# Patient Record
Sex: Female | Born: 1965 | Race: Black or African American | Hispanic: No | Smoking: Never smoker
Health system: Southern US, Community
[De-identification: ages and names within clinical notes are randomized; demographics above are authoritative.]

## PROBLEM LIST (undated history)

## (undated) DIAGNOSIS — D649 Anemia, unspecified: Secondary | ICD-10-CM

## (undated) DIAGNOSIS — M419 Scoliosis, unspecified: Secondary | ICD-10-CM

## (undated) DIAGNOSIS — O99119 Other diseases of the blood and blood-forming organs and certain disorders involving the immune mechanism complicating pregnancy, unspecified trimester: Secondary | ICD-10-CM

## (undated) DIAGNOSIS — O1490 Unspecified pre-eclampsia, unspecified trimester: Secondary | ICD-10-CM

## (undated) DIAGNOSIS — G510 Bell's palsy: Secondary | ICD-10-CM

## (undated) DIAGNOSIS — D6851 Activated protein C resistance: Secondary | ICD-10-CM

## (undated) DIAGNOSIS — O039 Complete or unspecified spontaneous abortion without complication: Secondary | ICD-10-CM

## (undated) HISTORY — PX: BACK SURGERY: SHX140

---
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
## 2010-08-18 ENCOUNTER — Other Ambulatory Visit: Payer: Self-pay | Admitting: Family Medicine

## 2010-08-18 DIAGNOSIS — Z1231 Encounter for screening mammogram for malignant neoplasm of breast: Secondary | ICD-10-CM

## 2010-08-31 ENCOUNTER — Ambulatory Visit (HOSPITAL_COMMUNITY)
Admission: RE | Admit: 2010-08-31 | Discharge: 2010-08-31 | Disposition: A | Discharge status: Home / Self Care | Payer: Self-pay | Source: Ambulatory Visit | Attending: Family Medicine | Admitting: Family Medicine

## 2010-08-31 DIAGNOSIS — Z1231 Encounter for screening mammogram for malignant neoplasm of breast: Secondary | ICD-10-CM

## 2010-09-09 ENCOUNTER — Other Ambulatory Visit: Payer: Self-pay | Admitting: Family Medicine

## 2010-09-09 DIAGNOSIS — R928 Other abnormal and inconclusive findings on diagnostic imaging of breast: Secondary | ICD-10-CM

## 2010-09-10 ENCOUNTER — Ambulatory Visit (HOSPITAL_COMMUNITY): Payer: Self-pay

## 2010-09-14 ENCOUNTER — Ambulatory Visit
Admission: RE | Admit: 2010-09-14 | Discharge: 2010-09-14 | Disposition: A | Discharge status: Home / Self Care | Payer: Self-pay | Source: Ambulatory Visit | Attending: Family Medicine | Admitting: Family Medicine

## 2010-09-14 DIAGNOSIS — R928 Other abnormal and inconclusive findings on diagnostic imaging of breast: Secondary | ICD-10-CM

## 2010-11-01 ENCOUNTER — Encounter: Payer: Self-pay | Admitting: Podiatry

## 2012-02-09 ENCOUNTER — Encounter (HOSPITAL_BASED_OUTPATIENT_CLINIC_OR_DEPARTMENT_OTHER): Payer: Self-pay | Admitting: Student

## 2012-02-09 ENCOUNTER — Emergency Department (HOSPITAL_BASED_OUTPATIENT_CLINIC_OR_DEPARTMENT_OTHER): Payer: Self-pay

## 2012-02-09 ENCOUNTER — Emergency Department (HOSPITAL_BASED_OUTPATIENT_CLINIC_OR_DEPARTMENT_OTHER)
Admission: EM | Admit: 2012-02-09 | Discharge: 2012-02-09 | Disposition: A | Discharge status: Home / Self Care | Payer: Self-pay | Attending: Emergency Medicine | Admitting: Emergency Medicine

## 2012-02-09 DIAGNOSIS — Z8739 Personal history of other diseases of the musculoskeletal system and connective tissue: Secondary | ICD-10-CM | POA: Insufficient documentation

## 2012-02-09 DIAGNOSIS — Z862 Personal history of diseases of the blood and blood-forming organs and certain disorders involving the immune mechanism: Secondary | ICD-10-CM | POA: Insufficient documentation

## 2012-02-09 DIAGNOSIS — H5789 Other specified disorders of eye and adnexa: Secondary | ICD-10-CM | POA: Insufficient documentation

## 2012-02-09 DIAGNOSIS — Q674 Other congenital deformities of skull, face and jaw: Secondary | ICD-10-CM | POA: Insufficient documentation

## 2012-02-09 DIAGNOSIS — G510 Bell's palsy: Secondary | ICD-10-CM

## 2012-02-09 DIAGNOSIS — G839 Paralytic syndrome, unspecified: Secondary | ICD-10-CM | POA: Insufficient documentation

## 2012-02-09 HISTORY — DX: Anemia, unspecified: D64.9

## 2012-02-09 HISTORY — DX: Activated protein C resistance: O99.119

## 2012-02-09 HISTORY — DX: Complete or unspecified spontaneous abortion without complication: O03.9

## 2012-02-09 HISTORY — DX: Activated protein C resistance: D68.51

## 2012-02-09 HISTORY — DX: Scoliosis, unspecified: M41.9

## 2012-02-09 LAB — CBC WITH DIFFERENTIAL/PLATELET
Basophils Relative: 0 % (ref 0–1)
HCT: 31.8 % — ABNORMAL LOW (ref 36.0–46.0)
Hemoglobin: 10.3 g/dL — ABNORMAL LOW (ref 12.0–15.0)
Lymphocytes Relative: 26 % (ref 12–46)
Lymphs Abs: 1.3 10*3/uL (ref 0.7–4.0)
MCHC: 32.4 g/dL (ref 30.0–36.0)
Monocytes Absolute: 0.5 10*3/uL (ref 0.1–1.0)
Monocytes Relative: 11 % (ref 3–12)
Neutro Abs: 2.9 10*3/uL (ref 1.7–7.7)
Neutrophils Relative %: 61 % (ref 43–77)
RBC: 3.85 MIL/uL — ABNORMAL LOW (ref 3.87–5.11)

## 2012-02-09 LAB — COMPREHENSIVE METABOLIC PANEL
Alkaline Phosphatase: 69 U/L (ref 39–117)
BUN: 13 mg/dL (ref 6–23)
CO2: 26 mEq/L (ref 19–32)
Chloride: 103 mEq/L (ref 96–112)
Creatinine, Ser: 0.6 mg/dL (ref 0.50–1.10)
GFR calc Af Amer: >90 mL/min (ref >90)
GFR calc non Af Amer: >90 mL/min (ref >90)
Glucose, Bld: 108 mg/dL — ABNORMAL HIGH (ref 70–99)
Potassium: 3.9 mEq/L (ref 3.5–5.1)
Total Bilirubin: 0.3 mg/dL (ref 0.3–1.2)

## 2012-02-09 LAB — URINALYSIS, ROUTINE W REFLEX MICROSCOPIC
Bilirubin Urine: NEGATIVE
Ketones, ur: NEGATIVE mg/dL
Leukocytes, UA: NEGATIVE
Nitrite: NEGATIVE
Protein, ur: NEGATIVE mg/dL
pH: 7.5 (ref 5.0–8.0)

## 2012-02-09 LAB — TROPONIN I: Troponin I: <0.3 ng/mL (ref <0.30)

## 2012-02-09 MED ORDER — VALACYCLOVIR HCL 500 MG PO TABS
1000.0000 mg | ORAL_TABLET | Freq: Once | ORAL | Status: DC
  Filled 2012-02-09: qty 2

## 2012-02-09 MED ORDER — PREDNISONE 50 MG PO TABS: ORAL_TABLET | ORAL | Status: DC

## 2012-02-09 MED ORDER — ARTIFICIAL TEARS OP OINT: TOPICAL_OINTMENT | OPHTHALMIC | Status: DC | PRN

## 2012-02-09 MED ORDER — VALACYCLOVIR HCL 1 G PO TABS: 1000.0000 mg | ORAL_TABLET | Freq: Three times a day (TID) | ORAL | Status: AC

## 2012-02-09 MED ORDER — ACYCLOVIR 200 MG PO CAPS
800.0000 mg | ORAL_CAPSULE | Freq: Once | ORAL | Status: AC
  Administered 2012-02-09: 800 mg via ORAL

## 2012-02-09 MED ORDER — ACYCLOVIR 200 MG PO CAPS
ORAL_CAPSULE | ORAL | Status: AC
  Administered 2012-02-09: 800 mg via ORAL
  Filled 2012-02-09: qty 4

## 2012-02-09 MED ORDER — PREDNISONE 10 MG PO TABS
60.0000 mg | ORAL_TABLET | Freq: Once | ORAL | Status: AC
  Administered 2012-02-09: 60 mg via ORAL
  Filled 2012-02-09: qty 1

## 2012-02-09 NOTE — ED Notes (Signed)
MD at bedside. 

## 2012-02-09 NOTE — ED Provider Notes (Signed)
History     CSN: [id number]  Arrival date & time 02/09/12  1018   First MD Initiated Contact with Patient 02/09/12 1035      Chief Complaint  Patient presents with  . Numbness    (Consider location/radiation/quality/duration/timing/severity/associated sxs/prior treatment) HPI Comments: Patient complains of excessive tearing from her left eye that started around 5 PM last night along with tingling of her upper lip on the left side and difficulty saying certain words. The last time she was normal was 5 PM last night. 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  Sheshe used heparin in the past with her pregnancies to prevent clotting. denies any chest pain, shortness of breath, nausea or vomiting. she denies any weakness, numbness or tingling in her arms or legs. No difficulty walking, no headache, no dizziness. No difficulty swallowing. She's has no troubles thinking about what she wants to say.   The history is provided by the patient.    Past Medical History  Diagnosis Date  . Scoliosis   . Miscarriage   . Factor V Leiden mutation complicating pregnancy   . Anemia     Past Surgical History  Procedure Date  . Harrington rod     History reviewed. No pertinent family history.  History  Substance Use Topics  . Smoking status: Never Smoker   . Smokeless tobacco: Not on file  . Alcohol Use: Yes    OB History    Grav Para Term Preterm Abortions TAB SAB Ect Mult Living                  Review of Systems  Constitutional: Negative for fever, activity change and appetite change.  HENT: Negative for trouble swallowing.   Eyes: Positive for discharge. Negative for visual disturbance.  Respiratory: Negative for apnea, cough, chest tightness and shortness of breath.   Cardiovascular: Negative for chest pain.  Gastrointestinal: Negative for nausea and vomiting.  Musculoskeletal: Negative for back pain.  Skin: Negative for rash.  Neurological:  Positive for facial asymmetry and numbness. Negative for light-headedness.    Allergies  Penicillins  Home Medications  No current outpatient prescriptions on file.  BP 118/72  Pulse 68  Temp 98.5 F (36.9 C) (Oral)  SpO2 100%  LMP 01/20/2012  Physical Exam  Constitutional: She is oriented to person, place, and time. She appears well-developed and well-nourished. No distress.  HENT:  Head: Normocephalic and atraumatic.  Mouth/Throat: Oropharynx is clear and moist. No oropharyngeal exudate.       Left-sided facial droop, asymmetrical smile, unable to close left eye, tongue mid unable to elevate forehead on L.   Eyes: Conjunctivae normal and EOM are normal. Pupils are equal, round, and reactive to light.  Neck: Normal range of motion. Neck supple.  Cardiovascular: Normal rate, regular rhythm and normal heart sounds.   No murmur heard. Pulmonary/Chest: Effort normal and breath sounds normal. No respiratory distress.  Abdominal: Soft. There is no tenderness. There is no rebound and no guarding.  Neurological: She is alert and oriented to person, place, and time. She exhibits normal muscle tone. Coordination normal.       Left-sided facial droop with nasolabial fold flattening, asymmetrical smile, tongue midline, unable to close left eye on left, unable to elevate eyebrow and left. Equal grip she is bilaterally, 5 out of 5 strength in the upper and lower charities. No ataxia finger to nose, no pronator drift, negative Romberg, visual fields full to confrontation bilaterally  Skin: Skin is warm.    ED Course  Procedures (including critical care time)  Labs Reviewed  CBC WITH DIFFERENTIAL - Abnormal; Notable for the following:    RBC 3.85 (*)     Hemoglobin 10.3 (*)     HCT 31.8 (*)     All other components within normal limits  COMPREHENSIVE METABOLIC PANEL - Abnormal; Notable for the following:    Glucose, Bld 108 (*)     All other components within normal limits  TROPONIN I    URINALYSIS, ROUTINE W REFLEX MICROSCOPIC   Ct Head Wo Contrast  02/09/2012  *RADIOLOGY REPORT*  Clinical Data: 46 year old female with numbness and changes and speech.  Generalized weakness.  Left eye blurred vision.  CT HEAD WITHOUT CONTRAST  Technique:  Contiguous axial images were obtained from the base of the skull through the vertex without contrast.  Comparison: None.  Findings: Visualized paranasal sinuses and mastoids are clear. Visualized orbits and scalp soft tissues are within normal limits. No osseous abnormality identified.  Suggestion of some age advanced cerebellar volume loss.  Cerebral volume appears within normal limits for age.  No ventriculomegaly. No midline shift, mass effect, or evidence of mass lesion.  No acute intracranial hemorrhage identified.  Gray-white matter differentiation is within normal limits throughout the brain.  No evidence of cortically based acute infarction identified.  No suspicious intracranial vascular hyperdensity.  IMPRESSION: Normal noncontrast CT appearance of the brain aside from suggestion of nonspecific cerebellar volume loss.   Original Report Authenticated By: Erskine Speed, M.D.      No diagnosis found.    MDM  Isolated left-sided facial droop with inability to close her elevate forehead. Consistent with Bell's palsy. No other neurological deficits.  Patient has Harrington rods and unable to obtain MRI.  CT negative. Labs unremarkable. Patient continues to have left sided facial weakness. Forehead not spared. Unable to close left eye. We'll treat for Bell's palsy with antivirals, steroids, eye care.    Date: 02/09/2012  Rate: 67  Rhythm: normal sinus rhythm  QRS Axis: normal  Intervals: normal  ST/T Wave abnormalities: normal  Conduction Disutrbances:none  Narrative Interpretation:   Old EKG Reviewed: none available       Glynn Octave, MD 02/09/12 1549

## 2012-02-09 NOTE — ED Notes (Signed)
Pt in with c/o watery eyes since last night along with lip tingling and reports some speech impediment along with some residual lip weakness. Denies headache but does report some generalized weakness since last night. Reports having issues with pregnancy in past and having clotting disorder, used heparin in past 2006/2007. Grips =, MAEW, no facial asymetry noted,. PERRLA.

## 2012-02-09 NOTE — ED Notes (Signed)
EKG upon arrival to room.

## 2012-02-11 IMAGING — MG MM DIGITAL DIAG LTD R
2 series · 2 of 2 positions shown · non-contrast
Comparison: 08/31/2010 and 01/30/2009 screening mammograms.

CLINICAL DATA: Recall from screening mammography.

DIGITAL DIAGNOSTIC RIGHT BREAST MAMMOGRAM WITH CAD AND THE RIGHT
BREAST ULTRASOUND:

[R XCCL]
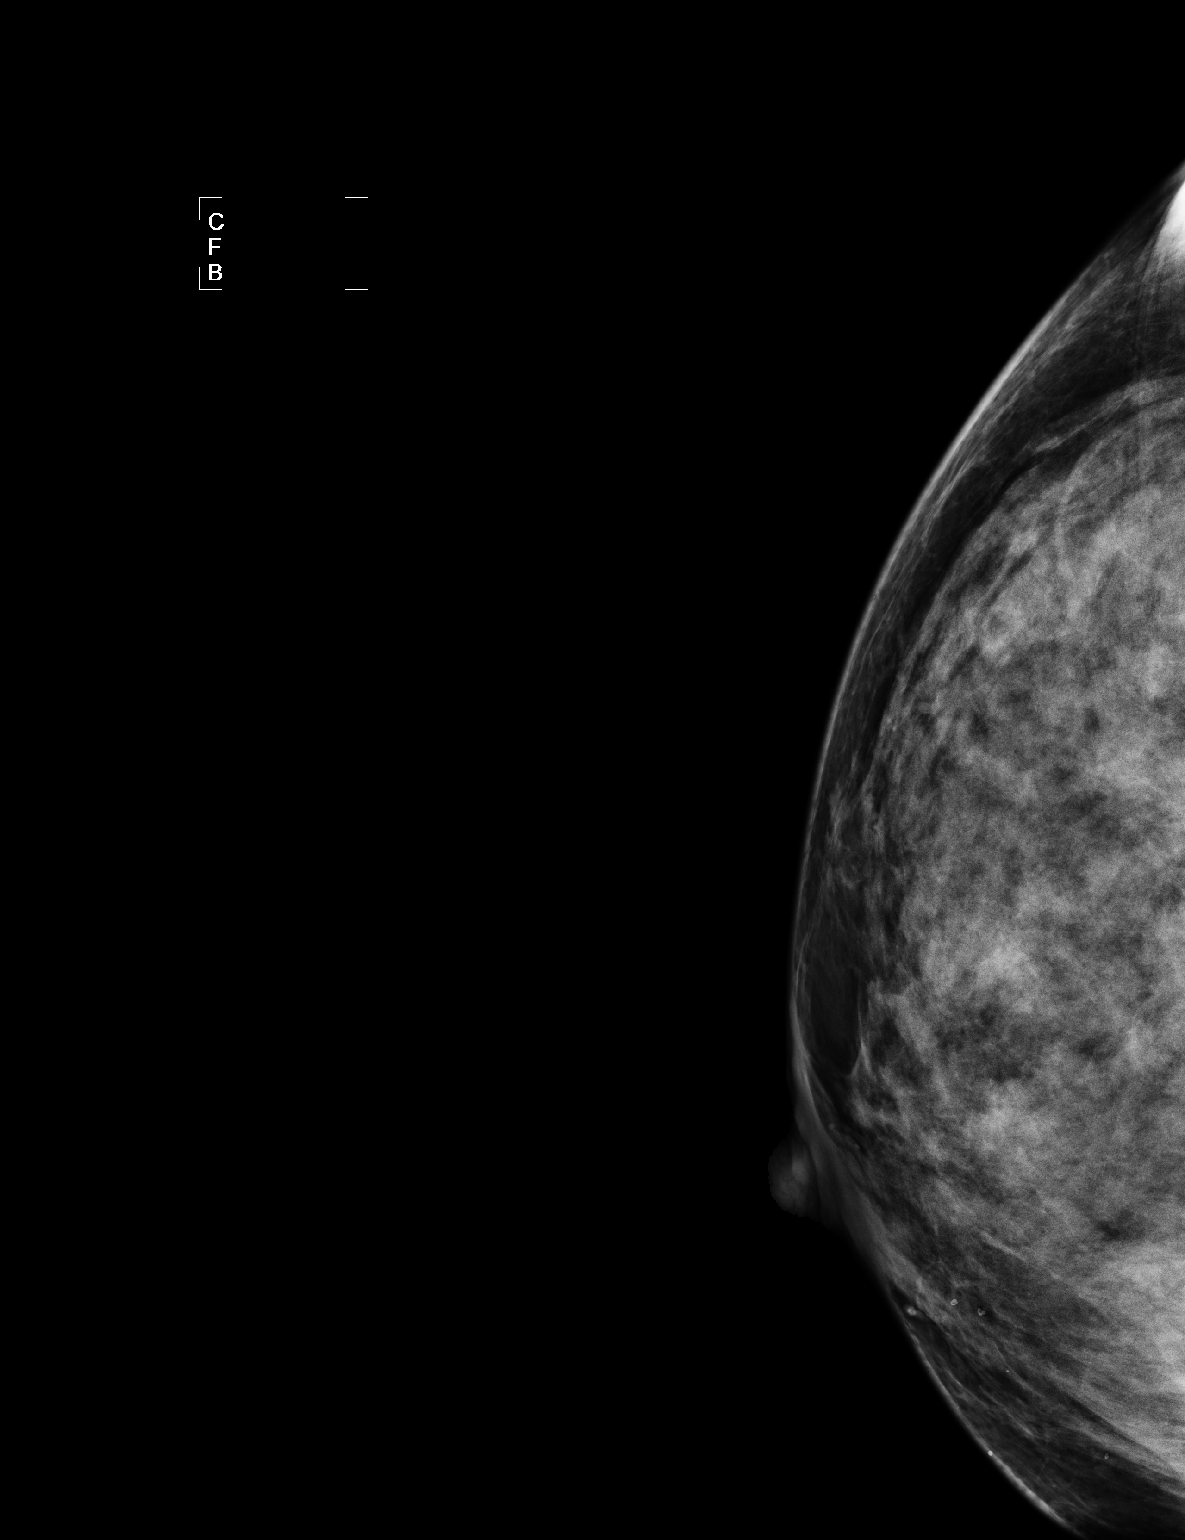

[R MLO]
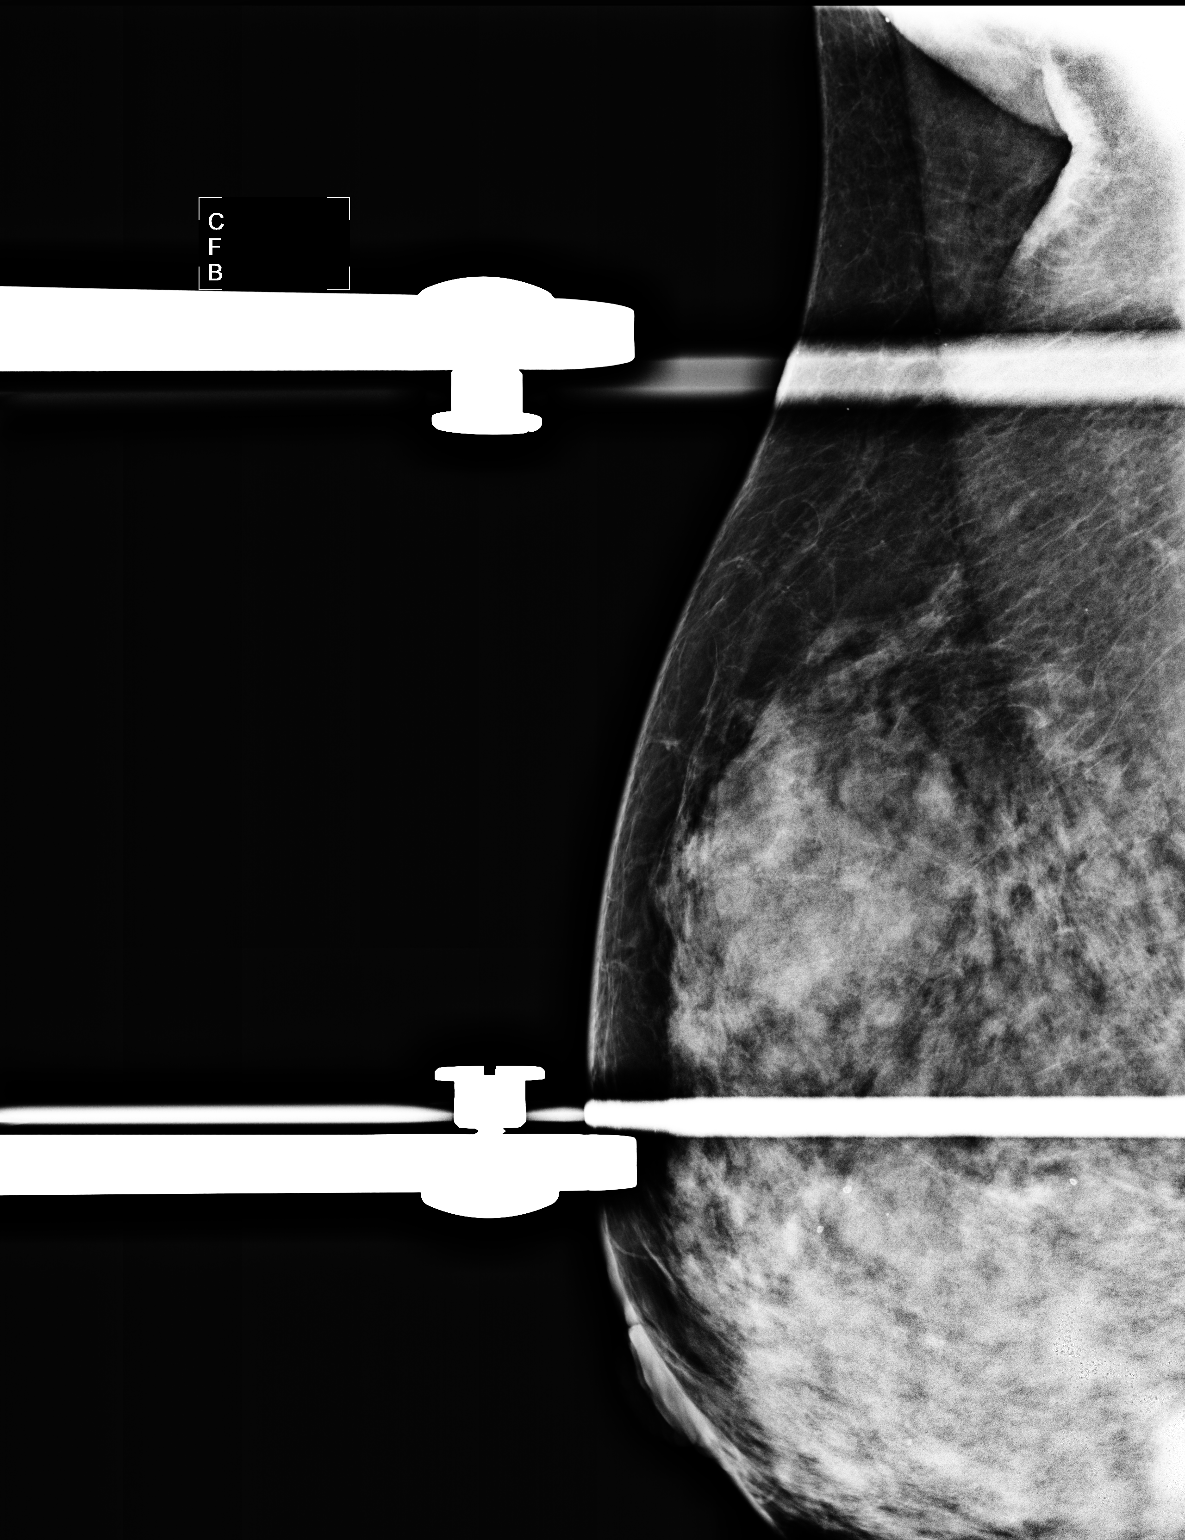

[2 of 2 positions shown; findings below may reference images not displayed]

FINDINGS: There is a heterogeneously dense right breast
parenchymal pattern.  On the additional views of the right breast
there is no persistent density / distortion.  The appearance is
most consistent with a summation shadow.
Mammographic images were processed with CAD.

On physical exam, there is no discrete palpable abnormality within
the superior or lateral right breast.

Ultrasound is performed, showing normal-appearing fibroglandular
tissue.  There is no mass, cyst, distortion, or worrisome
shadowing.
IMPRESSION: No persistent abnormality on additional evaluation of right breast.
The appearance is consistent with a summation shadow.  Recommend
screening mammography in 1 year.

BI-RADS CATEGORY 1:  Negative.

## 2012-02-13 ENCOUNTER — Encounter: Payer: Self-pay | Admitting: Internal Medicine

## 2012-02-13 ENCOUNTER — Ambulatory Visit (INDEPENDENT_AMBULATORY_CARE_PROVIDER_SITE_OTHER): Payer: Self-pay | Admitting: Internal Medicine

## 2012-02-13 VITALS — BP 128/72 | HR 68 | Temp 98.6°F | Resp 14 | Ht 66.0 in | Wt 158.8 lb

## 2012-02-13 DIAGNOSIS — R760 Raised antibody titer: Secondary | ICD-10-CM

## 2012-02-13 DIAGNOSIS — R894 Abnormal immunological findings in specimens from other organs, systems and tissues: Secondary | ICD-10-CM

## 2012-02-13 DIAGNOSIS — D259 Leiomyoma of uterus, unspecified: Secondary | ICD-10-CM | POA: Insufficient documentation

## 2012-02-13 DIAGNOSIS — M419 Scoliosis, unspecified: Secondary | ICD-10-CM | POA: Insufficient documentation

## 2012-02-13 DIAGNOSIS — M4120 Other idiopathic scoliosis, site unspecified: Secondary | ICD-10-CM

## 2012-02-13 DIAGNOSIS — D649 Anemia, unspecified: Secondary | ICD-10-CM | POA: Insufficient documentation

## 2012-02-13 DIAGNOSIS — G510 Bell's palsy: Secondary | ICD-10-CM

## 2012-02-13 DIAGNOSIS — D6862 Lupus anticoagulant syndrome: Secondary | ICD-10-CM

## 2012-02-13 HISTORY — DX: Raised antibody titer: R76.0

## 2012-02-13 HISTORY — DX: Lupus anticoagulant syndrome: D68.62

## 2012-02-13 HISTORY — DX: Leiomyoma of uterus, unspecified: D25.9

## 2012-02-13 MED ORDER — PREDNISONE 20 MG PO TABS: ORAL_TABLET | ORAL | Status: AC

## 2012-02-13 NOTE — Progress Notes (Signed)
  Subjective:    Patient ID: Jenny Wright, female    DOB: 1965/05/10, 46 y.o.   MRN: [id number]  HPI patient presents to clinic for emergency department followup of possible Bell's palsy. Seen in the emergency department November 21 with one day history of left eye tearing, inability to close left eye fully and the left facial droop. Had no other focal neurologic deficit. Underwent unremarkable head CT. Diagnosed with Bell's palsy and placed on artificial tears, Valtrex for 10 days and five-day course of prednisone. Symptoms are stable without worsening. No recent illness.  Reviewed emergency department labs including urinalysis, Chem-7 and CBC significant for hemoglobin approximately 10.3. Patient relates history of anemia with intermittently heavy periods. States has taken iron supplementation the past but not recently. Also notes history of possible antiphospholipid syndrome during pregnancy. Was recommended for heparin during pregnancy. Diagnosis was made after laboratory evaluation status post single miscarriage. No history of known clot.  History of scoliosis with Harrington rod placement remotely. Was post 43.  PMH: anemia, ?anti phosolipid, scoliosis Past Surgical History  Procedure Date  . Harrington rod     reports that she has never smoked. She does not have any smokeless tobacco history on file. She reports that she drinks alcohol. Her drug history not on file. family history includes Breast cancer in her mother; Heart disease in her father; and Lung cancer in her brother. Allergies  Allergen Reactions  . Penicillins      Review of Systems  Neurological: Positive for facial asymmetry. Negative for speech difficulty and weakness.  All other systems reviewed and are negative.       Objective:   Physical Exam  Nursing note and vitals reviewed. Constitutional: She appears well-developed and well-nourished. No distress.  HENT:  Head: Normocephalic and atraumatic.  Right  Ear: Tympanic membrane, external ear and ear canal normal.  Left Ear: Tympanic membrane, external ear and ear canal normal.  Eyes: Conjunctivae normal and EOM are normal. Pupils are equal, round, and reactive to light. Right eye exhibits no discharge. Left eye exhibits no discharge. No scleral icterus.  Neck: Neck supple.  Neurological: She is alert. Coordination normal.       Unable to perform partial closing of left eyelid. Left facial droop noted. Tongue midline. Bilateral upper and lower extremity strength 5/5. Gait normal. Light sensation intact bilateral face.  Skin: Skin is warm and dry. No rash noted. She is not diaphoretic.  Psychiatric: She has a normal mood and affect.          Assessment & Plan:

## 2012-02-13 NOTE — Assessment & Plan Note (Signed)
Asx. Pt states past anemia with similar hgb values. Begin feso4 325mg  bid. Discuss f/u cbc with ferritin at future visit

## 2012-02-13 NOTE — Assessment & Plan Note (Signed)
Discussed possible hematology consult in future for more definitive dx.

## 2012-02-13 NOTE — Assessment & Plan Note (Signed)
Presentation and exam c/w dx. Head ct unremarkable. Continue valtrex. Extend prednisone with taper. Schedule close f/u in two weeks or sooner if needed.

## 2012-02-22 ENCOUNTER — Other Ambulatory Visit: Payer: Self-pay | Admitting: Internal Medicine

## 2012-02-22 ENCOUNTER — Telehealth: Payer: Self-pay | Admitting: *Deleted

## 2012-02-22 DIAGNOSIS — G510 Bell's palsy: Secondary | ICD-10-CM

## 2012-02-22 NOTE — Telephone Encounter (Signed)
Pt is very anxious to get in w/Neurology, please place referral order/SLS

## 2012-02-22 NOTE — Telephone Encounter (Signed)
Pt reports that her Bell's Palsy is still causing her" mouth to be twisted & eye is not blinking", pt has f/u OV next week; would like to know if there should be extension of anti-viral until then & also does she need to be set up for therapy/SLS Please advise.

## 2012-02-22 NOTE — Telephone Encounter (Signed)
Doubt more anti-viral would help. Honestly would suggest neurology consult -second opinion to review case. Most people improve within several weeks. See if willing

## 2012-02-22 NOTE — Telephone Encounter (Signed)
Referral order placed.

## 2012-02-24 ENCOUNTER — Telehealth: Payer: Self-pay | Admitting: *Deleted

## 2012-02-24 ENCOUNTER — Encounter: Payer: Self-pay | Admitting: Neurology

## 2012-02-24 ENCOUNTER — Ambulatory Visit: Payer: Self-pay | Admitting: Neurology

## 2012-02-24 VITALS — BP 110/70 | HR 76 | Temp 98.4°F | Resp 16 | Ht 67.0 in | Wt 159.0 lb

## 2012-02-24 DIAGNOSIS — G510 Bell's palsy: Secondary | ICD-10-CM

## 2012-02-24 MED ORDER — METHYLPREDNISOLONE 4 MG PO KIT: PACK | ORAL | Status: DC

## 2012-02-24 NOTE — Progress Notes (Signed)
Jenny Wright is a 46 year old mother of one who is now referred by Dr. Vertell Novak, Junior., M.D. For further evaluation of presumed Bell's palsy. On 3 states that on November 20 she was at work and she noticed hearing of her high excessively on the left. The following day she realized she couldn't enunciate her words or whistle. At that time she had no pain. She also noticed that sounds were allowed in the left ear although she did not notice a change in taste on her tongue. On November 21 she went to the emergency department where she was evaluated area and she had laboratory testing and a CAT scan of the head which were unremarkable. She was diagnosed as having else palsy. She was advised to use Lacri-Lube and artificial tears at night. She was also given Valtrex for 10 days and she was given prednisone for about 6 days. When she followed up with Dr. Secundino Ginger, she was given an additional 5 or 6 days of prednisone taper. She has not seen significant improvement in the movement of the left side of her face. She finished the last dosage of prednisone about 4 or 5 days ago. Last night she had pain behind the left year it was worse at night and has improved since she got up and start moving around this morning. She is also having some frontal headaches which have actually increased in the last 4 months and they respond to Advil and she doesn't have definite migraine features such as light sensitivity or nausea.  At this time she denies dizziness, double vision, change in appetite, increased difficulty sleeping (she has some difficulty falling asleep at night already), or any constitutional symptoms. Her 14 point review of systems is negative other than the facial pain and headaches and tension in the shoulders and trapezius muscles with stress. She denies any prior neurological history other than occasional headaches that are worse in the last 4 months.  Past medical history miscarriage x1 in the first trimester and  positive antiphospholipid antibody test. She has subsequently had delivery of a normal child who is now 5 years of age. Uterine fibroids Heavy periods and anemia and was taking iron in the past Scoliosis with placement of Harrington rod surgery which was successful.  Medications Advil p.r.n. Finished approximately 10-12 days of prednisone for 5 days ago and for 10 days of Valtrex recently for this problem  Allergies: Penicillin  Social history: The patient has a drink on a social occasion she has never smoked or used other illicit drugs. 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 She is a single mother and not receiving any child support and she has full custody of her 46-year-old son and she has been divorced about 5 years.  Family history: Mother had breast cancer that recurred and she died at age 33. Her father died about a month later of a broken heart One brother has lung cancer and her other brother is healthy Her son is in good health.  Alert and oriented x 3.  Memory function appears to be intact.  Concentration and attention are normal for educational level and background.  Speech is fluent except mild enunciation difficulty and without significant word finding difficulty.  Is aware of current events.  No carotid bruits detected.  normal optic discs and acuity, EOMI, PERLA, facial movement is absent on the left except for 90% normal eye cloure, facial sensation intact, hearing grossly intact, gag intact,Uvula raises  symmetrically and tongue protrudes evenly. Motor strength is 5 over 5 throughout all limbs Reflexes are32+ and symmetric in the upper and lower extremities Sensory exam is intact. Coordination is intact fo rfine movements and rapid alternating movements in all limbs Gait and station are normal.   Assessment: 46 year old woman with  Severe case  of left Bell's palsy in terms of no evidence of significant motor movement  within the distribution of the left facial nerve  However it is early in the course and the prognosis is still good for recovery. The fact that she is having pain behind the left ear at night concerns me as this is evidence of facial swelling which is probably causing further damage to the facial nerve.  A CAT scan was normal and she denies other neurological symptoms.  Therefore, everything is very consistent with uncomplicated Bell's palsy at this time.  Plan: We will prescribe an additional 6 day Medrol pack do to the complaint of pain behind the left year indicating significant swelling in the left facial nerve that warrants treatment. She will continue to use Lacri-Lube and artificial tears at night. She can use an eye patch if her eyes not causing fully her she is having significant dryer scratchiness of the eye in the morning. She will avoid gluten for the next 2-3 weeks Return here in 2 weeks to see if there is any evidence of recovery She asked acupuncture would be helpful and I indicated that if acupuncture is going to help [place] better to start before 2 months to get the maximum opportunity for benefit. I also gave her some homework in terms of looking into where she is experiencing stress in her life and some suggestions as to how to recognize this. She can use Advil p.r.n. For the frontal headaches. We will evaluate the headaches further at the time of her followup in 2 weeks.

## 2012-02-24 NOTE — Patient Instructions (Addendum)
1.  Take the 6 day dose pack 2.  Use advil prn for headache or discomfort. 3.  Avoid wheat flour for 2-3 weeks 4. Continue lacrilube and artificial tears at night and use eye patch if needed. 5. Return in 2 weeks.

## 2012-02-24 NOTE — Telephone Encounter (Signed)
Pt called reporting that she had an "unusual experience at [facility] [Dr. Soo] and was wanting to know if Dr. Rodena Medin knew this physician personally [as to why she was sent directly to this MD]; explained our referral process to patient. Dr. Smiley Houseman, according to patient, questioned her "spirituality & connection with God" and implied that she "does not know her body & may need to change her ways to reconnect spiritually to regain her health" and was very "holistic in his methods with her" and she was not made aware of physician being a holistic treatment provider.  Pt reports she was made to feel like she "wanted to abuse traditional treatments [medications], and did not want Dr. Rodena Medin to feel that she had tried to make him feel that she was "pushy about getting Rx[s]"; explained to her that we were Very Sorry that she had had this experience and we did not want her to think that we had given Neurology any of the "bad impressions" that she may have been made to feel like were directed toward herself. Informed her that I had already spoken to our [facility], while placing pt on hold] regarding this matter and that we would get patient in to see a new Neurologist, outside of [place], at pt request. Also, informed her that we would call her back on Monday, 12.09.13, with the name & phone number for the Director for Neurology [could not locate during phone conversation], in case she wanted to report this matter to management Francis Dowse is undecided on this right now], but is upset about the amount of money it cost, as she is self-pay. Please call pt with this information [director] and place referral for new Neurology consult [outside [place]]/SLS

## 2012-02-25 ENCOUNTER — Other Ambulatory Visit: Payer: Self-pay | Admitting: Internal Medicine

## 2012-02-25 DIAGNOSIS — G510 Bell's palsy: Secondary | ICD-10-CM

## 2012-02-29 ENCOUNTER — Ambulatory Visit (INDEPENDENT_AMBULATORY_CARE_PROVIDER_SITE_OTHER): Payer: Self-pay | Admitting: Internal Medicine

## 2012-02-29 ENCOUNTER — Encounter: Payer: Self-pay | Admitting: Internal Medicine

## 2012-02-29 VITALS — BP 110/70 | HR 63 | Temp 98.0°F | Resp 16 | Ht 66.0 in | Wt 158.1 lb

## 2012-02-29 DIAGNOSIS — G510 Bell's palsy: Secondary | ICD-10-CM

## 2012-02-29 DIAGNOSIS — D649 Anemia, unspecified: Secondary | ICD-10-CM

## 2012-03-01 ENCOUNTER — Telehealth: Payer: Self-pay | Admitting: *Deleted

## 2012-03-01 NOTE — Telephone Encounter (Signed)
Jenny Wright - Neurology referral ','[facility] Detail >>       Neurology referral     Jenny Wright       Sent:  Thu March 01, 2012 9:44 AM   To:  Kathi Simpers, CMA                 Message     Received call from [facility] they will call pt today to schedule I will follow up with pt around noon

## 2012-03-01 NOTE — Telephone Encounter (Signed)
Pt called stating she is starting to have tingling in the right side of her face (forehead and jaw). She is concerned because of her recent diagnosis of Bell's Palsy affecting the left side of her face. Spoke with Provider and advised her if symptoms persist worsen or she develops additional symptoms she should be evaluated in the ER. Advised pt we would follow up on neurology referral to help expedite process. Info has been faxed and message has been left with referral coordinator today. Pt voices understanding.

## 2012-03-01 NOTE — Telephone Encounter (Signed)
Received notice from [facility] that she spoke with pt and pt wants to be referred to neurologist in [place]. She states that pt is calling to arrange appt. Herself.

## 2012-03-03 NOTE — Assessment & Plan Note (Signed)
Interested in second opinion. Neurology consult pending.

## 2012-03-03 NOTE — Progress Notes (Signed)
  Subjective:    Patient ID: Jenny Wright, female    DOB: 1965/12/18, 46 y.o.   MRN: [id number]  HPI Pt returns to clinic for f/u of facial droop. Carries clinical diagnosis of Bell's palsy by ED and recent neurologist. S/p course of steroids and anti-viral. Neurology extended steroid course. Has demonstrated no improvement of left facial droop or left eye closing. No limb weakness. Underwent head ct at ED without acute finding. Reviewed h/o mild anemia now taking feso4 daily.   Past Medical History  Diagnosis Date  . Scoliosis   . Miscarriage   . Factor V Leiden mutation complicating pregnancy   . Anemia    Past Surgical History  Procedure Date  . Harrington rod 1980    reports that she has never smoked. She does not have any smokeless tobacco history on file. She reports that she drinks alcohol. She reports that she does not use illicit drugs. family history includes Breast cancer in her mother; Heart disease in her father; and Lung cancer in her brother. Allergies  Allergen Reactions  . Penicillins      Review of Systems see hpi     Objective:   Physical Exam  Nursing note and vitals reviewed. Constitutional: She appears well-developed and well-nourished. No distress.  HENT:  Head: Normocephalic and atraumatic.  Eyes: Conjunctivae normal are normal. Right eye exhibits no discharge. Left eye exhibits no discharge. No scleral icterus.  Neurological: She is alert.       Left facial droop. Inability to fully shut left eye. Both findings stable from previous exam.  Skin: She is not diaphoretic.  Psychiatric: She has a normal mood and affect.          Assessment & Plan:

## 2012-03-03 NOTE — Assessment & Plan Note (Signed)
Continue feso4. Recheck cbc with ferritin prior to next visit.

## 2012-03-09 ENCOUNTER — Ambulatory Visit: Payer: Self-pay | Admitting: Neurology

## 2012-04-27 ENCOUNTER — Ambulatory Visit: Payer: Self-pay | Admitting: Internal Medicine

## 2012-08-27 ENCOUNTER — Telehealth: Payer: Self-pay | Admitting: Internal Medicine

## 2012-08-27 DIAGNOSIS — G510 Bell's palsy: Secondary | ICD-10-CM

## 2012-08-27 NOTE — Telephone Encounter (Signed)
Left detailed message on cell# re: status and to call if she hasn't received appt by the end of the week.

## 2012-08-27 NOTE — Telephone Encounter (Signed)
Patient states that Dr. Rodena Medin has seen her regarding Bells Palsy. She would like a referral to [facility], Pilar Jarvis to see if he can help her with her facial expressions

## 2012-08-27 NOTE — Telephone Encounter (Signed)
Will arrange. Jenny Wright will call her.

## 2012-08-28 ENCOUNTER — Ambulatory Visit (INDEPENDENT_AMBULATORY_CARE_PROVIDER_SITE_OTHER): Payer: No Typology Code available for payment source | Admitting: Family

## 2012-08-28 ENCOUNTER — Encounter: Payer: Self-pay | Admitting: Family

## 2012-08-28 VITALS — BP 118/80 | HR 64 | Temp 98.1°F | Resp 16 | Wt 161.1 lb

## 2012-08-28 DIAGNOSIS — R21 Rash and other nonspecific skin eruption: Secondary | ICD-10-CM

## 2012-08-28 DIAGNOSIS — B353 Tinea pedis: Secondary | ICD-10-CM

## 2012-08-28 DIAGNOSIS — B351 Tinea unguium: Secondary | ICD-10-CM

## 2012-08-28 MED ORDER — ECONAZOLE NITRATE 1 % EX CREA: TOPICAL_CREAM | Freq: Every day | CUTANEOUS | Status: DC

## 2012-08-28 MED ORDER — BETAMETHASONE DIPROPIONATE 0.05 % EX CREA: TOPICAL_CREAM | CUTANEOUS | Status: DC

## 2012-08-28 NOTE — Progress Notes (Signed)
Subjective:    Patient ID: Jenny Wright, female    DOB: 1965-06-02, 47 y.o.   MRN: [id number]  HPI  Jenny Wright is a 47 yr old female who presents today with chief complaint of skin infection  She reports thickened toenails and dry peeling skin on both feet. She has tried otc tinactin without improvement.  She reports that in the past the only thing that has helped is econazole cream.    Also notes rash on her anterior chest.  She reports a grease burn on her anterior chest 1 month ago. Since that time she reports itching at the site.   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  She has requested referral to a physical therapist to help with facial paralysis.    Review of Systems See HPI  Past Medical History  Diagnosis Date  . Scoliosis   . Miscarriage   . Factor V Leiden mutation complicating pregnancy   . Anemia     History   Social History  . Marital Status: Divorced    Spouse Name: N/A    Number of Children: N/A  . Years of Education: N/A   Occupational History  . Not on file.   Social History Main Topics  . Smoking status: Never Smoker   . Smokeless tobacco: Not on file  . Alcohol Use: Yes     Comment: socially  . Drug Use: No  . Sexually Active: Not on file   Other Topics Concern  . Not on file   Social History Narrative  . No narrative on file    Past Surgical History  Procedure Laterality Date  . Harrington rod  1980    Family History  Problem Relation Age of Onset  . Breast cancer Mother     deceased  . Heart disease Father     deceased  . Lung cancer Brother     deceased    Allergies  Allergen Reactions  . Penicillins     Current Outpatient Prescriptions on File Prior to Visit  Medication Sig Dispense Refill  . artificial tears (LACRILUBE) OINT ophthalmic ointment Apply to eye every 4 (four) hours as needed.  3.5 g  0  . ferrous sulfate 325 (65 FE) MG tablet  Take 325 mg by mouth daily with breakfast.      . Multiple Vitamin (MULTIVITAMIN) capsule Take 1 capsule by mouth daily.       No current facility-administered medications on file prior to visit.    BP 118/80  Pulse 64  Temp(Src) 98.1 F (36.7 C) (Oral)  Resp 16  Wt 161 lb 1.3 oz (73.065 kg)  BMI 26.01 kg/m2  SpO2 99%  LMP 08/07/2012       Objective:   Physical Exam  Constitutional: She is oriented to person, place, and time. She appears well-developed and well-nourished. No distress.  Cardiovascular: Normal rate and regular rhythm.   No murmur heard. Pulmonary/Chest: Effort normal and breath sounds normal. No respiratory distress. She has no wheezes. She has no rales. She exhibits no tenderness.  Neurological: She is alert and oriented to person, place, and time.  Skin:  Hypertrophic left 2nd toenail, dry peeling skin noted bilateral plantar surfaces.  Psychiatric: She has a normal mood and affect. Her behavior is normal. Judgment and thought content normal.  left anterior chest- some dry scaling skin noted.         Assessment & Plan:

## 2012-08-28 NOTE — Patient Instructions (Addendum)
You will be contacted about your referral to podiatry.  Please let us know if you have not heard back within 1 week about your referral.

## 2012-08-29 DIAGNOSIS — B351 Tinea unguium: Secondary | ICD-10-CM

## 2012-08-29 DIAGNOSIS — B353 Tinea pedis: Secondary | ICD-10-CM

## 2012-08-29 DIAGNOSIS — R21 Rash and other nonspecific skin eruption: Secondary | ICD-10-CM

## 2012-08-29 HISTORY — DX: Tinea unguium: B35.1

## 2012-08-29 HISTORY — DX: Tinea pedis: B35.3

## 2012-08-29 HISTORY — DX: Rash and other nonspecific skin eruption: R21

## 2012-08-29 NOTE — Assessment & Plan Note (Signed)
Eczematous type rash noted on left anterior chest.  Recommended trial of betamethasone.

## 2012-08-29 NOTE — Assessment & Plan Note (Signed)
She is requesting referral to podiatry.  Referral placed.

## 2012-08-29 NOTE — Assessment & Plan Note (Signed)
rx provided for econazole.

## 2012-09-18 ENCOUNTER — Other Ambulatory Visit: Payer: Self-pay | Admitting: Obstetrics and Gynecology

## 2012-09-18 DIAGNOSIS — N63 Unspecified lump in unspecified breast: Secondary | ICD-10-CM

## 2012-09-28 ENCOUNTER — Other Ambulatory Visit: Payer: No Typology Code available for payment source

## 2013-01-21 ENCOUNTER — Telehealth: Payer: Self-pay | Admitting: *Deleted

## 2013-01-21 NOTE — Telephone Encounter (Signed)
Lesions on mouth/pubic area could be related to herpes infection. We can perform blood test to check this.  Fungal infection likely unrelated. She can schedule OV to further evaluate if she would like.

## 2013-01-21 NOTE — Telephone Encounter (Signed)
Received call from pt stating she has had intermittent lesions in her mouth x 4 months. Prior to this she states she had a lesion in her pubic area x 3 months. Pt also states she has been seeing a foot specialist for a fungal infection on her toenail. Pt has history of Bell's Palsy and continues to have facial drooping. Reports that she has seen a couple of neurologists but is wondering if the lesions and fungal infection she has could be some type of viral syndrome? Has read that this syndrome can be mistaken for Bell's Palsy. Doesn't currently have any lesions at present.  Please advise if symptoms can be evaluated without lesions present or if further instructions?

## 2013-01-22 NOTE — Telephone Encounter (Signed)
Notified pt. 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 Thought she could come in for blood tests only as her podiatrist is recommending her to complete liver function testing prior to starting her on anti-fungal medication.  Advised pt she needs office visit first and can complete labs after they are ordered at visit; would still be one trip. Pt states lab testing will be expensive and she wants to check other community resources for testing before scheduling appt with. Pt states she will call back.

## 2013-01-23 NOTE — Telephone Encounter (Signed)
Noted  

## 2013-02-01 ENCOUNTER — Encounter: Payer: Self-pay | Admitting: Family Medicine

## 2013-02-01 ENCOUNTER — Ambulatory Visit (INDEPENDENT_AMBULATORY_CARE_PROVIDER_SITE_OTHER): Payer: No Typology Code available for payment source | Admitting: Family Medicine

## 2013-02-01 VITALS — BP 115/70 | HR 68 | Temp 96.9°F | Ht 63.0 in | Wt 161.4 lb

## 2013-02-01 DIAGNOSIS — A6000 Herpesviral infection of urogenital system, unspecified: Secondary | ICD-10-CM

## 2013-02-01 DIAGNOSIS — R894 Abnormal immunological findings in specimens from other organs, systems and tissues: Secondary | ICD-10-CM

## 2013-02-01 DIAGNOSIS — B009 Herpesviral infection, unspecified: Secondary | ICD-10-CM

## 2013-02-01 DIAGNOSIS — R768 Other specified abnormal immunological findings in serum: Secondary | ICD-10-CM

## 2013-02-01 HISTORY — DX: Herpesviral infection, unspecified: B00.9

## 2013-02-01 MED ORDER — ACYCLOVIR 400 MG PO TABS: 400.0000 mg | ORAL_TABLET | Freq: Three times a day (TID) | ORAL | Status: DC

## 2013-02-01 NOTE — Patient Instructions (Signed)
Bell's Palsy Bell's palsy is a condition in which the muscles on one side of the face cannot move (paralysis). This is because the nerves in the face are paralyzed. It is most often thought to be caused by a virus. The virus causes swelling of the nerve that controls movement on one side of the face. The nerve travels through a tight space surrounded by bone. When the nerve swells, it can be compressed by the bone. This results in damage to the protective covering around the nerve. This damage interferes with how the nerve communicates with the muscles of the face. As a result, it can cause weakness or paralysis of the facial muscles.  Injury (trauma), tumor, and surgery may cause Bell's palsy, but most of the time the cause is unknown. It is a relatively common condition. It starts suddenly (abrupt onset) with the paralysis usually ending within 2 days. Bell's palsy is not dangerous. But because the eye does not close properly, you may need care to keep the eye from getting dry. This can include splinting (to keep the eye shut) or moistening with artificial tears. Bell's palsy very seldom occurs on both sides of the face at the same time. SYMPTOMS   Eyebrow sagging.  Drooping of the eyelid and corner of the mouth.  Inability to close one eye.  Loss of taste on the front of the tongue.  Sensitivity to loud noises. TREATMENT  The treatment is usually non-surgical. If the patient is seen within the first 24 to 48 hours, a short course of steroids may be prescribed, in an attempt to shorten the length of the condition. Antiviral medicines may also be used with the steroids, but it is unclear if they are helpful.  You will need to protect your eye, if you cannot close it. The cornea (clear covering over your eye) will become dry and can be damaged. Artificial tears can be used to keep your eye moist. Glasses or an eye patch should be worn to protect your eye. PROGNOSIS  Recovery is variable, ranging  from days to months. Although the problem usually goes away completely (about 80% of cases resolve), predicting the outcome is impossible. Most people improve within 3 weeks of when the symptoms began. Improvement may continue for 3 to 6 months. A small number of people have moderate to severe weakness that is permanent.  HOME CARE INSTRUCTIONS   If your caregiver prescribed medication to reduce swelling in the nerve, use as directed. Do not stop taking the medication unless directed by your caregiver.  Use moisturizing eye drops as needed to prevent drying of your eye, as directed by your caregiver.  Protect your eye, as directed by your caregiver.  Use facial massage and exercises, as directed by your caregiver.  Perform your normal activities, and get your normal rest. SEEK IMMEDIATE MEDICAL CARE IF:   There is pain, redness or irritation in the eye.  You or your child has an oral temperature above 102 F (38.9 C), not controlled by medicine. MAKE SURE YOU:   Understand these instructions.  Will watch your condition.  Will get help right away if you are not doing well or get worse. Document Released: 03/07/2005 Document Revised: 05/30/2011 Document Reviewed: 03/16/2009 [facility] Patient Information 2014 [place], [place]. Cold Sore A cold sore (fever blister) is a skin infection caused by the herpes simplex virus (HSV-1). HSV-1 is closely related to the virus that causes gential herpes (HSV-2), but they are not the same even though  both viruses can cause oral and genital infections. Cold sores are small, fluid-filled sores inside of the mouth or on the lips, gums, nose, chin, cheeks, or fingers.  The herpes simplex virus can be easily passed (contagious) to other people through close personal contact, such as kissing or sharing personal items. The virus can also spread to other parts of the body, such as the eyes or genitals. Cold sores are contagious until the sores crust over  completely. They often heal within 2 weeks.  Once a person is infected, the herpes simplex virus remains permanently in the body. Therefore, there is no cure for cold sores, and they often recur when a person is tired, stressed, sick, or gets too much sun. Additional factors that can cause a recurrence include hormone changes in menstruation or pregnancy, certain drugs, and cold weather.  CAUSES  Cold sores are caused by the herpes simplex virus. The virus is spread from person to person through close contact, such as through kissing, touching the affected area, or sharing personal items such as lip balm, razors, or eating utensils.  SYMPTOMS  The first infection may not cause symptoms. If symptoms develop, the symptoms often go through different stages. Here is how a cold sore develops:   Tingling, itching, or burning is felt 1 2 days before the outbreak.   Fluid-filled blisters appear on the lips, inside the mouth, nose, or on the cheeks.   The blisters start to ooze clear fluid.   The blisters dry up and a yellow crust appears in its place.   The crust falls off.  Symptoms depend on whether it is the initial outbreak or a recurrence. Some other symptoms with the first outbreak may include:   Fever.   Sore throat.   Headache.   Muscle aches.   Swollen neck glands.  DIAGNOSIS  A diagnosis is often made based on your symptoms and looking at the sores. Sometimes, a sore may be swabbed and then examined in the lab to make a final diagnosis. If the sores are not present, blood tests can find the herpes simplex virus.  TREATMENT  There is no cure for cold sores and no vaccine for the herpes simplex virus. Within 2 weeks, most cold sores go away on their own without treatment. Medicines cannot make the infection go away, but medicine can help relieve some of the pain associated with the sores, can work to stop the virus from multiplying, and can also shorten healing time. Medicine  may be in the form of creams, gels, pills, or a shot.  HOME CARE INSTRUCTIONS   Only take over-the-counter or prescription medicines for pain, discomfort, or fever as directed by your caregiver. Do not use aspirin.   Use a cotton-tip swab to apply creams or gels to your sores.   Do not touch the sores or pick the scabs. Wash your hands often. Do not touch your eyes without washing your hands first.   Avoid kissing, oral sex, and sharing personal items until sores heal.   Apply an ice pack on your sores for 10 15 minutes to ease any discomfort.   Avoid hot, cold, or salty foods because they may hurt your mouth. Eat a soft, bland diet to avoid irritating the sores. Use a straw to drink if you have pain when drinking out of a glass.   Keep sores clean and dry to prevent an infection of other tissues.   Avoid the sun and limit stress if these things  trigger outbreaks. If sun causes cold sores, apply sunscreen on the lips before being out in the sun.  SEEK MEDICAL CARE IF:   You have a fever or persistent symptoms for more than 2 3 days.   You have a fever and your symptoms suddenly get worse.   You have pus, not clear fluid, coming from the sores.   You have redness that is spreading.   You have pain or irritation in your eye.   You get sores on your genitals.   Your sores do not heal within 2 weeks.   You have a weakened immune system.   You have frequent recurrences of cold sores.  MAKE SURE YOU:   Understand these instructions.  Will watch your condition.  Will get help right away if you are not doing well or get worse. Document Released: 03/04/2000 Document Revised: 11/30/2011 Document Reviewed: 07/20/2011 [facility] Patient Information 2014 [place], [place]. Genital Herpes Genital herpes is a sexually transmitted disease. This means that it is a disease passed by having sex with an infected person. There is no cure for genital herpes. The time between  attacks can be months to years. The virus may live in a person but produce no problems (symptoms). This infection can be passed to a baby as it travels down the birth canal (vagina). In a newborn, this can cause central nervous system damage, eye damage, or even death. The virus that causes genital herpes is usually HSV-2 virus. The virus that causes oral herpes is usually HSV-1. The diagnosis (learning what is wrong) is made through culture results. SYMPTOMS  Usually symptoms of pain and itching begin a few days to a week after contact. It first appears as small blisters that progress to small painful ulcers which then scab over and heal after several days. It affects the outer genitalia, birth canal, cervix, penis, anal area, buttocks, and thighs. HOME CARE INSTRUCTIONS   Keep ulcerated areas dry and clean.  Take medications as directed. Antiviral medications can speed up healing. They will not prevent recurrences or cure this infection. These medications can also be taken for suppression if there are frequent recurrences.  While the infection is active, it is contagious. Avoid all sexual contact during active infections.  Condoms may help prevent spread of the herpes virus.  Practice safe sex.  Wash your hands thoroughly after touching the genital area.  Avoid touching your eyes after touching your genital area.  Inform your caregiver if you have had genital herpes and become pregnant. It is your responsibility to insure a safe outcome for your baby in this pregnancy.  Only take over-the-counter or prescription medicines for pain, discomfort, or fever as directed by your caregiver. SEEK MEDICAL CARE IF:   You have a recurrence of this infection.  You do not respond to medications and are not improving.  You have new sources of pain or discharge which have changed from the original infection.  You have an oral temperature above 102 F (38.9 C).  You develop abdominal pain.  You  develop eye pain or signs of eye infection. Document Released: 03/04/2000 Document Revised: 05/30/2011 Document Reviewed: 03/25/2009 [facility] Patient Information 2014 [place], [place].

## 2013-02-01 NOTE — Progress Notes (Signed)
Subjective:     Patient ID: Sarena Jezek, female   DOB: 1965/08/18, 47 y.o.   MRN: [id number]  HPI 47 y.o. F here for discussion regarding herpes. Pt was seen by her PCM and has +HSV I and II AB titers. Pt reports 2 months ago she had a ?outbreak that has since resolved. Pt had numerous questions regarding infections and her partner.   Pt is having unprotected intercourse without contraception.  Review of Systems +bloating, spotting this cycle. Usually very regular and no spotting between other periods.     Objective:   Physical Exam Filed Vitals:   02/01/13 1112  BP: 115/70  Pulse: 68  Temp: 96.9 F (36.1 C)   VSS, NAD External labia, internal labia and speculum without evidence of herpes outbreak Pt with small hemmoroid.     Assessment:     47 y.o. F w/ +HSV titres without evidence of herpes outbreak. Extensive discussion regarding infection, transmission and recurrence. Discussed potential risks. Rx'd acyclovir PRN for out breaks. Pt stated understanding.  Tawana Scale, MD OB Fellow

## 2013-02-06 ENCOUNTER — Encounter: Payer: Self-pay | Admitting: *Deleted

## 2013-07-14 ENCOUNTER — Emergency Department (HOSPITAL_BASED_OUTPATIENT_CLINIC_OR_DEPARTMENT_OTHER): Payer: Medicaid Other

## 2013-07-14 ENCOUNTER — Encounter (HOSPITAL_BASED_OUTPATIENT_CLINIC_OR_DEPARTMENT_OTHER): Payer: Self-pay | Admitting: Emergency Medicine

## 2013-07-14 ENCOUNTER — Emergency Department (HOSPITAL_BASED_OUTPATIENT_CLINIC_OR_DEPARTMENT_OTHER)
Admission: EM | Admit: 2013-07-14 | Discharge: 2013-07-14 | Disposition: A | Discharge status: Home / Self Care | Payer: Medicaid Other | Attending: Emergency Medicine | Admitting: Emergency Medicine

## 2013-07-14 DIAGNOSIS — N949 Unspecified condition associated with female genital organs and menstrual cycle: Secondary | ICD-10-CM | POA: Insufficient documentation

## 2013-07-14 DIAGNOSIS — R42 Dizziness and giddiness: Secondary | ICD-10-CM | POA: Insufficient documentation

## 2013-07-14 DIAGNOSIS — Z8619 Personal history of other infectious and parasitic diseases: Secondary | ICD-10-CM | POA: Insufficient documentation

## 2013-07-14 DIAGNOSIS — Z8739 Personal history of other diseases of the musculoskeletal system and connective tissue: Secondary | ICD-10-CM | POA: Insufficient documentation

## 2013-07-14 DIAGNOSIS — N939 Abnormal uterine and vaginal bleeding, unspecified: Secondary | ICD-10-CM

## 2013-07-14 DIAGNOSIS — Z3202 Encounter for pregnancy test, result negative: Secondary | ICD-10-CM | POA: Insufficient documentation

## 2013-07-14 DIAGNOSIS — D649 Anemia, unspecified: Secondary | ICD-10-CM

## 2013-07-14 DIAGNOSIS — Z79899 Other long term (current) drug therapy: Secondary | ICD-10-CM | POA: Insufficient documentation

## 2013-07-14 DIAGNOSIS — N938 Other specified abnormal uterine and vaginal bleeding: Secondary | ICD-10-CM | POA: Insufficient documentation

## 2013-07-14 DIAGNOSIS — R110 Nausea: Secondary | ICD-10-CM | POA: Insufficient documentation

## 2013-07-14 DIAGNOSIS — Z880 Allergy status to penicillin: Secondary | ICD-10-CM | POA: Insufficient documentation

## 2013-07-14 DIAGNOSIS — D259 Leiomyoma of uterus, unspecified: Secondary | ICD-10-CM

## 2013-07-14 LAB — BASIC METABOLIC PANEL
BUN: 12 mg/dL (ref 6–23)
CHLORIDE: 105 meq/L (ref 96–112)
CO2: 26 meq/L (ref 19–32)
CREATININE: 0.7 mg/dL (ref 0.50–1.10)
Calcium: 9.6 mg/dL (ref 8.4–10.5)
GFR calc Af Amer: >90 mL/min (ref >90)
GFR calc non Af Amer: >90 mL/min (ref >90)
Glucose, Bld: 106 mg/dL — ABNORMAL HIGH (ref 70–99)
POTASSIUM: 3.7 meq/L (ref 3.7–5.3)
SODIUM: 142 meq/L (ref 137–147)

## 2013-07-14 LAB — CBC WITH DIFFERENTIAL/PLATELET
BASOS ABS: 0 10*3/uL (ref 0.0–0.1)
Basophils Relative: 0 % (ref 0–1)
Eosinophils Absolute: 0.1 10*3/uL (ref 0.0–0.7)
Eosinophils Relative: 1 % (ref 0–5)
HEMATOCRIT: 30.9 % — AB (ref 36.0–46.0)
HEMOGLOBIN: 9.9 g/dL — AB (ref 12.0–15.0)
LYMPHS PCT: 28 % (ref 12–46)
Lymphs Abs: 1.4 10*3/uL (ref 0.7–4.0)
MCH: 27 pg (ref 26.0–34.0)
MCHC: 32 g/dL (ref 30.0–36.0)
MCV: 84.4 fL (ref 78.0–100.0)
MONO ABS: 0.4 10*3/uL (ref 0.1–1.0)
Monocytes Relative: 9 % (ref 3–12)
NEUTROS ABS: 3.1 10*3/uL (ref 1.7–7.7)
Neutrophils Relative %: 62 % (ref 43–77)
Platelets: 243 10*3/uL (ref 150–400)
RBC: 3.66 MIL/uL — ABNORMAL LOW (ref 3.87–5.11)
RDW: 15.2 % (ref 11.5–15.5)
WBC: 4.9 10*3/uL (ref 4.0–10.5)

## 2013-07-14 LAB — PREGNANCY, URINE: PREG TEST UR: NEGATIVE

## 2013-07-14 LAB — WET PREP, GENITAL
TRICH WET PREP: NONE SEEN
Yeast Wet Prep HPF POC: NONE SEEN

## 2013-07-14 MED ORDER — MEDROXYPROGESTERONE ACETATE 10 MG PO TABS: 10.0000 mg | ORAL_TABLET | Freq: Every day | ORAL | Status: DC

## 2013-07-14 MED ORDER — SODIUM CHLORIDE 0.9 % IV BOLUS (SEPSIS)
1000.0000 mL | Freq: Once | INTRAVENOUS | Status: AC
  Administered 2013-07-14: 1000 mL via INTRAVENOUS

## 2013-07-14 NOTE — ED Notes (Addendum)
Patient here with vaginal bleeding for the past seven days, reports that she just finished her regular menstrual cycle 8 days ago and has been bleeding moderately heavy for past 14 days, no clots, feels weak. Reports abdominal cramping earlier in the week and now describes her discomfort as abdominal pressure.

## 2013-07-14 NOTE — ED Provider Notes (Addendum)
CSN: [id number]     Arrival date & time 07/14/13  1157 History   First MD Initiated Contact with Patient 07/14/13 1218     Chief Complaint  Patient presents with  . Vaginal Bleeding     (Consider location/radiation/quality/duration/timing/severity/associated sxs/prior Treatment) Patient is a 48 y.o. female presenting with vaginal bleeding. The history is provided by the patient. No language interpreter was used.  Vaginal Bleeding Severity:  Moderate Associated symptoms: nausea   Associated symptoms: no abdominal pain, no back pain, no dysuria and no fever   Associated symptoms comment:  She reports normal menses on April 10 that lasted her usual 7 days, ending on 4/18. She started having recurrent bleeding on 4/20 and has been bleeding heavier that usual menses since that date. She started having symptoms of lightheadedness yesterday without syncope or near syncope. She reports a history of fibroids, last evaluated in 2007. No vaginal discharge noted prior to bleeding. She does report a history of anemia in the past, with her only transfusion post-surgery years ago. Vaginal Bleeding The patient's primary symptoms include pelvic pain. Associated symptoms include nausea. Pertinent negatives include no abdominal pain, back pain, dysuria, fever or vomiting. Associated symptoms comments: She reports normal menses on April 10 that lasted her usual 7 days, ending on 4/18. She started having recurrent bleeding on 4/20 and has been bleeding heavier that usual menses since that date. She started having symptoms of lightheadedness yesterday without syncope or near syncope. She reports a history of fibroids, last evaluated in 2007. No vaginal discharge noted prior to bleeding. She does report a history of anemia in the past, with her only transfusion post-surgery years ago..    Past Medical History  Diagnosis Date  . Scoliosis   . Miscarriage   . Factor V Leiden mutation complicating pregnancy   .  Anemia    (history item removed per amendment request approved by Dr. Kennon Rounds on 06/11/2014/ Dorice Lamas, RN BSN    Past Surgical History  Procedure Laterality Date  . Harrington rod  1980   Family History  Problem Relation Age of Onset  . Breast cancer Mother     deceased  . Heart disease Father     deceased  . Lung cancer Brother     deceased   History  Substance Use Topics  . Smoking status: Never Smoker   . Smokeless tobacco: Not on file  . Alcohol Use: Yes     Comment: socially   OB History   Grav Para Term Preterm Abortions TAB SAB Ect Mult Living                 Review of Systems  Constitutional: Negative for fever.  Respiratory: Negative for shortness of breath.   Cardiovascular: Negative for chest pain.  Gastrointestinal: Positive for nausea. Negative for vomiting and abdominal pain.  Genitourinary: Positive for vaginal bleeding and pelvic pain. Negative for dysuria.  Musculoskeletal: Negative for back pain.  Neurological: Positive for light-headedness.  Psychiatric/Behavioral: Negative for confusion.      Allergies  Penicillins  Home Medications   Prior to Admission medications   Medication Sig Start Date End Date Taking? Authorizing Provider  acyclovir (ZOVIRAX) 400 MG tablet Take 1 tablet (400 mg total) by mouth 3 (three) times daily. 02/01/13   Allen Norris, MD  econazole nitrate 1 % cream Apply topically daily. 08/28/12   Debbrah Alar, NP  ferrous sulfate 325 (65 FE) MG tablet Take 325 mg by mouth daily with breakfast.  Historical Provider, MD  ibuprofen (ADVIL,MOTRIN) 800 MG tablet Take 800 mg by mouth every 8 (eight) hours as needed.    Historical Provider, MD  LYSINE PO Take 1 tablet by mouth daily.    Historical Provider, MD  Multiple Vitamin (MULTIVITAMIN) capsule Take 1 capsule by mouth daily.    Historical Provider, MD  Pyridoxine HCl (VITAMIN B-6 PO) Take 1 tablet by mouth daily.    Historical Provider, MD   BP 165/71  Pulse  90  Temp(Src) 98.8 F (37.1 C) (Oral)  Resp 18  SpO2 98%  LMP 06/30/2013 Physical Exam  Constitutional: She is oriented to person, place, and time. She appears well-developed and well-nourished.  HENT:  Head: Normocephalic.  Eyes:  Mild conjunctival pallor.  Neck: Normal range of motion. Neck supple.  Cardiovascular: Normal rate and regular rhythm.   Pulmonary/Chest: Effort normal and breath sounds normal.  Abdominal: Soft. Bowel sounds are normal. There is no tenderness. There is no rebound and no guarding.  Genitourinary:  Significant vaginal bleeding without clots. No discharge visualized. There are palpable, firm lesions above cervix c/w ?fibroid.   Musculoskeletal: Normal range of motion.  Neurological: She is alert and oriented to person, place, and time.  Skin: Skin is warm and dry. No rash noted.  Psychiatric: She has a normal mood and affect.    ED Course  Procedures (including critical care time) Labs Review Labs Reviewed  PREGNANCY, URINE  CBC WITH DIFFERENTIAL  BASIC METABOLIC PANEL   Results for orders placed during the hospital encounter of 07/14/13  WET PREP, GENITAL      Result Value Ref Range   Yeast Wet Prep HPF POC NONE SEEN  NONE SEEN   Trich, Wet Prep NONE SEEN  NONE SEEN   Clue Cells Wet Prep HPF POC MANY (*) NONE SEEN   WBC, Wet Prep HPF POC FEW (*) NONE SEEN  PREGNANCY, URINE      Result Value Ref Range   Preg Test, Ur NEGATIVE  NEGATIVE  CBC WITH DIFFERENTIAL      Result Value Ref Range   WBC 4.9  4.0 - 10.5 K/uL   RBC 3.66 (*) 3.87 - 5.11 MIL/uL   Hemoglobin 9.9 (*) 12.0 - 15.0 g/dL   HCT 30.9 (*) 36.0 - 46.0 %   MCV 84.4  78.0 - 100.0 fL   MCH 27.0  26.0 - 34.0 pg   MCHC 32.0  30.0 - 36.0 g/dL   RDW 15.2  11.5 - 15.5 %   Platelets 243  150 - 400 K/uL   Neutrophils Relative % 62  43 - 77 %   Neutro Abs 3.1  1.7 - 7.7 K/uL   Lymphocytes Relative 28  12 - 46 %   Lymphs Abs 1.4  0.7 - 4.0 K/uL   Monocytes Relative 9  3 - 12 %    Monocytes Absolute 0.4  0.1 - 1.0 K/uL   Eosinophils Relative 1  0 - 5 %   Eosinophils Absolute 0.1  0.0 - 0.7 K/uL   Basophils Relative 0  0 - 1 %   Basophils Absolute 0.0  0.0 - 0.1 K/uL  BASIC METABOLIC PANEL      Result Value Ref Range   Sodium 142  137 - 147 mEq/L   Potassium 3.7  3.7 - 5.3 mEq/L   Chloride 105  96 - 112 mEq/L   CO2 26  19 - 32 mEq/L   Glucose, Bld 106 (*) 70 - 99 mg/dL  BUN 12  6 - 23 mg/dL   Creatinine, Ser 0.70  0.50 - 1.10 mg/dL   Calcium 9.6  8.4 - 10.5 mg/dL   GFR calc non Af Amer >90  >90 mL/min   GFR calc Af Amer >90  >90 mL/min   US Transvaginal Non-ob  07/14/2013   CLINICAL DATA:  Vaginal bleeding  EXAM: TRANSVAGINAL ULTRASOUND OF PELVIS  TECHNIQUE: Transvaginal ultrasound examination of the pelvis was performed including evaluation of the uterus, ovaries, adnexal regions, and pelvic cul-de-sac.  COMPARISON:  None.  FINDINGS: Uterus  Measurements: 10.4 x 6.0 x 6.5 cm. Uterus is mildly heterogeneous in echotexture. There are several rounded heterogeneous lesions consistent with intramural leiomyomas. The largest is in the posterior wall measures 2.4 cm and mildly indents the endometrium but is not felt to a submucosal lesion. No fibroids or other mass visualized.  Endometrium  Thickness: Normal in thickness for premenopausal female at 7 mm. No focal abnormality visualized.  Right ovary  Measurements: Not identified. Normal appearance/no adnexal mass.  No  Left ovary  Measurements: 3.6 x 1.3 x 1.4 cm. Normal ovary  Other findings:  No free fluid  IMPRESSION: 1. Intramural leiomyomas within the uterus. 2. Normal endometrium for age (premenopausal) 3. Right ovary not identified.   Electronically Signed   By: Suzy Bouchard M.D.   On: 07/14/2013 14:12   US Pelvis Complete  07/14/2013   CLINICAL DATA:  Vaginal bleeding  EXAM: TRANSVAGINAL ULTRASOUND OF PELVIS  TECHNIQUE: Transvaginal ultrasound examination of the pelvis was performed including evaluation of the  uterus, ovaries, adnexal regions, and pelvic cul-de-sac.  COMPARISON:  None.  FINDINGS: Uterus  Measurements: 10.4 x 6.0 x 6.5 cm. Uterus is mildly heterogeneous in echotexture. There are several rounded heterogeneous lesions consistent with intramural leiomyomas. The largest is in the posterior wall measures 2.4 cm and mildly indents the endometrium but is not felt to a submucosal lesion. No fibroids or other mass visualized.  Endometrium  Thickness: Normal in thickness for premenopausal female at 7 mm. No focal abnormality visualized.  Right ovary  Measurements: Not identified. Normal appearance/no adnexal mass.  No  Left ovary  Measurements: 3.6 x 1.3 x 1.4 cm. Normal ovary  Other findings:  No free fluid  IMPRESSION: 1. Intramural leiomyomas within the uterus. 2. Normal endometrium for age (premenopausal) 3. Right ovary not identified.   Electronically Signed   By: Suzy Bouchard M.D.   On: 07/14/2013 14:12   Imaging Review No results found.   EKG Interpretation None      MDM   Final diagnoses:  None    1. Abnormal vaginal bleeding 2. Fibroids 3. Anemia  She is stable throughout ED visit. VSS. Mild anemia not requiring transfusion. [place] c/w fibroids with known history, no comparable study. She has an appointment with GYN this week for follow up. Will give Premarin x 3 days for bleeding.     Dewaine Oats, PA-C 07/14/13 1540

## 2013-07-14 NOTE — Discharge Instructions (Signed)
Abnormal Uterine Bleeding Abnormal uterine bleeding means bleeding from the vagina that is not your normal menstrual period. This can be:  Bleeding or spotting between periods.  Bleeding after sex (sexual intercourse).  Bleeding that is heavier or more than normal.  Periods that last longer than usual.  Bleeding after menopause. There are many problems that may cause this. Treatment will depend on the cause of the bleeding. Any kind of bleeding that is not normal should be reviewed by your doctor.  HOME CARE Watch your condition for any changes. These actions may lessen any discomfort you are having:  Do not use tampons or douches as told by your doctor.  Change your pads often. You should get regular pelvic exams and Pap tests. Keep all appointments for tests as told by your doctor. GET HELP IF:  You are bleeding for more than 1 week.  You feel dizzy at times. GET HELP RIGHT AWAY IF:   You pass out.  You have to change pads every 15 to 30 minutes.  You have belly pain.  You have a fever.  You become sweaty or weak.  You are passing large blood clots from the vagina.  You feel sick to your stomach (nauseous) and throw up (vomit). MAKE SURE YOU:  Understand these instructions.  Will watch your condition.  Will get help right away if you are not doing well or get worse. Document Released: 01/02/2009 Document Revised: 12/26/2012 Document Reviewed: 10/04/2012 [facility] Patient Information 2014 [place], [place].  Fibroids Fibroids are lumps (tumors) that can occur any place in a woman's body. These lumps are not cancerous. Fibroids vary in size, weight, and where they grow. HOME CARE  Do not take aspirin.  Write down the number of pads or tampons you use during your period. Tell your doctor. This can help determine the best treatment for you. GET HELP RIGHT AWAY IF:  You have pain in your lower belly (abdomen) that is not helped with medicine.  You have cramps  that are not helped with medicine.  You have more bleeding between or during your period.  You feel lightheaded or pass out (faint).  Your lower belly pain gets worse. MAKE SURE YOU:  Understand these instructions.  Will watch your condition.  Will get help right away if you are not doing well or get worse. Document Released: 04/09/2010 Document Revised: 05/30/2011 Document Reviewed: 04/09/2010 [facility] Patient Information 2014 [place], [place].  Anemia, Nonspecific Anemia is a condition in which the concentration of red blood cells or hemoglobin in the blood is below normal. Hemoglobin is a substance in red blood cells that carries oxygen to the tissues of the body. Anemia results in not enough oxygen reaching these tissues.  CAUSES  Common causes of anemia include:   Excessive bleeding. Bleeding may be internal or external. This includes excessive bleeding from periods (in women) or from the intestine.   Poor nutrition.   Chronic kidney, thyroid, and liver disease.  Bone marrow disorders that decrease red blood cell production.  Cancer and treatments for cancer.  HIV, AIDS, and their treatments.  Spleen problems that increase red blood cell destruction.  Blood disorders.  Excess destruction of red blood cells due to infection, medicines, and autoimmune disorders. SIGNS AND SYMPTOMS   Minor weakness.   Dizziness.   Headache.  Palpitations.   Shortness of breath, especially with exercise.   Paleness.  Cold sensitivity.  Indigestion.  Nausea.  Difficulty sleeping.  Difficulty concentrating. Symptoms may occur suddenly or  they may develop slowly.  DIAGNOSIS  Additional blood tests are often needed. These help your health care provider determine the best treatment. Your health care provider will check your stool for blood and look for other causes of blood loss.  TREATMENT  Treatment varies depending on the cause of the anemia. Treatment can  include:   Supplements of iron, vitamin Z61, or folic acid.   Hormone medicines.   A blood transfusion. This may be needed if blood loss is severe.   Hospitalization. This may be needed if there is significant continual blood loss.   Dietary changes.  Spleen removal. HOME CARE INSTRUCTIONS Keep all follow-up appointments. It often takes many weeks to correct anemia, and having your health care provider check on your condition and your response to treatment is very important. SEEK IMMEDIATE MEDICAL CARE IF:   You develop extreme weakness, shortness of breath, or chest pain.   You become dizzy or have trouble concentrating.  You develop heavy vaginal bleeding.   You develop a rash.   You have bloody or black, tarry stools.   You faint.   You vomit up blood.   You vomit repeatedly.   You have abdominal pain.  You have a fever or persistent symptoms for more than 2 3 days.   You have a fever and your symptoms suddenly get worse.   You are dehydrated.  MAKE SURE YOU:  Understand these instructions.  Will watch your condition.  Will get help right away if you are not doing well or get worse. Document Released: 04/14/2004 Document Revised: 11/07/2012 Document Reviewed: 08/31/2012 [facility] Patient Information 2014 [place].  Results for orders placed during the hospital encounter of 07/14/13  WET PREP, GENITAL      Result Value Ref Range   Yeast Wet Prep HPF POC NONE SEEN  NONE SEEN   Trich, Wet Prep NONE SEEN  NONE SEEN   Clue Cells Wet Prep HPF POC MANY (*) NONE SEEN   WBC, Wet Prep HPF POC FEW (*) NONE SEEN  PREGNANCY, URINE      Result Value Ref Range   Preg Test, Ur NEGATIVE  NEGATIVE  CBC WITH DIFFERENTIAL      Result Value Ref Range   WBC 4.9  4.0 - 10.5 K/uL   RBC 3.66 (*) 3.87 - 5.11 MIL/uL   Hemoglobin 9.9 (*) 12.0 - 15.0 g/dL   HCT 30.9 (*) 36.0 - 46.0 %   MCV 84.4  78.0 - 100.0 fL   MCH 27.0  26.0 - 34.0 pg   MCHC 32.0  30.0  - 36.0 g/dL   RDW 15.2  11.5 - 15.5 %   Platelets 243  150 - 400 K/uL   Neutrophils Relative % 62  43 - 77 %   Neutro Abs 3.1  1.7 - 7.7 K/uL   Lymphocytes Relative 28  12 - 46 %   Lymphs Abs 1.4  0.7 - 4.0 K/uL   Monocytes Relative 9  3 - 12 %   Monocytes Absolute 0.4  0.1 - 1.0 K/uL   Eosinophils Relative 1  0 - 5 %   Eosinophils Absolute 0.1  0.0 - 0.7 K/uL   Basophils Relative 0  0 - 1 %   Basophils Absolute 0.0  0.0 - 0.1 K/uL  BASIC METABOLIC PANEL      Result Value Ref Range   Sodium 142  137 - 147 mEq/L   Potassium 3.7  3.7 - 5.3 mEq/L   Chloride  105  96 - 112 mEq/L   CO2 26  19 - 32 mEq/L   Glucose, Bld 106 (*) 70 - 99 mg/dL   BUN 12  6 - 23 mg/dL   Creatinine, Ser 0.70  0.50 - 1.10 mg/dL   Calcium 9.6  8.4 - 10.5 mg/dL   GFR calc non Af Amer >90  >90 mL/min   GFR calc Af Amer >90  >90 mL/min

## 2013-07-15 LAB — GC/CHLAMYDIA PROBE AMP
CT PROBE, AMP APTIMA: NEGATIVE
GC PROBE AMP APTIMA: NEGATIVE

## 2013-07-18 NOTE — ED Provider Notes (Signed)
Medical screening examination/treatment/procedure(s) were performed by non-physician practitioner and as supervising physician I was immediately available for consultation/collaboration.   EKG Interpretation None       Tanna Furry, MD 07/18/13 [phone]

## 2013-07-18 NOTE — ED Notes (Signed)
Patient called to state that she wants to know what to expect from the hormone medication she was given while here.  States she continues to have bleeding and is concerned she will drop her Hbg further.  Discussed case with K. Sophia, [facility], will encourage pt to go to the MAU department at [facility] for evaluation and further treatment.  Pt verbalized understanding.

## 2013-10-21 ENCOUNTER — Ambulatory Visit: Payer: Medicaid Other | Attending: Orthopedic Surgery | Admitting: Rehabilitation

## 2013-10-21 DIAGNOSIS — IMO0001 Reserved for inherently not codable concepts without codable children: Secondary | ICD-10-CM | POA: Diagnosis present

## 2013-10-21 DIAGNOSIS — R293 Abnormal posture: Secondary | ICD-10-CM | POA: Diagnosis not present

## 2013-11-11 ENCOUNTER — Ambulatory Visit: Payer: Medicaid Other | Admitting: Rehabilitation

## 2014-03-12 DIAGNOSIS — G43829 Menstrual migraine, not intractable, without status migrainosus: Secondary | ICD-10-CM | POA: Insufficient documentation

## 2014-03-12 HISTORY — DX: Menstrual migraine, not intractable, without status migrainosus: G43.829

## 2014-04-21 ENCOUNTER — Other Ambulatory Visit (HOSPITAL_COMMUNITY): Payer: Self-pay | Admitting: Obstetrics and Gynecology

## 2014-04-21 DIAGNOSIS — Z1231 Encounter for screening mammogram for malignant neoplasm of breast: Secondary | ICD-10-CM

## 2014-04-30 ENCOUNTER — Encounter: Payer: Self-pay | Admitting: *Deleted

## 2014-04-30 NOTE — Progress Notes (Signed)
Patient spoke with [employer] to request amendment to her medical record.  I have spoken with patient today and confirmed her mailing address as [address], [place], [place] [address].  Explained to patient I would be sending her the form for requesting an amendment to her medical record via mail today.  Explained she would need to complete the form and return to [place] and then we would proceed with handling the issue.  Patient stated understanding.

## 2014-05-15 ENCOUNTER — Other Ambulatory Visit (HOSPITAL_COMMUNITY): Payer: Self-pay | Admitting: Internal Medicine

## 2014-05-15 DIAGNOSIS — Z1231 Encounter for screening mammogram for malignant neoplasm of breast: Secondary | ICD-10-CM

## 2014-05-20 ENCOUNTER — Ambulatory Visit (HOSPITAL_COMMUNITY)
Admission: RE | Admit: 2014-05-20 | Discharge: 2014-05-20 | Disposition: A | Discharge status: Home / Self Care | Payer: Medicaid Other | Source: Ambulatory Visit | Attending: Internal Medicine | Admitting: Internal Medicine

## 2014-05-20 DIAGNOSIS — Z1231 Encounter for screening mammogram for malignant neoplasm of breast: Secondary | ICD-10-CM | POA: Insufficient documentation

## 2014-06-11 ENCOUNTER — Encounter: Payer: Self-pay | Admitting: *Deleted

## 2014-09-09 ENCOUNTER — Telehealth: Payer: Self-pay | Admitting: *Deleted

## 2014-09-09 NOTE — Telephone Encounter (Signed)
Phoned patient to let her know that she can opt our of Care Everywhere.  Explained to patient I would need something in writing to make this happen.  Patient requested my email address which was given.  I explained that once I receive her request in writing we will take care of it for her.  Patient states understanding.

## 2014-10-07 DIAGNOSIS — M51369 Other intervertebral disc degeneration, lumbar region without mention of lumbar back pain or lower extremity pain: Secondary | ICD-10-CM | POA: Insufficient documentation

## 2014-10-07 DIAGNOSIS — M5136 Other intervertebral disc degeneration, lumbar region: Secondary | ICD-10-CM

## 2014-10-07 HISTORY — DX: Other intervertebral disc degeneration, lumbar region without mention of lumbar back pain or lower extremity pain: M51.369

## 2014-10-07 HISTORY — DX: Other intervertebral disc degeneration, lumbar region: M51.36

## 2015-05-11 ENCOUNTER — Other Ambulatory Visit: Payer: Self-pay

## 2015-05-11 DIAGNOSIS — Z1231 Encounter for screening mammogram for malignant neoplasm of breast: Secondary | ICD-10-CM

## 2015-05-27 ENCOUNTER — Ambulatory Visit
Admission: RE | Admit: 2015-05-27 | Discharge: 2015-05-27 | Disposition: A | Discharge status: Home / Self Care | Payer: Medicaid Other | Source: Ambulatory Visit

## 2015-05-27 DIAGNOSIS — Z1231 Encounter for screening mammogram for malignant neoplasm of breast: Secondary | ICD-10-CM

## 2015-05-29 ENCOUNTER — Other Ambulatory Visit: Payer: Self-pay | Admitting: Internal Medicine

## 2015-05-29 DIAGNOSIS — R928 Other abnormal and inconclusive findings on diagnostic imaging of breast: Secondary | ICD-10-CM

## 2015-06-09 ENCOUNTER — Ambulatory Visit
Admission: RE | Admit: 2015-06-09 | Discharge: 2015-06-09 | Disposition: A | Discharge status: Home / Self Care | Payer: Medicaid Other | Source: Ambulatory Visit | Attending: Internal Medicine | Admitting: Internal Medicine

## 2015-06-09 DIAGNOSIS — R928 Other abnormal and inconclusive findings on diagnostic imaging of breast: Secondary | ICD-10-CM

## 2015-09-21 ENCOUNTER — Emergency Department (HOSPITAL_BASED_OUTPATIENT_CLINIC_OR_DEPARTMENT_OTHER)
Admission: EM | Admit: 2015-09-21 | Discharge: 2015-09-21 | Disposition: A | Discharge status: Home / Self Care | Payer: Medicaid Other | Attending: Dermatology | Admitting: Dermatology

## 2015-09-21 ENCOUNTER — Encounter (HOSPITAL_BASED_OUTPATIENT_CLINIC_OR_DEPARTMENT_OTHER): Payer: Self-pay | Admitting: *Deleted

## 2015-09-21 DIAGNOSIS — Z79899 Other long term (current) drug therapy: Secondary | ICD-10-CM | POA: Insufficient documentation

## 2015-09-21 DIAGNOSIS — R42 Dizziness and giddiness: Secondary | ICD-10-CM | POA: Diagnosis present

## 2015-09-21 DIAGNOSIS — Z791 Long term (current) use of non-steroidal anti-inflammatories (NSAID): Secondary | ICD-10-CM | POA: Insufficient documentation

## 2015-09-21 NOTE — ED Notes (Signed)
Dizziness x 3 days. Hx of same 4 years ago after a ear infection.

## 2015-09-29 DIAGNOSIS — H8110 Benign paroxysmal vertigo, unspecified ear: Secondary | ICD-10-CM

## 2015-09-29 DIAGNOSIS — H6123 Impacted cerumen, bilateral: Secondary | ICD-10-CM

## 2015-09-29 DIAGNOSIS — H903 Sensorineural hearing loss, bilateral: Secondary | ICD-10-CM

## 2015-09-29 HISTORY — DX: Impacted cerumen, bilateral: H61.23

## 2015-09-29 HISTORY — DX: Benign paroxysmal vertigo, unspecified ear: H81.10

## 2015-09-29 HISTORY — DX: Sensorineural hearing loss, bilateral: H90.3

## 2016-05-09 ENCOUNTER — Other Ambulatory Visit: Payer: Self-pay | Admitting: Internal Medicine

## 2016-05-09 DIAGNOSIS — Z1231 Encounter for screening mammogram for malignant neoplasm of breast: Secondary | ICD-10-CM

## 2016-05-27 ENCOUNTER — Ambulatory Visit
Admission: RE | Admit: 2016-05-27 | Discharge: 2016-05-27 | Disposition: A | Discharge status: Home / Self Care | Payer: Medicaid Other | Source: Ambulatory Visit | Attending: Internal Medicine | Admitting: Internal Medicine

## 2016-05-27 DIAGNOSIS — Z1231 Encounter for screening mammogram for malignant neoplasm of breast: Secondary | ICD-10-CM

## 2017-01-03 DIAGNOSIS — M26621 Arthralgia of right temporomandibular joint: Secondary | ICD-10-CM

## 2017-01-03 DIAGNOSIS — J302 Other seasonal allergic rhinitis: Secondary | ICD-10-CM

## 2017-01-03 HISTORY — DX: Other seasonal allergic rhinitis: J30.2

## 2017-01-03 HISTORY — DX: Arthralgia of right temporomandibular joint: M26.621

## 2017-03-05 ENCOUNTER — Encounter (HOSPITAL_BASED_OUTPATIENT_CLINIC_OR_DEPARTMENT_OTHER): Payer: Self-pay | Admitting: *Deleted

## 2017-03-05 ENCOUNTER — Emergency Department (HOSPITAL_BASED_OUTPATIENT_CLINIC_OR_DEPARTMENT_OTHER)
Admission: EM | Admit: 2017-03-05 | Discharge: 2017-03-05 | Disposition: A | Discharge status: Home / Self Care | Payer: BLUE CROSS/BLUE SHIELD | Attending: Emergency Medicine | Admitting: Emergency Medicine

## 2017-03-05 ENCOUNTER — Emergency Department (HOSPITAL_BASED_OUTPATIENT_CLINIC_OR_DEPARTMENT_OTHER): Payer: BLUE CROSS/BLUE SHIELD

## 2017-03-05 ENCOUNTER — Other Ambulatory Visit: Payer: Self-pay

## 2017-03-05 DIAGNOSIS — Z79899 Other long term (current) drug therapy: Secondary | ICD-10-CM | POA: Insufficient documentation

## 2017-03-05 DIAGNOSIS — R1013 Epigastric pain: Secondary | ICD-10-CM | POA: Insufficient documentation

## 2017-03-05 DIAGNOSIS — R1012 Left upper quadrant pain: Secondary | ICD-10-CM | POA: Diagnosis present

## 2017-03-05 HISTORY — DX: Bell's palsy: G51.0

## 2017-03-05 HISTORY — DX: Unspecified pre-eclampsia, unspecified trimester: O14.90

## 2017-03-05 LAB — URINALYSIS, ROUTINE W REFLEX MICROSCOPIC
BILIRUBIN URINE: NEGATIVE
Glucose, UA: NEGATIVE mg/dL
KETONES UR: NEGATIVE mg/dL
Leukocytes, UA: NEGATIVE
Nitrite: NEGATIVE
PROTEIN: NEGATIVE mg/dL
Specific Gravity, Urine: 1.015 (ref 1.005–1.030)
pH: 6 (ref 5.0–8.0)

## 2017-03-05 LAB — COMPREHENSIVE METABOLIC PANEL
ALT: 13 U/L — ABNORMAL LOW (ref 14–54)
ANION GAP: 6 (ref 5–15)
AST: 17 U/L (ref 15–41)
Albumin: 4.2 g/dL (ref 3.5–5.0)
Alkaline Phosphatase: 82 U/L (ref 38–126)
BILIRUBIN TOTAL: 0.6 mg/dL (ref 0.3–1.2)
BUN: 18 mg/dL (ref 6–20)
CO2: 26 mmol/L (ref 22–32)
Calcium: 9.2 mg/dL (ref 8.9–10.3)
Chloride: 105 mmol/L (ref 101–111)
Creatinine, Ser: 0.68 mg/dL (ref 0.44–1.00)
GFR calc non Af Amer: >60 mL/min (ref >60)
Glucose, Bld: 113 mg/dL — ABNORMAL HIGH (ref 65–99)
Potassium: 3.9 mmol/L (ref 3.5–5.1)
SODIUM: 137 mmol/L (ref 135–145)
Total Protein: 7.2 g/dL (ref 6.5–8.1)

## 2017-03-05 LAB — URINALYSIS, MICROSCOPIC (REFLEX)

## 2017-03-05 LAB — CBC WITH DIFFERENTIAL/PLATELET
Basophils Absolute: 0 10*3/uL (ref 0.0–0.1)
Basophils Relative: 0 %
EOS ABS: 0.1 10*3/uL (ref 0.0–0.7)
Eosinophils Relative: 1 %
HEMATOCRIT: 36.4 % (ref 36.0–46.0)
Hemoglobin: 12.3 g/dL (ref 12.0–15.0)
LYMPHS ABS: 1.6 10*3/uL (ref 0.7–4.0)
Lymphocytes Relative: 42 %
MCH: 29.6 pg (ref 26.0–34.0)
MCHC: 33.8 g/dL (ref 30.0–36.0)
MCV: 87.5 fL (ref 78.0–100.0)
MONOS PCT: 8 %
Monocytes Absolute: 0.3 10*3/uL (ref 0.1–1.0)
Neutro Abs: 1.9 10*3/uL (ref 1.7–7.7)
Neutrophils Relative %: 49 %
Platelets: 244 10*3/uL (ref 150–400)
RBC: 4.16 MIL/uL (ref 3.87–5.11)
RDW: 12.7 % (ref 11.5–15.5)
WBC: 3.8 10*3/uL — ABNORMAL LOW (ref 4.0–10.5)

## 2017-03-05 LAB — LIPASE, BLOOD: LIPASE: 29 U/L (ref 11–51)

## 2017-03-05 MED ORDER — PANTOPRAZOLE SODIUM 40 MG PO TBEC: 40.0000 mg | DELAYED_RELEASE_TABLET | Freq: Every day | ORAL | 1 refills | Status: DC

## 2017-03-05 MED ORDER — IOPAMIDOL (ISOVUE-300) INJECTION 61%
100.0000 mL | Freq: Once | INTRAVENOUS | Status: AC | PRN
  Administered 2017-03-05: 100 mL via INTRAVENOUS

## 2017-03-05 NOTE — ED Notes (Signed)
ED Provider at bedside. 

## 2017-03-05 NOTE — ED Triage Notes (Signed)
Pt reports intermittent abd pain for approx 1 yr (states she's premenopausal and has been evaluated OB/GYN). Reports pain is worse when lying down. Denies fever, n/v. Reports increase in flatulence and belching. Reports intermittent diarrhea x3 months.

## 2017-03-05 NOTE — ED Provider Notes (Signed)
[facility] EMERGENCY DEPARTMENT Provider Note   CSN: [id number] Arrival date & time: 03/05/17  0730     History   Chief Complaint Chief Complaint  Patient presents with  . Abdominal Pain    HPI Jenny Wright is a 51 y.o. female.  Patient is a 51 year old female who is currently premenopausal having periods only every 6 months, anemia, scoliosis status post rod in her back presenting today with gradually worsening abdominal discomfort over the last 6-8 months.  Patient states recently it has become worse.  Initially when things began she felt that it was just related to being premenopausal however now she is not sure.  She describes it as feeling like there is a ball in her upper abdomen seems to be worse at night and now she is even having difficulty sleeping.  Last night was the first night she felt like she might vomit.  She has a very full feeling after a large meal with distention.  She is intermittently getting diarrhea but cannot quantify.  In the last month also she will get unusual sensations up the middle of her chest into her throat associated with the fullness in her stomach.  She has not tried taking any medications she is not tried to alter her diet.  She denies any weight loss.  No fever, cough or shortness of breath.   The history is provided by the patient.  Abdominal Pain   This is a recurrent problem. Episode onset: Has been happening gradually over months but worse in the last month. The problem occurs constantly. The problem has been gradually worsening. The pain is associated with an unknown factor. The pain is located in the epigastric region and LUQ. The quality of the pain is dull and pressure-like. The pain is at a severity of 2/10. The pain is moderate. Associated symptoms include belching, diarrhea, flatus and nausea. Pertinent negatives include anorexia, fever, vomiting, constipation, dysuria and frequency. Exacerbated by: Seems to be worse at night  when laying down. Nothing relieves the symptoms. Past workup comments: None. Past medical history comments: None.  Has not tried taking anything for this either.    Past Medical History:  Diagnosis Date  . Anemia   . Bell's palsy   . Factor V Leiden mutation complicating pregnancy ([place])   . Miscarriage   . Preeclampsia    pregnancy  . Scoliosis     Patient Active Problem List   Diagnosis Date Noted  . Tinea pedis 08/29/2012  . Onychomycosis 08/29/2012  . Rash and nonspecific skin eruption 08/29/2012  . Bell's palsy 02/13/2012  . Anemia 02/13/2012  . Uterine fibroid 02/13/2012  . Scoliosis 02/13/2012  . Antiphospholipid antibody positive 02/13/2012    Past Surgical History:  Procedure Laterality Date  . BACK SURGERY    . harrington rod  1980    OB History    No data available       Home Medications    Prior to Admission medications   Medication Sig Start Date End Date Taking? Authorizing Provider  Cholecalciferol (VITAMIN D3) 1000 units CAPS Take by mouth.   Yes [provider]  ferrous gluconate (FERGON) 324 MG tablet Take 324 mg by mouth daily with breakfast.   Yes [provider]  Multiple Vitamin (MULTIVITAMIN) capsule Take 1 capsule by mouth daily.   Yes [provider]  acyclovir (ZOVIRAX) 400 MG tablet Take 1 tablet (400 mg total) by mouth 3 (three) times daily. 02/01/13   Leamon Arnt  R, MD  econazole nitrate 1 % cream Apply topically daily. 08/28/12   Debbrah Alar, NP  ferrous sulfate 325 (65 FE) MG tablet Take 325 mg by mouth daily with breakfast.    [provider]  ibuprofen (ADVIL,MOTRIN) 800 MG tablet Take 800 mg by mouth every 8 (eight) hours as needed.    [provider]  LYSINE PO Take 1 tablet by mouth daily.    [provider]  medroxyPROGESTERone (PROVERA) 10 MG tablet Take 1 tablet (10 mg total) by mouth daily. 07/14/13   Charlann Lange, PA-C  Pyridoxine HCl (VITAMIN B-6 PO) Take 1  tablet by mouth daily.    [provider]    Family History Family History  Problem Relation Age of Onset  . Breast cancer Mother        deceased  . Heart disease Father        deceased  . Lung cancer Brother        deceased    Social History Social History   Tobacco Use  . Smoking status: Never Smoker  . Smokeless tobacco: Never Used  Substance Use Topics  . Alcohol use: No    Frequency: Never  . Drug use: No     Allergies   Penicillins   Review of Systems Review of Systems  Constitutional: Negative for fever.  Gastrointestinal: Positive for abdominal pain, diarrhea, flatus and nausea. Negative for anorexia, constipation and vomiting.  Genitourinary: Negative for dysuria and frequency.  All other systems reviewed and are negative.    Physical Exam Updated Vital Signs BP (!) 149/88 (BP Location: Right Arm)   Pulse 72   Temp 98.2 F (36.8 C) (Oral)   Resp 16   Ht 5\' 7"  (1.702 m)   Wt 81.6 kg (180 lb)   LMP 02/26/2017 (Approximate)   SpO2 100%   BMI 28.19 kg/m   Physical Exam  Constitutional: She is oriented to person, place, and time. She appears well-developed and well-nourished. No distress.  HENT:  Head: Normocephalic and atraumatic.  Mouth/Throat: Oropharynx is clear and moist.  Eyes: Conjunctivae and EOM are normal. Pupils are equal, round, and reactive to light.  Neck: Normal range of motion. Neck supple.  Cardiovascular: Normal rate, regular rhythm and intact distal pulses.  No murmur heard. Pulmonary/Chest: Effort normal and breath sounds normal. No respiratory distress. She has no wheezes. She has no rales.  Abdominal: Soft. She exhibits no distension. There is tenderness in the epigastric area. There is no rebound and no guarding.  Musculoskeletal: Normal range of motion. She exhibits no edema or tenderness.       Arms: Neurological: She is alert and oriented to person, place, and time.  Skin: Skin is warm and dry. No rash noted.  No erythema.  Psychiatric: She has a normal mood and affect. Her behavior is normal.  Nursing note and vitals reviewed.    ED Treatments / Results  Labs (all labs ordered are listed, but only abnormal results are displayed) Labs Reviewed  CBC WITH DIFFERENTIAL/PLATELET - Abnormal; Notable for the following components:      Result Value   WBC 3.8 (*)    All other components within normal limits  COMPREHENSIVE METABOLIC PANEL - Abnormal; Notable for the following components:   Glucose, Bld 113 (*)    ALT 13 (*)    All other components within normal limits  URINALYSIS, ROUTINE W REFLEX MICROSCOPIC - Abnormal; Notable for the following components:   Color, Urine STRAW (*)  APPearance HAZY (*)    Hgb urine dipstick LARGE (*)    All other components within normal limits  URINALYSIS, MICROSCOPIC (REFLEX) - Abnormal; Notable for the following components:   Bacteria, UA MANY (*)    Squamous Epithelial / LPF 6-30 (*)    All other components within normal limits  LIPASE, BLOOD    EKG  EKG Interpretation None       Radiology Ct Abdomen Pelvis W Contrast  Result Date: 03/05/2017 CLINICAL DATA:  Abdominal distention. Increased belching and flatulence. Diarrhea. Symptoms for several weeks. EXAM: CT ABDOMEN AND PELVIS WITH CONTRAST TECHNIQUE: Multidetector CT imaging of the abdomen and pelvis was performed using the standard protocol following bolus administration of intravenous contrast. CONTRAST:  145mL ISOVUE-300 IOPAMIDOL (ISOVUE-300) INJECTION 61% COMPARISON:  None. FINDINGS: Lower chest: Mild dependent atelectasis for scarring in the posterior lungs. Hepatobiliary: Tiny hypodensity in the central right lobe of the liver on image 20 measures 3 mm. Gallstones are noted. Pancreas: Within normal limits.  No obvious mass. Spleen: Unremarkable Adrenals/Urinary Tract: Adrenal glands are within normal limits. Nonspecific subcentimeter hypodensity in the right kidney. Bladder is decompressed.  Stomach/Bowel: Small hiatal hernia. Stomach is decompressed. Duodenum is unremarkable. There is no evidence of small-bowel obstruction. Sigmoid diverticulosis is present without definitive evidence of acute diverticulitis. Moderate stool burden throughout the colon without disproportionate dilatation. Appendix is within normal limits and marked by an arrow. No obvious mass in the colon. Vascular/Lymphatic: No evidence of aortic aneurysm. No abnormal retroperitoneal adenopathy. Reproductive: The uterus is heterogeneous and lobulated most consistent with fibroids. Adnexa are within normal limits for age. Other: There is no free fluid. There is minimal laxity of the anterior abdominal wall at the umbilicus. No overt hernia. Musculoskeletal: Scoliosis is present. Stabilization hardware is present extending from the lower thoracic spine to L5. Advanced degenerative disc disease at L5-S1. IMPRESSION: Cholelithiasis Nonspecific tiny hypodensity in the liver. If there is a history of malignancy or high risk of malignancy, consider six-month follow-up MRI Nonspecific hypodensity in the right kidney Sigmoid diverticulosis without acute diverticulitis. Uterine fibroids. Scoliosis with metallic stabilization fusion. Electronically Signed   By: Marybelle Killings M.D.   On: 03/05/2017 09:49    Procedures Procedures (including critical care time)  Medications Ordered in ED Medications - No data to display   Initial Impression / Assessment and Plan / ED Course  I have reviewed the triage vital signs and the nursing notes.  Pertinent labs & imaging results that were available during my care of the patient were reviewed by me and considered in my medical decision making (see chart for details).     Patient presenting with upper abdominal discomfort which sounds like it could be GERD that is worsened over the last 6-8 months.  Patient does have epigastric discomfort but no hepatomegaly.  She is not an alcohol abuser and  does not use excessive NSAIDs.  She is not currently taking any medications regularly.  The lower abdominal pain to suggest abnormal pelvic pathology, appendicitis or diverticulitis.  Will rule out hepatitis, pancreatitis and do a CT to ensure no masses are present given her change in stools and abdominal discomfort.  9:53 AM Labs are reassuring.  CT shows cholelithiasis but patient does not have clinical findings concerning for cholecystitis.  Otherwise no acute findings.  She does not have high risk for liver metastases.  Will treat for GERD and have patient follow-up with her PCP if not improved.  Final Clinical Impressions(s) / ED Diagnoses  Final diagnoses:  Epigastric abdominal pain    ED Discharge Orders        Ordered    pantoprazole (PROTONIX) 40 MG tablet  Daily     03/05/17 0955       Blanchie Dessert, MD 03/05/17 [phone]

## 2017-07-10 ENCOUNTER — Other Ambulatory Visit: Payer: Self-pay | Admitting: Internal Medicine

## 2017-07-10 DIAGNOSIS — Z1231 Encounter for screening mammogram for malignant neoplasm of breast: Secondary | ICD-10-CM

## 2017-08-28 ENCOUNTER — Ambulatory Visit: Payer: BLUE CROSS/BLUE SHIELD

## 2017-09-24 ENCOUNTER — Emergency Department (HOSPITAL_BASED_OUTPATIENT_CLINIC_OR_DEPARTMENT_OTHER)
Admission: EM | Admit: 2017-09-24 | Discharge: 2017-09-24 | Disposition: A | Discharge status: Home / Self Care | Payer: BLUE CROSS/BLUE SHIELD | Attending: Emergency Medicine | Admitting: Emergency Medicine

## 2017-09-24 ENCOUNTER — Emergency Department (HOSPITAL_BASED_OUTPATIENT_CLINIC_OR_DEPARTMENT_OTHER): Payer: BLUE CROSS/BLUE SHIELD

## 2017-09-24 ENCOUNTER — Other Ambulatory Visit: Payer: Self-pay

## 2017-09-24 ENCOUNTER — Encounter (HOSPITAL_BASED_OUTPATIENT_CLINIC_OR_DEPARTMENT_OTHER): Payer: Self-pay | Admitting: Emergency Medicine

## 2017-09-24 DIAGNOSIS — R0602 Shortness of breath: Secondary | ICD-10-CM | POA: Diagnosis present

## 2017-09-24 DIAGNOSIS — R0981 Nasal congestion: Secondary | ICD-10-CM | POA: Diagnosis not present

## 2017-09-24 DIAGNOSIS — Z79899 Other long term (current) drug therapy: Secondary | ICD-10-CM | POA: Insufficient documentation

## 2017-09-24 DIAGNOSIS — R079 Chest pain, unspecified: Secondary | ICD-10-CM | POA: Insufficient documentation

## 2017-09-24 LAB — BASIC METABOLIC PANEL
Anion gap: 7 (ref 5–15)
BUN: 12 mg/dL (ref 6–20)
CHLORIDE: 105 mmol/L (ref 98–111)
CO2: 28 mmol/L (ref 22–32)
Calcium: 9.5 mg/dL (ref 8.9–10.3)
Creatinine, Ser: 0.76 mg/dL (ref 0.44–1.00)
GFR calc Af Amer: >60 mL/min (ref >60)
GFR calc non Af Amer: >60 mL/min (ref >60)
Glucose, Bld: 116 mg/dL — ABNORMAL HIGH (ref 70–99)
POTASSIUM: 3.9 mmol/L (ref 3.5–5.1)
Sodium: 140 mmol/L (ref 135–145)

## 2017-09-24 LAB — CBC
HEMATOCRIT: 37.6 % (ref 36.0–46.0)
Hemoglobin: 13.1 g/dL (ref 12.0–15.0)
MCH: 29.8 pg (ref 26.0–34.0)
MCHC: 34.8 g/dL (ref 30.0–36.0)
MCV: 85.6 fL (ref 78.0–100.0)
Platelets: 191 10*3/uL (ref 150–400)
RBC: 4.39 MIL/uL (ref 3.87–5.11)
RDW: 12.8 % (ref 11.5–15.5)
WBC: 4.4 10*3/uL (ref 4.0–10.5)

## 2017-09-24 LAB — TROPONIN I

## 2017-09-24 MED ORDER — FLUTICASONE PROPIONATE 50 MCG/ACT NA SUSP: 1.0000 | Freq: Every day | NASAL | 0 refills | Status: DC

## 2017-09-24 NOTE — ED Notes (Signed)
ED Provider at bedside. 

## 2017-09-24 NOTE — ED Triage Notes (Signed)
Had "heaviness to chest " this am but has now resolved.

## 2017-09-24 NOTE — ED Provider Notes (Signed)
[facility] EMERGENCY DEPARTMENT Provider Note   CSN: [id number] Arrival date & time: 09/24/17  1014     History   Chief Complaint Chief Complaint  Patient presents with  . Chest Pain    HPI Jenny Wright is a 52 y.o. female with history of scoliosis, Bell's palsy, factor V Leiden mutation, anemia who presents with a heaviness in her chest.  She denies chest pain.  She reports it was just a little more difficult to breathe overnight.  She reports she was at her doctor last week and was told to go to the emergency department if she had chest pain after an EKG was done.  Her doctor said that her machine was not reliable and she cannot be sure of the findings.  Patient denies any pain or pleuritic symptoms at this time.  She is not short of breath moment.  She reports  she could have some of her symptoms related to her sinuses that she feels like her left nostril is more clogged than the other.  She denies any recent long trips, surgeries, known cancer, history of blood clots, exogenous estrogen use.  She has not taken any medication at home for her symptoms. She reports she just wanted to be checked out.  HPI  Past Medical History:  Diagnosis Date  . Anemia   . Bell's palsy   . Factor V Leiden mutation complicating pregnancy ([place])   . Miscarriage   . Preeclampsia    pregnancy  . Scoliosis     Patient Active Problem List   Diagnosis Date Noted  . Tinea pedis 08/29/2012  . Onychomycosis 08/29/2012  . Rash and nonspecific skin eruption 08/29/2012  . Bell's palsy 02/13/2012  . Anemia 02/13/2012  . Uterine fibroid 02/13/2012  . Scoliosis 02/13/2012  . Antiphospholipid antibody positive 02/13/2012    Past Surgical History:  Procedure Laterality Date  . BACK SURGERY    . CESAREAN SECTION    . harrington rod  1980     OB History   None      Home Medications    Prior to Admission medications   Medication Sig Start Date End Date Taking? Authorizing Provider    acyclovir (ZOVIRAX) 400 MG tablet Take 1 tablet (400 mg total) by mouth 3 (three) times daily. 02/01/13   Allen Norris, MD  Cholecalciferol (VITAMIN D3) 1000 units CAPS Take by mouth.    [provider]  econazole nitrate 1 % cream Apply topically daily. 08/28/12   Debbrah Alar, NP  ferrous gluconate (FERGON) 324 MG tablet Take 324 mg by mouth daily with breakfast.    [provider]  ferrous sulfate 325 (65 FE) MG tablet Take 325 mg by mouth daily with breakfast.    [provider]  fluticasone (FLONASE) 50 MCG/ACT nasal spray Place 1 spray into both nostrils daily. 09/24/17   Jerlene Rockers, Bea Graff, PA-C  ibuprofen (ADVIL,MOTRIN) 800 MG tablet Take 800 mg by mouth every 8 (eight) hours as needed.    [provider]  LYSINE PO Take 1 tablet by mouth daily.    [provider]  medroxyPROGESTERone (PROVERA) 10 MG tablet Take 1 tablet (10 mg total) by mouth daily. 07/14/13   Charlann Lange, PA-C  Multiple Vitamin (MULTIVITAMIN) capsule Take 1 capsule by mouth daily.    [provider]  pantoprazole (PROTONIX) 40 MG tablet Take 1 tablet (40 mg total) by mouth daily. 03/05/17   Blanchie Dessert, MD  Pyridoxine HCl (VITAMIN B-6 PO)  Take 1 tablet by mouth daily.    [provider]    Family History Family History  Problem Relation Age of Onset  . Breast cancer Mother        deceased  . Heart disease Father        deceased  . Lung cancer Brother        deceased    Social History Social History   Tobacco Use  . Smoking status: Never Smoker  . Smokeless tobacco: Never Used  Substance Use Topics  . Alcohol use: No    Frequency: Never  . Drug use: No     Allergies   Penicillins   Review of Systems Review of Systems  Constitutional: Negative for chills and fever.  HENT: Positive for congestion. Negative for facial swelling and sore throat.   Respiratory: Positive for chest tightness Danielle Dess) and shortness of  breath.   Cardiovascular: Negative for chest pain.  Gastrointestinal: Negative for abdominal pain, nausea and vomiting.  Genitourinary: Negative for dysuria.  Musculoskeletal: Negative for back pain.  Skin: Negative for rash and wound.  Neurological: Negative for headaches.  Psychiatric/Behavioral: The patient is not nervous/anxious.      Physical Exam Updated Vital Signs BP (!) 144/78 (BP Location: Right Arm)   Pulse 60   Temp 98.1 F (36.7 C) (Oral)   Resp 18   Ht 5\' 7"  (1.702 m)   Wt 84.8 kg (187 lb)   LMP 06/20/2017   SpO2 99%   BMI 29.29 kg/m   Physical Exam  Constitutional: She appears well-developed and well-nourished. No distress.  HENT:  Head: Normocephalic and atraumatic.  Mouth/Throat: Oropharynx is clear and moist. No oropharyngeal exudate.  Eyes: Pupils are equal, round, and reactive to light. Conjunctivae are normal. Right eye exhibits no discharge. Left eye exhibits no discharge. No scleral icterus.  Neck: Normal range of motion. Neck supple. No thyromegaly present.  Cardiovascular: Normal rate, regular rhythm, normal heart sounds and intact distal pulses. Exam reveals no gallop and no friction rub.  No murmur heard. Pulmonary/Chest: Effort normal and breath sounds normal. No stridor. No respiratory distress. She has no wheezes. She has no rales.  Abdominal: Soft. Bowel sounds are normal. She exhibits no distension. There is no tenderness. There is no rebound and no guarding.  Musculoskeletal: She exhibits no edema.       Right lower leg: She exhibits no tenderness and no edema.       Left lower leg: She exhibits no tenderness and no edema.  Lymphadenopathy:    She has no cervical adenopathy.  Neurological: She is alert. Coordination normal.  Skin: Skin is warm and dry. No rash noted. She is not diaphoretic. No pallor.  Psychiatric: She has a normal mood and affect.  Nursing note and vitals reviewed.    ED Treatments / Results  Labs (all labs ordered  are listed, but only abnormal results are displayed) Labs Reviewed  BASIC METABOLIC PANEL - Abnormal; Notable for the following components:      Result Value   Glucose, Bld 116 (*)    All other components within normal limits  CBC  TROPONIN I    EKG EKG Interpretation  Date/Time:  Sunday September 24 2017 10:20:18 EDT Ventricular Rate:  73 PR Interval:  142 QRS Duration: 72 QT Interval:  378 QTC Calculation: 416 R Axis:   71 Text Interpretation:  Normal sinus rhythm No significant change since last tracing Reconfirmed by Davonna Belling [phone]) on 09/24/2017 12:00:31 PM  Radiology Dg Chest 2 View  Result Date: 09/24/2017 CLINICAL DATA:  Shortness of breath and chest pressure EXAM: CHEST - 2 VIEW COMPARISON:  None. FINDINGS: Lungs are clear. The heart size and pulmonary vascularity are normal. No adenopathy. There is thoracolumbar levoscoliosis. There is rod fixation in the lower thoracic and upper lumbar regions. IMPRESSION: No edema or consolidation. Thoracolumbar scoliosis noted. Heart size normal. Electronically Signed   By: Lowella Grip III M.D.   On: 09/24/2017 11:02    Procedures Procedures (including critical care time)  Medications Ordered in ED Medications - No data to display   Initial Impression / Assessment and Plan / ED Course  I have reviewed the triage vital signs and the nursing notes.  Pertinent labs & imaging results that were available during my care of the patient were reviewed by me and considered in my medical decision making (see chart for details).     Patient presenting with self-reported abnormal breathing pattern and a heavy feeling in the chest.  She denies any chest pain or heaviness now.  She is very well-appearing with equal expansion of the chest.  Labs are unremarkable.  Chest x-ray is negative.  EKG shows NSR and no change since last tracing.  Using shared decision making, patient decided not to stay for repeat troponin.  I feel this is  reasonable as symptoms have been constant for around 12 hours.  Patient does have some nasal congestion that could be contributing to her symptoms and her abnormal breathing pattern and Flonase was recommended.  Patient also reports she is gained weight because she has not been exercising and she has not used it when she is carrying.  Will have patient follow-up with cardiology for further evaluation and she is given strict return precautions.  Patient understands and agrees with plan.  Patient vitals stable throughout ED course and discharged in satisfactory condition. I discussed patient case with Dr. Alvino Chapel who guided the patient's management and agrees with plan.   Final Clinical Impressions(s) / ED Diagnoses   Final diagnoses:  Shortness of breath    ED Discharge Orders        Ordered    fluticasone (FLONASE) 50 MCG/ACT nasal spray  Daily     09/24/17 [address], PA-C 09/25/17 Clenton Pare, MD 09/26/17 [phone]

## 2017-09-24 NOTE — Discharge Instructions (Signed)
Begin using Flonase to help with your nasal congestion.  Please follow-up with your doctor and/or the cardiologist for further evaluation and complete cardiac testing.  Please return to the emergency department if you develop any new or worsening symptoms.

## 2017-11-24 ENCOUNTER — Ambulatory Visit
Admission: RE | Admit: 2017-11-24 | Discharge: 2017-11-24 | Disposition: A | Discharge status: Home / Self Care | Payer: BLUE CROSS/BLUE SHIELD | Source: Ambulatory Visit | Attending: Internal Medicine | Admitting: Internal Medicine

## 2017-11-24 DIAGNOSIS — Z1231 Encounter for screening mammogram for malignant neoplasm of breast: Secondary | ICD-10-CM

## 2017-11-27 ENCOUNTER — Ambulatory Visit: Payer: BLUE CROSS/BLUE SHIELD

## 2017-11-30 ENCOUNTER — Other Ambulatory Visit: Payer: Self-pay | Admitting: Internal Medicine

## 2017-11-30 DIAGNOSIS — N6310 Unspecified lump in the right breast, unspecified quadrant: Secondary | ICD-10-CM

## 2017-11-30 DIAGNOSIS — R928 Other abnormal and inconclusive findings on diagnostic imaging of breast: Secondary | ICD-10-CM

## 2017-12-04 ENCOUNTER — Ambulatory Visit
Admission: RE | Admit: 2017-12-04 | Discharge: 2017-12-04 | Disposition: A | Discharge status: Home / Self Care | Payer: BLUE CROSS/BLUE SHIELD | Source: Ambulatory Visit | Attending: Internal Medicine | Admitting: Internal Medicine

## 2017-12-04 DIAGNOSIS — R928 Other abnormal and inconclusive findings on diagnostic imaging of breast: Secondary | ICD-10-CM

## 2017-12-04 DIAGNOSIS — N6310 Unspecified lump in the right breast, unspecified quadrant: Secondary | ICD-10-CM

## 2018-01-29 ENCOUNTER — Ambulatory Visit: Payer: BLUE CROSS/BLUE SHIELD

## 2018-12-11 ENCOUNTER — Other Ambulatory Visit: Payer: Self-pay | Admitting: Cardiology

## 2018-12-11 DIAGNOSIS — N644 Mastodynia: Secondary | ICD-10-CM

## 2018-12-14 ENCOUNTER — Other Ambulatory Visit: Payer: Self-pay

## 2018-12-14 ENCOUNTER — Ambulatory Visit: Payer: BLUE CROSS/BLUE SHIELD

## 2018-12-14 ENCOUNTER — Ambulatory Visit
Admission: RE | Admit: 2018-12-14 | Discharge: 2018-12-14 | Disposition: A | Discharge status: Home / Self Care | Payer: BC Managed Care – PPO | Source: Ambulatory Visit | Attending: Cardiology | Admitting: Cardiology

## 2018-12-14 DIAGNOSIS — N644 Mastodynia: Secondary | ICD-10-CM

## 2019-01-06 ENCOUNTER — Other Ambulatory Visit: Payer: Self-pay

## 2019-01-06 ENCOUNTER — Encounter (HOSPITAL_BASED_OUTPATIENT_CLINIC_OR_DEPARTMENT_OTHER): Payer: Self-pay

## 2019-01-06 ENCOUNTER — Emergency Department (HOSPITAL_BASED_OUTPATIENT_CLINIC_OR_DEPARTMENT_OTHER)
Admission: EM | Admit: 2019-01-06 | Discharge: 2019-01-06 | Disposition: A | Discharge status: Home / Self Care | Payer: BC Managed Care – PPO | Attending: Emergency Medicine | Admitting: Emergency Medicine

## 2019-01-06 DIAGNOSIS — Z79899 Other long term (current) drug therapy: Secondary | ICD-10-CM | POA: Insufficient documentation

## 2019-01-06 DIAGNOSIS — R002 Palpitations: Secondary | ICD-10-CM | POA: Diagnosis present

## 2019-01-06 DIAGNOSIS — R202 Paresthesia of skin: Secondary | ICD-10-CM | POA: Diagnosis not present

## 2019-01-06 DIAGNOSIS — Z7982 Long term (current) use of aspirin: Secondary | ICD-10-CM | POA: Diagnosis not present

## 2019-01-06 DIAGNOSIS — Z880 Allergy status to penicillin: Secondary | ICD-10-CM | POA: Insufficient documentation

## 2019-01-06 LAB — CBC WITH DIFFERENTIAL/PLATELET
Abs Immature Granulocytes: 0.01 10*3/uL (ref 0.00–0.07)
Basophils Absolute: 0 10*3/uL (ref 0.0–0.1)
Basophils Relative: 0 %
Eosinophils Absolute: 0.1 10*3/uL (ref 0.0–0.5)
Eosinophils Relative: 1 %
HCT: 35.5 % — ABNORMAL LOW (ref 36.0–46.0)
Hemoglobin: 11.6 g/dL — ABNORMAL LOW (ref 12.0–15.0)
Immature Granulocytes: 0 %
Lymphocytes Relative: 46 %
Lymphs Abs: 2.3 10*3/uL (ref 0.7–4.0)
MCH: 29.3 pg (ref 26.0–34.0)
MCHC: 32.7 g/dL (ref 30.0–36.0)
MCV: 89.6 fL (ref 80.0–100.0)
Monocytes Absolute: 0.4 10*3/uL (ref 0.1–1.0)
Monocytes Relative: 8 %
Neutro Abs: 2.3 10*3/uL (ref 1.7–7.7)
Neutrophils Relative %: 45 %
Platelets: 220 10*3/uL (ref 150–400)
RBC: 3.96 MIL/uL (ref 3.87–5.11)
RDW: 12.2 % (ref 11.5–15.5)
WBC: 5.1 10*3/uL (ref 4.0–10.5)
nRBC: 0 % (ref 0.0–0.2)

## 2019-01-06 LAB — BASIC METABOLIC PANEL
Anion gap: 9 (ref 5–15)
BUN: 18 mg/dL (ref 6–20)
CO2: 26 mmol/L (ref 22–32)
Calcium: 9.4 mg/dL (ref 8.9–10.3)
Chloride: 104 mmol/L (ref 98–111)
Creatinine, Ser: 0.89 mg/dL (ref 0.44–1.00)
GFR calc Af Amer: >60 mL/min (ref >60)
GFR calc non Af Amer: >60 mL/min (ref >60)
Glucose, Bld: 113 mg/dL — ABNORMAL HIGH (ref 70–99)
Potassium: 3.8 mmol/L (ref 3.5–5.1)
Sodium: 139 mmol/L (ref 135–145)

## 2019-01-06 LAB — TROPONIN I (HIGH SENSITIVITY): Troponin I (High Sensitivity): 2 ng/L (ref <18)

## 2019-01-06 MED ORDER — PANTOPRAZOLE SODIUM 40 MG IV SOLR
40.0000 mg | Freq: Once | INTRAVENOUS | Status: AC
  Administered 2019-01-06: 40 mg via INTRAVENOUS
  Filled 2019-01-06: qty 40

## 2019-01-06 NOTE — ED Notes (Signed)
ED Provider at bedside. 

## 2019-01-06 NOTE — ED Provider Notes (Signed)
[facility] Provider Note: [name], MD, FACEP  CSN: [id number] MRN: [id number] ARRIVAL: 01/06/19 at [facility]: [place]  Palpitations   HISTORY OF PRESENT ILLNESS  01/06/19 4:13 AM Jenny Wright is a 53 y.o. female who is been having a vague sensation that began in her anterior chest yesterday morning.  She describes a sensation as brief, fleeting, sharp, electric-like sensations that now involve her neck, her head and her upper arms.  She states it feels like something is going on in her veins and in her brain.  She also describes the sensation as feeling like muscle spasms.  The sensations are not frankly painful and she rates her pain actually is a 0 out of 10.  She has no associated shortness of breath, nausea, vomiting, diaphoresis, fever or chills.  Nothing seems to make the symptoms better or worse.  She has a history of heartburn but states this is different.  She has had some belching or at least the sensation she needs to belch.   Past Medical History:  Diagnosis Date  . Anemia   . Bell's palsy   . Factor V Leiden mutation complicating pregnancy ([place])   . Miscarriage   . Preeclampsia    pregnancy  . Scoliosis     Past Surgical History:  Procedure Laterality Date  . BACK SURGERY    . CESAREAN SECTION    . harrington rod  1980    Family History  Problem Relation Age of Onset  . Breast cancer Mother        deceased  . Heart disease Father        deceased  . Lung cancer Brother        deceased    Social History   Tobacco Use  . Smoking status: Never Smoker  . Smokeless tobacco: Never Used  Substance Use Topics  . Alcohol use: No    Frequency: Never  . Drug use: No    Prior to Admission medications   Medication Sig Start Date End Date Taking? Authorizing Provider  acyclovir (ZOVIRAX) 400 MG tablet Take 1 tablet (400 mg total) by mouth 3 (three) times daily. 02/01/13  Yes Allen Norris, MD  aspirin 81 MG  chewable tablet Chew 81 mg by mouth daily.   Yes [provider]  Cholecalciferol (VITAMIN D3) 1000 units CAPS Take by mouth.   Yes [provider]  ferrous gluconate (FERGON) 324 MG tablet Take 324 mg by mouth daily with breakfast.   Yes [provider]  ferrous sulfate 325 (65 FE) MG tablet Take 325 mg by mouth daily with breakfast.   Yes [provider]  LYSINE PO Take 1 tablet by mouth daily.   Yes [provider]  medroxyPROGESTERone (PROVERA) 10 MG tablet Take 1 tablet (10 mg total) by mouth daily. 07/14/13  Yes Charlann Lange, PA-C  Multiple Vitamin (MULTIVITAMIN) capsule Take 1 capsule by mouth daily.   Yes [provider]  econazole nitrate 1 % cream Apply topically daily. 08/28/12   Debbrah Alar, NP  fluticasone (FLONASE) 50 MCG/ACT nasal spray Place 1 spray into both nostrils daily. 09/24/17   Law, Bea Graff, PA-C  ibuprofen (ADVIL,MOTRIN) 800 MG tablet Take 800 mg by mouth every 8 (eight) hours as needed.    [provider]  pantoprazole (PROTONIX) 40 MG tablet Take 1 tablet (40 mg total) by mouth daily. 03/05/17   Blanchie Dessert, MD  Pyridoxine HCl (VITAMIN B-6  PO) Take 1 tablet by mouth daily.    [provider]    Allergies Penicillins   REVIEW OF SYSTEMS  Negative except as noted here or in the History of Present Illness.   PHYSICAL EXAMINATION  Initial Vital Signs Blood pressure (!) 157/88, pulse 74, temperature 98.7 F (37.1 C), temperature source Oral, resp. rate 18, height 5\' 7"  (1.702 m), weight 82.6 kg, SpO2 97 %.  Examination General: Well-developed, well-nourished female in no acute distress; appearance consistent with age of record HENT: normocephalic; atraumatic Eyes: pupils equal, round and reactive to light; extraocular muscles intact Neck: supple Heart: regular rate and rhythm Lungs: clear to auscultation bilaterally Chest: Nontender Abdomen: soft; nondistended; nontender;  bowel sounds present Extremities: No deformity; full range of motion; pulses normal Neurologic: Awake, alert and oriented; motor function intact in all extremities and symmetric; no facial droop Skin: Warm and dry Psychiatric: Normal mood and affect   RESULTS  Summary of this visit's results, reviewed by myself:   EKG Interpretation  Date/Time:  Sunday January 06 2019 04:13:27 EDT Ventricular Rate:  66 PR Interval:    QRS Duration: 79 QT Interval:  388 QTC Calculation: 407 R Axis:   60 Text Interpretation:  Sinus rhythm No significant change was found Confirmed by Shanon Rosser [phone]) on 01/06/2019 4:16:25 AM      Laboratory Studies: Results for orders placed or performed during the hospital encounter of 01/06/19 (from the past 24 hour(s))  CBC with Differential/Platelet     Status: Abnormal   Collection Time: 01/06/19  4:35 AM  Result Value Ref Range   WBC 5.1 4.0 - 10.5 K/uL   RBC 3.96 3.87 - 5.11 MIL/uL   Hemoglobin 11.6 (L) 12.0 - 15.0 g/dL   HCT 35.5 (L) 36.0 - 46.0 %   MCV 89.6 80.0 - 100.0 fL   MCH 29.3 26.0 - 34.0 pg   MCHC 32.7 30.0 - 36.0 g/dL   RDW 12.2 11.5 - 15.5 %   Platelets 220 150 - 400 K/uL   nRBC 0.0 0.0 - 0.2 %   Neutrophils Relative % 45 %   Neutro Abs 2.3 1.7 - 7.7 K/uL   Lymphocytes Relative 46 %   Lymphs Abs 2.3 0.7 - 4.0 K/uL   Monocytes Relative 8 %   Monocytes Absolute 0.4 0.1 - 1.0 K/uL   Eosinophils Relative 1 %   Eosinophils Absolute 0.1 0.0 - 0.5 K/uL   Basophils Relative 0 %   Basophils Absolute 0.0 0.0 - 0.1 K/uL   Immature Granulocytes 0 %   Abs Immature Granulocytes 0.01 0.00 - 0.07 K/uL  Basic metabolic panel     Status: Abnormal   Collection Time: 01/06/19  4:35 AM  Result Value Ref Range   Sodium 139 135 - 145 mmol/L   Potassium 3.8 3.5 - 5.1 mmol/L   Chloride 104 98 - 111 mmol/L   CO2 26 22 - 32 mmol/L   Glucose, Bld 113 (H) 70 - 99 mg/dL   BUN 18 6 - 20 mg/dL   Creatinine, Ser 0.89 0.44 - 1.00 mg/dL   Calcium 9.4 8.9  - 10.3 mg/dL   GFR calc non Af Amer >60 >60 mL/min   GFR calc Af Amer >60 >60 mL/min   Anion gap 9 5 - 15  Troponin I (High Sensitivity)     Status: None   Collection Time: 01/06/19  4:35 AM  Result Value Ref Range   Troponin I (High Sensitivity) 2 <18 ng/L  Imaging Studies: No results found.  ED COURSE and MDM  Nursing notes and initial vitals signs, including pulse oximetry, reviewed.  Vitals:   01/06/19 0407 01/06/19 0408 01/06/19 0445  BP: (!) 157/88  (!) 142/86  Pulse: 74  65  Resp: 18  12  Temp: 98.7 F (37.1 C)    TempSrc: Oral    SpO2: 97%  99%  Weight:  82.6 kg   Height:  5\' 7"  (1.702 m)    Patient symptoms are very atypical for cardiac etiology.  She complains of sharp, pricking sensations over her chest, arms, neck and head. These are more consistent with paresthesias.  She has no other symptoms such as shortness of breath, nausea or diaphoresis.  Believe she is safe for discharge with primary care follow-up or return should symptoms worsen or change.  PROCEDURES    ED DIAGNOSES     ICD-10-CM   1. Paresthesias  R20.2        Shanon Rosser, MD 01/06/19 [phone]

## 2019-01-06 NOTE — ED Triage Notes (Signed)
Patient c/o palpitations which started yesterday as well as "tingling" sensations in her BUE, chest and neck.

## 2019-01-31 ENCOUNTER — Other Ambulatory Visit: Payer: Self-pay

## 2019-01-31 DIAGNOSIS — R102 Pelvic and perineal pain: Secondary | ICD-10-CM | POA: Diagnosis present

## 2019-01-31 DIAGNOSIS — Z880 Allergy status to penicillin: Secondary | ICD-10-CM | POA: Insufficient documentation

## 2019-01-31 DIAGNOSIS — M545 Low back pain: Secondary | ICD-10-CM | POA: Insufficient documentation

## 2019-01-31 DIAGNOSIS — Z79899 Other long term (current) drug therapy: Secondary | ICD-10-CM | POA: Diagnosis not present

## 2019-01-31 DIAGNOSIS — Z7982 Long term (current) use of aspirin: Secondary | ICD-10-CM | POA: Insufficient documentation

## 2019-02-01 ENCOUNTER — Ambulatory Visit (HOSPITAL_BASED_OUTPATIENT_CLINIC_OR_DEPARTMENT_OTHER)
Admit: 2019-02-01 | Discharge: 2019-02-01 | Disposition: A | Discharge status: Home / Self Care | Payer: BC Managed Care – PPO | Attending: Emergency Medicine | Admitting: Emergency Medicine

## 2019-02-01 ENCOUNTER — Other Ambulatory Visit (HOSPITAL_BASED_OUTPATIENT_CLINIC_OR_DEPARTMENT_OTHER): Payer: BC Managed Care – PPO

## 2019-02-01 ENCOUNTER — Emergency Department (HOSPITAL_BASED_OUTPATIENT_CLINIC_OR_DEPARTMENT_OTHER)
Admission: EM | Admit: 2019-02-01 | Discharge: 2019-02-01 | Disposition: A | Discharge status: Home / Self Care | Payer: BC Managed Care – PPO | Attending: Emergency Medicine | Admitting: Emergency Medicine

## 2019-02-01 ENCOUNTER — Encounter (HOSPITAL_BASED_OUTPATIENT_CLINIC_OR_DEPARTMENT_OTHER): Payer: Self-pay | Admitting: *Deleted

## 2019-02-01 DIAGNOSIS — R102 Pelvic and perineal pain: Secondary | ICD-10-CM

## 2019-02-01 LAB — URINALYSIS, ROUTINE W REFLEX MICROSCOPIC
Bilirubin Urine: NEGATIVE
Glucose, UA: NEGATIVE mg/dL
Ketones, ur: NEGATIVE mg/dL
Leukocytes,Ua: NEGATIVE
Nitrite: NEGATIVE
Protein, ur: NEGATIVE mg/dL
Specific Gravity, Urine: >1.03 — ABNORMAL HIGH (ref 1.005–1.030)
pH: 5.5 (ref 5.0–8.0)

## 2019-02-01 LAB — URINALYSIS, MICROSCOPIC (REFLEX)

## 2019-02-01 MED ORDER — IBUPROFEN 800 MG PO TABS
800.0000 mg | ORAL_TABLET | Freq: Once | ORAL | Status: AC
  Administered 2019-02-01: 800 mg via ORAL
  Filled 2019-02-01: qty 1

## 2019-02-01 NOTE — ED Triage Notes (Signed)
Pt. Goes back to say she is a medium BM person and goes every other day and is now having really soft bms in the past 2 days.  Pt. Very talky and goes on and on about the pain and discomfort.

## 2019-02-01 NOTE — ED Provider Notes (Signed)
[facility] Provider Note: Jenny Spurling, MD, FACEP  CSN: [id number] MRN: [id number] ARRIVAL: 01/31/19 at 2340 ROOM: [place]  Abdominal Pain and Back Pain   HISTORY OF PRESENT ILLNESS  02/01/19 12:23 AM Jenny Wright is a 53 y.o. female who is perimenopausal.  She was placed on a 10-day course of progesterone on 01/05/2019.  She never had bleeding throughout or after completing the course.  She is now having, for 2 days, pelvic and back pain which she describes as feeling like labor cramps.  She rates the pain as a 10 out of 10.  She denies any urinary complaints.  She does have some paresthesias of her chest but she has had those in the past.  She attributes her symptoms to the progesterone.  She contacted her OB/GYN who recommended she be seen in the ED and she is requesting an ultrasound.  She is also complaining of having softer bowel movements than usual for the past 2 days as well.  She has been taking acetaminophen without relief of her symptoms.   Past Medical History:  Diagnosis Date  . Anemia   . Bell's palsy   . Factor V Leiden mutation complicating pregnancy ([place])   . Miscarriage   . Preeclampsia    pregnancy  . Scoliosis     Past Surgical History:  Procedure Laterality Date  . BACK SURGERY    . CESAREAN SECTION    . harrington rod  1980    Family History  Problem Relation Age of Onset  . Breast cancer Mother        deceased  . Heart disease Father        deceased  . Lung cancer Brother        deceased    Social History   Tobacco Use  . Smoking status: Never Smoker  . Smokeless tobacco: Never Used  Substance Use Topics  . Alcohol use: No    Frequency: Never  . Drug use: No    Prior to Admission medications   Medication Sig Start Date End Date Taking? Authorizing Provider  aspirin 81 MG chewable tablet Chew 81 mg by mouth daily.    [provider]  Cholecalciferol (VITAMIN D3) 1000 units CAPS Take  by mouth.    [provider]  ferrous gluconate (FERGON) 324 MG tablet Take 324 mg by mouth daily with breakfast.    [provider]  ferrous sulfate 325 (65 FE) MG tablet Take 325 mg by mouth daily with breakfast.    [provider]  LYSINE PO Take 1 tablet by mouth daily.    [provider]  Multiple Vitamin (MULTIVITAMIN) capsule Take 1 capsule by mouth daily.    [provider]  fluticasone (FLONASE) 50 MCG/ACT nasal spray Place 1 spray into both nostrils daily. 09/24/17 01/06/19  Frederica Kuster, PA-C  medroxyPROGESTERone (PROVERA) 10 MG tablet Take 1 tablet (10 mg total) by mouth daily. 07/14/13 02/01/19  Charlann Lange, PA-C  pantoprazole (PROTONIX) 40 MG tablet Take 1 tablet (40 mg total) by mouth daily. 03/05/17 01/06/19  Blanchie Dessert, MD    Allergies Penicillins   REVIEW OF SYSTEMS  Negative except as noted here or in the History of Present Illness.   PHYSICAL EXAMINATION  Initial Vital Signs Blood pressure (!) 146/87, pulse 67, temperature 99.2 F (37.3 C), temperature source Oral, resp. rate 17, height 5\' 6"  (1.676 m), weight 80.7 kg, SpO2 100 %.  Examination General:  Well-developed, well-nourished female in no acute distress; appearance consistent with age of record HENT: normocephalic; atraumatic Eyes: pupils equal, round and reactive to light; extraocular muscles intact Neck: supple Heart: regular rate and rhythm Lungs: clear to auscultation bilaterally Abdomen: soft; nondistended; nontender; bowel sounds present Extremities: No deformity; full range of motion; pulses normal Neurologic: Awake, alert and oriented; motor function intact in all extremities and symmetric; no facial droop Skin: Warm and dry Psychiatric: Normal mood and affect   RESULTS  Summary of this visit's results, reviewed and interpreted by myself:   EKG Interpretation  Date/Time:    Ventricular Rate:    PR Interval:    QRS Duration:   QT  Interval:    QTC Calculation:   R Axis:     Text Interpretation:        Laboratory Studies: Results for orders placed or performed during the hospital encounter of 02/01/19 (from the past 24 hour(s))  Urinalysis, Routine w reflex microscopic     Status: Abnormal   Collection Time: 02/01/19 12:45 AM  Result Value Ref Range   Color, Urine YELLOW YELLOW   APPearance CLOUDY (A) CLEAR   Specific Gravity, Urine >1.030 (H) 1.005 - 1.030   pH 5.5 5.0 - 8.0   Glucose, UA NEGATIVE NEGATIVE mg/dL   Hgb urine dipstick SMALL (A) NEGATIVE   Bilirubin Urine NEGATIVE NEGATIVE   Ketones, ur NEGATIVE NEGATIVE mg/dL   Protein, ur NEGATIVE NEGATIVE mg/dL   Nitrite NEGATIVE NEGATIVE   Leukocytes,Ua NEGATIVE NEGATIVE  Urinalysis, Microscopic (reflex)     Status: Abnormal   Collection Time: 02/01/19 12:45 AM  Result Value Ref Range   RBC / HPF 0-5 0 - 5 RBC/hpf   WBC, UA 6-10 0 - 5 WBC/hpf   Bacteria, UA MANY (A) NONE SEEN   Squamous Epithelial / LPF 0-5 0 - 5   Mucus PRESENT    Hyaline Casts, UA PRESENT    Imaging Studies: No results found.  ED COURSE and MDM  Nursing notes, initial and subsequent vitals signs, including pulse oximetry, reviewed and interpreted by myself.  Vitals:   02/01/19 0008 02/01/19 0010  BP: (!) 146/87   Pulse: 67   Resp: 17   Temp: 99.2 F (37.3 C)   TempSrc: Oral   SpO2: 100%   Weight:  80.7 kg  Height:  5\' 6"  (1.676 m)   Medications  ibuprofen (ADVIL) tablet 800 mg (800 mg Oral Given 02/01/19 0039)    We will arrange an outpatient ultrasound of the pelvis for the patient and have her follow back up with her OB/GYN.  PROCEDURES  Procedures   ED DIAGNOSES     ICD-10-CM   1. Pelvic pain in female  R10.2        Mardee Clune, MD 02/01/19 [phone]

## 2019-02-01 NOTE — ED Triage Notes (Signed)
Pt. Reports she has back pain and pelvic pain that started 2 days ago with normal urination and normal BMs.  Pt. Reports she started taking Progesterone on 01-05-2019 and stopped 10 days later and she feels like this pain may be coming from the medicine.  She reports she never started  Bleed after taking the meds.

## 2019-03-19 DIAGNOSIS — J392 Other diseases of pharynx: Secondary | ICD-10-CM | POA: Insufficient documentation

## 2019-03-19 HISTORY — DX: Other diseases of pharynx: J39.2

## 2019-08-19 ENCOUNTER — Encounter (HOSPITAL_BASED_OUTPATIENT_CLINIC_OR_DEPARTMENT_OTHER): Payer: Self-pay

## 2019-08-19 ENCOUNTER — Emergency Department (HOSPITAL_BASED_OUTPATIENT_CLINIC_OR_DEPARTMENT_OTHER)
Admission: EM | Admit: 2019-08-19 | Discharge: 2019-08-20 | Disposition: A | Discharge status: Home / Self Care | Payer: BC Managed Care – PPO | Attending: Emergency Medicine | Admitting: Emergency Medicine

## 2019-08-19 ENCOUNTER — Other Ambulatory Visit: Payer: Self-pay

## 2019-08-19 DIAGNOSIS — R109 Unspecified abdominal pain: Secondary | ICD-10-CM | POA: Diagnosis present

## 2019-08-19 DIAGNOSIS — K648 Other hemorrhoids: Secondary | ICD-10-CM | POA: Diagnosis not present

## 2019-08-19 DIAGNOSIS — Z7982 Long term (current) use of aspirin: Secondary | ICD-10-CM | POA: Diagnosis not present

## 2019-08-19 DIAGNOSIS — Z79899 Other long term (current) drug therapy: Secondary | ICD-10-CM | POA: Insufficient documentation

## 2019-08-19 DIAGNOSIS — K625 Hemorrhage of anus and rectum: Secondary | ICD-10-CM

## 2019-08-19 NOTE — ED Triage Notes (Signed)
Abd pain x2 weeks, c/o fullness and bloating. States BM have blood. Unable to get appt with PCP

## 2019-08-20 LAB — URINALYSIS, ROUTINE W REFLEX MICROSCOPIC
Bilirubin Urine: NEGATIVE
Glucose, UA: NEGATIVE mg/dL
Hgb urine dipstick: NEGATIVE
Ketones, ur: NEGATIVE mg/dL
Leukocytes,Ua: NEGATIVE
Nitrite: NEGATIVE
Protein, ur: NEGATIVE mg/dL
Specific Gravity, Urine: 1.02 (ref 1.005–1.030)
pH: 6.5 (ref 5.0–8.0)

## 2019-08-20 LAB — CBC
HCT: 36.9 % (ref 36.0–46.0)
Hemoglobin: 12.6 g/dL (ref 12.0–15.0)
MCH: 29.9 pg (ref 26.0–34.0)
MCHC: 34.1 g/dL (ref 30.0–36.0)
MCV: 87.4 fL (ref 80.0–100.0)
Platelets: 224 10*3/uL (ref 150–400)
RBC: 4.22 MIL/uL (ref 3.87–5.11)
RDW: 12.5 % (ref 11.5–15.5)
WBC: 5 10*3/uL (ref 4.0–10.5)
nRBC: 0 % (ref 0.0–0.2)

## 2019-08-20 LAB — COMPREHENSIVE METABOLIC PANEL WITH GFR
ALT: 14 U/L (ref 0–44)
AST: 16 U/L (ref 15–41)
Albumin: 4 g/dL (ref 3.5–5.0)
Alkaline Phosphatase: 68 U/L (ref 38–126)
Anion gap: 9 (ref 5–15)
BUN: 15 mg/dL (ref 6–20)
CO2: 25 mmol/L (ref 22–32)
Calcium: 9.1 mg/dL (ref 8.9–10.3)
Chloride: 104 mmol/L (ref 98–111)
Creatinine, Ser: 0.79 mg/dL (ref 0.44–1.00)
GFR calc Af Amer: >60 mL/min
GFR calc non Af Amer: >60 mL/min
Glucose, Bld: 126 mg/dL — ABNORMAL HIGH (ref 70–99)
Potassium: 4.1 mmol/L (ref 3.5–5.1)
Sodium: 138 mmol/L (ref 135–145)
Total Bilirubin: 0.6 mg/dL (ref 0.3–1.2)
Total Protein: 7.3 g/dL (ref 6.5–8.1)

## 2019-08-20 MED ORDER — SODIUM CHLORIDE 0.9% FLUSH
3.0000 mL | Freq: Once | INTRAVENOUS | Status: DC
  Filled 2019-08-20: qty 3

## 2019-08-20 MED ORDER — HYDROCORTISONE ACETATE 25 MG RE SUPP
25.0000 mg | Freq: Two times a day (BID) | RECTAL | 0 refills | Status: AC
Start: 2019-08-20 — End: 2019-09-03

## 2019-08-20 NOTE — ED Notes (Signed)
ED Provider at bedside. 

## 2019-08-20 NOTE — ED Provider Notes (Signed)
[facility] EMERGENCY DEPARTMENT Provider Note   CSN: [id number] Arrival date & time: 08/19/19  2330     History Chief Complaint  Patient presents with  . Abdominal Pain    Jenny Wright is a 54 y.o. female.  Patient with a history of hemorrhoids and has had some worsening pain recently with associated bleeding.  She had been seen by her doctor has a ED visit and was started on some Anusol which helped with some of the discomfort and the bleeding slowed down but is still having some bleeding.  She has it every 4 to 5 days.  Pain with hard bowel movements but has been doing stool softeners which is helped a lot.  Has some abdominal bloating.  She had a colonoscopy when she was 10 which was normal.  No nausea or vomiting.   Abdominal Pain      Past Medical History:  Diagnosis Date  . Anemia   . Bell's palsy   . Factor V Leiden mutation complicating pregnancy ([place])   . Miscarriage   . Preeclampsia    pregnancy  . Scoliosis     Patient Active Problem List   Diagnosis Date Noted  . Tinea pedis 08/29/2012  . Onychomycosis 08/29/2012  . Rash and nonspecific skin eruption 08/29/2012  . Bell's palsy 02/13/2012  . Anemia 02/13/2012  . Uterine fibroid 02/13/2012  . Scoliosis 02/13/2012  . Antiphospholipid antibody positive 02/13/2012    Past Surgical History:  Procedure Laterality Date  . BACK SURGERY    . CESAREAN SECTION    . harrington rod  1980     OB History   No obstetric history on file.     Family History  Problem Relation Age of Onset  . Breast cancer Mother        deceased  . Heart disease Father        deceased  . Lung cancer Brother        deceased    Social History   Tobacco Use  . Smoking status: Never Smoker  . Smokeless tobacco: Never Used  Substance Use Topics  . Alcohol use: No  . Drug use: No    Home Medications Prior to Admission medications   Medication Sig Start Date End Date Taking? Authorizing Provider    aspirin 81 MG chewable tablet Chew 81 mg by mouth daily.    [provider]  Cholecalciferol (VITAMIN D3) 1000 units CAPS Take by mouth.    [provider]  ferrous gluconate (FERGON) 324 MG tablet Take 324 mg by mouth daily with breakfast.    [provider]  ferrous sulfate 325 (65 FE) MG tablet Take 325 mg by mouth daily with breakfast.    [provider]  hydrocortisone (ANUSOL-HC) 25 MG suppository Place 1 suppository (25 mg total) rectally 2 (two) times daily for 14 days. For 7 days 08/20/19 09/03/19  Harriette Tovey, Corene Cornea, MD  LYSINE PO Take 1 tablet by mouth daily.    [provider]  Multiple Vitamin (MULTIVITAMIN) capsule Take 1 capsule by mouth daily.    [provider]  fluticasone (FLONASE) 50 MCG/ACT nasal spray Place 1 spray into both nostrils daily. 09/24/17 01/06/19  Frederica Kuster, PA-C  medroxyPROGESTERone (PROVERA) 10 MG tablet Take 1 tablet (10 mg total) by mouth daily. 07/14/13 02/01/19  Charlann Lange, PA-C  pantoprazole (PROTONIX) 40 MG tablet Take 1 tablet (40 mg total) by mouth daily. 03/05/17 01/06/19  Blanchie Dessert, MD  Allergies    Penicillins  Review of Systems   Review of Systems  Gastrointestinal: Positive for abdominal pain.  All other systems reviewed and are negative.   Physical Exam Updated Vital Signs BP (!) 148/82   Pulse 89   Temp 98.9 F (37.2 C) (Oral)   Resp 16   Ht 5' 6.5" (1.689 m)   Wt 81.6 kg   SpO2 98%   BMI 28.62 kg/m   Physical Exam Vitals and nursing note reviewed. Exam conducted with a chaperone present (NT - Jody).  Constitutional:      Appearance: She is well-developed.  HENT:     Head: Normocephalic and atraumatic.  Eyes:     Extraocular Movements: Extraocular movements intact.     Pupils: Pupils are equal, round, and reactive to light.  Cardiovascular:     Rate and Rhythm: Normal rate and regular rhythm.  Pulmonary:     Effort: Pulmonary effort is normal. No  respiratory distress.     Breath sounds: No stridor.  Abdominal:     General: Bowel sounds are normal. There is no distension.     Tenderness: There is no abdominal tenderness. There is no right CVA tenderness or left CVA tenderness.     Hernia: No hernia is present.  Genitourinary:    Exam position: Knee-chest position.     Rectum: Tenderness (of external hemorrhoid, no obvious fissures. ) present. No mass.  Musculoskeletal:     Cervical back: Normal range of motion.  Skin:    General: Skin is warm and dry.  Neurological:     Mental Status: She is alert.     ED Results / Procedures / Treatments   Labs (all labs ordered are listed, but only abnormal results are displayed) Labs Reviewed  COMPREHENSIVE METABOLIC PANEL - Abnormal; Notable for the following components:      Result Value   Glucose, Bld 126 (*)    All other components within normal limits  CBC  URINALYSIS, ROUTINE W REFLEX MICROSCOPIC    EKG None  Radiology No results found.  Procedures Procedures (including critical care time)  Procedure Note:  Anoscopy  Procedure: Anoscopy  Diagnosis: Rectal bleeding  Procedure performed by: myself  Informed Consent: Informed consent was obtained. Risks benefits alternatives of the procedure given to the patient. Chaperoned by NT - Jody  Verification: I have verified the correct patient, correct procedure, correct position, correct site/side, and available equipment.  Anesthesia/Sedation: None  Procedure Note: Universal precautions were observed. An anoscope was easily passed. Under direct visualization, found to have an internal hemorrhoid with stigmata of recent bleeding in posterior distal rectum. There was no evidence of foreign body. The patient tolerated the procedure well. There was no blood loss. There were no complications.  Medications Ordered in ED Medications - No data to display  ED Course  I have reviewed the triage vital signs and the nursing  notes.  Pertinent labs & imaging results that were available during my care of the patient were reviewed by me and considered in my medical decision making (see chart for details).    MDM Rules/Calculators/A&P                      Bleeding internal hemorrhoid without evidence of acute blood loss anemia. Will retry longer course and increased frequency of anusol along with GI follow up as needed.   Final Clinical Impression(s) / ED Diagnoses Final diagnoses:  Internal hemorrhoid  Rectal bleeding  Rx / DC Orders ED Discharge Orders         Ordered    hydrocortisone (ANUSOL-HC) 25 MG suppository  2 times daily     08/20/19 0204           Azusena Erlandson, Corene Cornea, MD 08/20/19 [phone]

## 2019-09-04 ENCOUNTER — Emergency Department (HOSPITAL_COMMUNITY)
Admission: EM | Admit: 2019-09-04 | Discharge: 2019-09-04 | Disposition: A | Discharge status: Home / Self Care | Payer: BC Managed Care – PPO | Attending: Emergency Medicine | Admitting: Emergency Medicine

## 2019-09-04 ENCOUNTER — Emergency Department (HOSPITAL_BASED_OUTPATIENT_CLINIC_OR_DEPARTMENT_OTHER)
Admission: EM | Admit: 2019-09-04 | Discharge: 2019-09-05 | Disposition: A | Discharge status: Home / Self Care | Payer: BC Managed Care – PPO | Attending: Emergency Medicine | Admitting: Emergency Medicine

## 2019-09-04 ENCOUNTER — Other Ambulatory Visit: Payer: Self-pay

## 2019-09-04 ENCOUNTER — Encounter (HOSPITAL_BASED_OUTPATIENT_CLINIC_OR_DEPARTMENT_OTHER): Payer: Self-pay | Admitting: *Deleted

## 2019-09-04 ENCOUNTER — Encounter (HOSPITAL_COMMUNITY): Payer: Self-pay

## 2019-09-04 DIAGNOSIS — G8929 Other chronic pain: Secondary | ICD-10-CM

## 2019-09-04 DIAGNOSIS — Z880 Allergy status to penicillin: Secondary | ICD-10-CM | POA: Insufficient documentation

## 2019-09-04 DIAGNOSIS — Z7984 Long term (current) use of oral hypoglycemic drugs: Secondary | ICD-10-CM | POA: Diagnosis not present

## 2019-09-04 DIAGNOSIS — R109 Unspecified abdominal pain: Secondary | ICD-10-CM | POA: Insufficient documentation

## 2019-09-04 DIAGNOSIS — Z5321 Procedure and treatment not carried out due to patient leaving prior to being seen by health care provider: Secondary | ICD-10-CM | POA: Insufficient documentation

## 2019-09-04 DIAGNOSIS — K625 Hemorrhage of anus and rectum: Secondary | ICD-10-CM | POA: Insufficient documentation

## 2019-09-04 MED ORDER — SODIUM CHLORIDE 0.9% FLUSH: 3.0000 mL | Freq: Once | INTRAVENOUS | Status: DC

## 2019-09-04 MED ORDER — SODIUM CHLORIDE 0.9% FLUSH
3.0000 mL | Freq: Once | INTRAVENOUS | Status: DC
  Filled 2019-09-04: qty 3

## 2019-09-04 NOTE — ED Triage Notes (Addendum)
Patient complains of abdominal pain with radiation to back. Complains of rectal bleeding with same. States that the abdominal pain x 2 weeks. denis nausea, denies vomiting Patient also reports CT scan 2 weeks ago and unsure of results. Has been unable to get GI appt

## 2019-09-04 NOTE — ED Notes (Signed)
Pt stated they wanted to leave and they were asked to stay but they wanted to leave.

## 2019-09-04 NOTE — ED Notes (Signed)
Called pt for lab work draw. No answer in the lobby

## 2019-09-04 NOTE — ED Notes (Signed)
Pt called for lab work draw. No answer in the lobby

## 2019-09-04 NOTE — ED Triage Notes (Addendum)
Abdominal pan with radiation into her back. Rectal bleeding x 2 weeks. Reports nausea, denies vomiting. Pt saw GI and had CT scan done last week. Requesting colonoscopy today.

## 2019-09-05 MED ORDER — DICYCLOMINE HCL 20 MG PO TABS
20.0000 mg | ORAL_TABLET | Freq: Four times a day (QID) | ORAL | 0 refills | Status: DC | PRN
Start: 2019-09-05 — End: 2020-08-27

## 2019-09-05 NOTE — ED Notes (Signed)
Patient refused discharge vitals and left before discharge paperwork including prescription was completed. Provider and charge nurse aware.

## 2019-09-05 NOTE — ED Provider Notes (Signed)
[facility] Provider Note: [name], MD, FACEP  CSN: [id number] MRN: [id number] ARRIVAL: 09/04/19 at 2042 ROOM: MHOTF/OTF   CHIEF COMPLAINT  Abdominal Pain   HISTORY OF PRESENT ILLNESS  09/05/19 12:28 AM Jenny Wright is a 54 y.o. female who has had chronic abdominal pain for about a year.  She describes the pain as being in diffuse locations in her abdomen sometimes even radiating around to her flanks bilaterally.  She is currently pointing to her epigastrium and bilateral lower quadrants.  She rates the pain is a 10 out of 10.  She states it is constant but it is also crampy.  She describes it as feeling like fireworks are going off in her abdomen.  It is currently worse when when moving and when standing upright.  It is improved when bending over.  For the past month she has had rectal bleeding which she describes as blood on the toilet tissue and in the toilet bowl.  Her stools are dark green which she attributes to taking iron supplementation.  The bleeding has subsided over the past 4 days.  She has been seen in the ED and had an unremarkable CT scan on 08/27/2019 (cholelithiasis and diverticulosis without evidence of active inflammation).  She has been seen by her gastroenterologist who did a rectal exam 4 days ago.  She states this made it feel like her entire bowel was stretched open and she has not recovered from the pain exacerbation that that caused.  She has had nausea but no vomiting or diarrhea.  The patient is here insisting that she needs a colonoscopy and that she has been unable to get anyone to provide her with a colonoscopy.  She would like [place] to perform a colonoscopy or send her to another facility where colonoscopy may be done tonight.  She is also insisting on something for pain because her pain is a 10 out of 10 and she has been dealing with it for a year.  The patient is wearing a surgical mask covered with an N95 mask covered with a face shield.   She would not let me touch her cell phone because "you see everything here".  Past Medical History:  Diagnosis Date  . Anemia   . Bell's palsy   . Factor V Leiden mutation complicating pregnancy ([place])   . Miscarriage   . Preeclampsia    pregnancy  . Scoliosis     Past Surgical History:  Procedure Laterality Date  . BACK SURGERY    . CESAREAN SECTION    . harrington rod  1980    Family History  Problem Relation Age of Onset  . Breast cancer Mother        deceased  . Heart disease Father        deceased  . Lung cancer Brother        deceased    Social History   Tobacco Use  . Smoking status: Never Smoker  . Smokeless tobacco: Never Used  Substance Use Topics  . Alcohol use: No  . Drug use: No    Prior to Admission medications   Medication Sig Start Date End Date Taking? Authorizing Provider  aspirin 81 MG chewable tablet Chew 81 mg by mouth daily.    [provider]  Cholecalciferol (VITAMIN D3) 1000 units CAPS Take by mouth.    [provider]  dicyclomine (BENTYL) 20 MG tablet Take 1 tablet (20 mg total) by mouth 4 (four) times  daily as needed (Abdominal cramping). 09/05/19   Aissa Lisowski, MD  ferrous gluconate (FERGON) 324 MG tablet Take 324 mg by mouth daily with breakfast.    [provider]  ferrous sulfate 325 (65 FE) MG tablet Take 325 mg by mouth daily with breakfast.    [provider]  LYSINE PO Take 1 tablet by mouth daily.    [provider]  Multiple Vitamin (MULTIVITAMIN) capsule Take 1 capsule by mouth daily.    [provider]  fluticasone (FLONASE) 50 MCG/ACT nasal spray Place 1 spray into both nostrils daily. 09/24/17 01/06/19  Frederica Kuster, PA-C  medroxyPROGESTERone (PROVERA) 10 MG tablet Take 1 tablet (10 mg total) by mouth daily. 07/14/13 02/01/19  Charlann Lange, PA-C  pantoprazole (PROTONIX) 40 MG tablet Take 1 tablet (40 mg total) by mouth daily. 03/05/17 01/06/19  Blanchie Dessert, MD     Allergies Penicillins   REVIEW OF SYSTEMS  Negative except as noted here or in the History of Present Illness.   PHYSICAL EXAMINATION  Initial Vital Signs Blood pressure 134/80, pulse 62, temperature 98.9 F (37.2 C), temperature source Oral, resp. rate 17, height 5' 6.5" (1.689 m), weight 80.7 kg, SpO2 100 %.  Examination General: Well-developed, well-nourished female in no acute distress; appearance consistent with age of record HENT: normocephalic; atraumatic Eyes: pupils equal, round and reactive to light; extraocular muscles intact Neck: supple Heart: regular rate and rhythm Lungs: clear to auscultation bilaterally Abdomen: soft; nondistended; nontender; no masses or hepatosplenomegaly; bowel sounds present Rectal: Patient refused any examination of her rectum Extremities: No deformity; full range of motion; pulses normal Neurologic: Awake, alert and oriented; motor function intact in all extremities and symmetric; no facial droop Skin: Warm and dry Psychiatric: Circumstantial speech   RESULTS  Summary of this visit's results, reviewed and interpreted by myself:   EKG Interpretation  Date/Time:    Ventricular Rate:    PR Interval:    QRS Duration:   QT Interval:    QTC Calculation:   R Axis:     Text Interpretation:        Laboratory Studies: No results found for this or any previous visit (from the past 24 hour(s)). Imaging Studies: No results found.  ED COURSE and MDM  Nursing notes, initial and subsequent vitals signs, including pulse oximetry, reviewed and interpreted by myself.  Vitals:   09/04/19 2049 09/04/19 2050 09/05/19 0019  BP: (!) 158/108  134/80  Pulse: 69  62  Resp: 14  17  Temp: 98.9 F (37.2 C)    TempSrc: Oral    SpO2: 97%  100%  Weight:  80.7 kg   Height:  5' 6.5" (1.689 m)    Medications  sodium chloride flush (NS) 0.9 % injection 3 mL ( Intravenous Canceled Entry 09/05/19 0002)   The patient's examination, which was  incomplete because of her refusal to permit a rectal examination, was unremarkable.  It is difficult to keep the patient focused because she keeps insisting that she needs a colonoscopy and believes the system is working against her in denying her what she believes is the obvious diagnostic study for her condition.  She is also upset that no one has given her anything for pain and she has been taking ibuprofen and Tylenol without relief.  The patient was advised that because her pain is chronic that narcotic pain medication could not be given.  She refused IM shots of Bentyl and Toradol but does not like needles.  She  was offered oral Bentyl and agreed to take a prescription for it, but not a dose of the ED, but left without taking her prescription with her.  She was advised to follow-up with her gastroenterologist regarding her desire to get a colonoscopy as it is reasonable given her symptoms that a colonoscopy be performed.  Again it was reiterated that we do not perform, nor can we order, emergent colonoscopies in the ED.  The only way to get an emergent colonoscopy would be if she was admitted as an inpatient for an emergent condition of which there is no evidence at this time.   PROCEDURES  Procedures   ED DIAGNOSES     ICD-10-CM   1. Chronic abdominal pain  R10.9    G89.29        Argyle Gustafson, Jenny Reichmann, MD 09/05/19 [phone]

## 2019-10-08 ENCOUNTER — Emergency Department (HOSPITAL_BASED_OUTPATIENT_CLINIC_OR_DEPARTMENT_OTHER): Payer: BC Managed Care – PPO

## 2019-10-08 ENCOUNTER — Emergency Department (HOSPITAL_BASED_OUTPATIENT_CLINIC_OR_DEPARTMENT_OTHER)
Admission: EM | Admit: 2019-10-08 | Discharge: 2019-10-09 | Disposition: A | Discharge status: Home / Self Care | Payer: BC Managed Care – PPO | Attending: Emergency Medicine | Admitting: Emergency Medicine

## 2019-10-08 ENCOUNTER — Encounter (HOSPITAL_BASED_OUTPATIENT_CLINIC_OR_DEPARTMENT_OTHER): Payer: Self-pay | Admitting: *Deleted

## 2019-10-08 ENCOUNTER — Other Ambulatory Visit: Payer: Self-pay

## 2019-10-08 DIAGNOSIS — R0789 Other chest pain: Secondary | ICD-10-CM | POA: Insufficient documentation

## 2019-10-08 DIAGNOSIS — R079 Chest pain, unspecified: Secondary | ICD-10-CM

## 2019-10-08 DIAGNOSIS — Z79899 Other long term (current) drug therapy: Secondary | ICD-10-CM | POA: Insufficient documentation

## 2019-10-08 DIAGNOSIS — J9811 Atelectasis: Secondary | ICD-10-CM | POA: Diagnosis not present

## 2019-10-08 LAB — CBC WITH DIFFERENTIAL/PLATELET
Abs Immature Granulocytes: 0.01 10*3/uL (ref 0.00–0.07)
Basophils Absolute: 0 10*3/uL (ref 0.0–0.1)
Basophils Relative: 1 %
Eosinophils Absolute: 0.1 10*3/uL (ref 0.0–0.5)
Eosinophils Relative: 1 %
HCT: 35.3 % — ABNORMAL LOW (ref 36.0–46.0)
Hemoglobin: 11.8 g/dL — ABNORMAL LOW (ref 12.0–15.0)
Immature Granulocytes: 0 %
Lymphocytes Relative: 41 %
Lymphs Abs: 2.2 10*3/uL (ref 0.7–4.0)
MCH: 29.6 pg (ref 26.0–34.0)
MCHC: 33.4 g/dL (ref 30.0–36.0)
MCV: 88.5 fL (ref 80.0–100.0)
Monocytes Absolute: 0.5 10*3/uL (ref 0.1–1.0)
Monocytes Relative: 9 %
Neutro Abs: 2.7 10*3/uL (ref 1.7–7.7)
Neutrophils Relative %: 48 %
Platelets: 199 10*3/uL (ref 150–400)
RBC: 3.99 MIL/uL (ref 3.87–5.11)
RDW: 12.5 % (ref 11.5–15.5)
WBC: 5.4 10*3/uL (ref 4.0–10.5)
nRBC: 0 % (ref 0.0–0.2)

## 2019-10-08 NOTE — ED Triage Notes (Signed)
Pt c/o chest " pressure" x 1 day , denies SOB or nausea , sent here from [facility] for eval

## 2019-10-09 LAB — COMPREHENSIVE METABOLIC PANEL
ALT: 13 U/L (ref 0–44)
AST: 16 U/L (ref 15–41)
Albumin: 4.1 g/dL (ref 3.5–5.0)
Alkaline Phosphatase: 68 U/L (ref 38–126)
Anion gap: 9 (ref 5–15)
BUN: 16 mg/dL (ref 6–20)
CO2: 26 mmol/L (ref 22–32)
Calcium: 9 mg/dL (ref 8.9–10.3)
Chloride: 104 mmol/L (ref 98–111)
Creatinine, Ser: 0.87 mg/dL (ref 0.44–1.00)
GFR calc Af Amer: >60 mL/min (ref >60)
GFR calc non Af Amer: >60 mL/min (ref >60)
Glucose, Bld: 131 mg/dL — ABNORMAL HIGH (ref 70–99)
Potassium: 3.5 mmol/L (ref 3.5–5.1)
Sodium: 139 mmol/L (ref 135–145)
Total Bilirubin: 0.3 mg/dL (ref 0.3–1.2)
Total Protein: 7.3 g/dL (ref 6.5–8.1)

## 2019-10-09 LAB — TROPONIN I (HIGH SENSITIVITY): Troponin I (High Sensitivity): 2 ng/L (ref <18)

## 2019-10-09 NOTE — ED Provider Notes (Signed)
[facility] EMERGENCY DEPARTMENT Provider Note   CSN: [id number] Arrival date & time: 10/08/19  2333     History Chief Complaint  Patient presents with  . Chest Pain    Jenny Wright is a 54 y.o. female.  Medical problems document below who sprayed some vinegar/water/soap mixture yesterday to clean her house and then today she burped and she thought that maybe some of that spray had gotten into her coronary arteries and cause her to have a heart attack so she presents here for further evaluation.  She states that she takes a very very large breath she has little bit of pressure in her lower chest cavity bilaterally.  She not short of breath but notices it when she takes deep breath.  No coughing or fevers.  No nausea, vomiting lightheadedness or diaphoresis.  No exertional component.  No lower extremity swelling.   Chest Pain      Past Medical History:  Diagnosis Date  . Anemia   . Bell's palsy   . Factor V Leiden mutation complicating pregnancy ([place])   . Miscarriage   . Preeclampsia    pregnancy  . Scoliosis     Patient Active Problem List   Diagnosis Date Noted  . Tinea pedis 08/29/2012  . Onychomycosis 08/29/2012  . Rash and nonspecific skin eruption 08/29/2012  . Bell's palsy 02/13/2012  . Anemia 02/13/2012  . Uterine fibroid 02/13/2012  . Scoliosis 02/13/2012  . Antiphospholipid antibody positive 02/13/2012    Past Surgical History:  Procedure Laterality Date  . BACK SURGERY    . CESAREAN SECTION    . harrington rod  1980     OB History   No obstetric history on file.     Family History  Problem Relation Age of Onset  . Breast cancer Mother        deceased  . Heart disease Father        deceased  . Lung cancer Brother        deceased    Social History   Tobacco Use  . Smoking status: Never Smoker  . Smokeless tobacco: Never Used  Substance Use Topics  . Alcohol use: No  . Drug use: No    Home Medications Prior to  Admission medications   Medication Sig Start Date End Date Taking? Authorizing Provider  aspirin 81 MG chewable tablet Chew 81 mg by mouth daily.    [provider]  Cholecalciferol (VITAMIN D3) 1000 units CAPS Take by mouth.    [provider]  dicyclomine (BENTYL) 20 MG tablet Take 1 tablet (20 mg total) by mouth 4 (four) times daily as needed (Abdominal cramping). 09/05/19   Molpus, John, MD  ferrous gluconate (FERGON) 324 MG tablet Take 324 mg by mouth daily with breakfast.    [provider]  ferrous sulfate 325 (65 FE) MG tablet Take 325 mg by mouth daily with breakfast.    [provider]  LYSINE PO Take 1 tablet by mouth daily.    [provider]  Multiple Vitamin (MULTIVITAMIN) capsule Take 1 capsule by mouth daily.    [provider]  fluticasone (FLONASE) 50 MCG/ACT nasal spray Place 1 spray into both nostrils daily. 09/24/17 01/06/19  Frederica Kuster, PA-C  medroxyPROGESTERone (PROVERA) 10 MG tablet Take 1 tablet (10 mg total) by mouth daily. 07/14/13 02/01/19  Charlann Lange, PA-C  pantoprazole (PROTONIX) 40 MG tablet Take 1 tablet (40 mg total) by mouth daily. 03/05/17 01/06/19  Plunkett,  Loree Fee, MD    Allergies    Penicillins  Review of Systems   Review of Systems  Cardiovascular: Positive for chest pain.  All other systems reviewed and are negative.   Physical Exam Updated Vital Signs BP (!) 154/83   Pulse 73   Temp 97.9 F (36.6 C) (Oral)   Resp 18   Ht 5' 6.5" (1.689 m)   Wt 78.5 kg   SpO2 100%   BMI 27.50 kg/m   Physical Exam Vitals and nursing note reviewed.  Constitutional:      Appearance: She is well-developed.  HENT:     Head: Normocephalic and atraumatic.     Mouth/Throat:     Mouth: Mucous membranes are moist.     Pharynx: Oropharynx is clear.  Eyes:     Pupils: Pupils are equal, round, and reactive to light.  Cardiovascular:     Rate and Rhythm: Normal rate and regular rhythm.  Pulmonary:       Effort: No respiratory distress.     Breath sounds: No stridor. No decreased breath sounds or wheezing.  Abdominal:     General: There is no distension.  Musculoskeletal:        General: Normal range of motion.     Cervical back: Normal range of motion.     Right lower leg: No edema.     Left lower leg: No edema.  Skin:    General: Skin is warm and dry.     Coloration: Skin is not cyanotic, jaundiced or pale.  Neurological:     General: No focal deficit present.     Mental Status: She is alert.     ED Results / Procedures / Treatments   Labs (all labs ordered are listed, but only abnormal results are displayed) Labs Reviewed  CBC WITH DIFFERENTIAL/PLATELET - Abnormal; Notable for the following components:      Result Value   Hemoglobin 11.8 (*)    HCT 35.3 (*)    All other components within normal limits  COMPREHENSIVE METABOLIC PANEL - Abnormal; Notable for the following components:   Glucose, Bld 131 (*)    All other components within normal limits  TROPONIN I (HIGH SENSITIVITY)    EKG None  Radiology DG Chest 2 View  Result Date: 10/08/2019 CLINICAL DATA:  Chest pain EXAM: CHEST - 2 VIEW COMPARISON:  09/24/2017 FINDINGS: Lungs are clear save for minimal left basilar atelectasis or scarring. No pneumothorax or pleural effusion. Cardiac size within normal limits. Pulmonary vascularity is normal. Harrington rod is partially visualized spanning the thoracolumbar spine. IMPRESSION: No active cardiopulmonary disease. Electronically Signed   By: Fidela Salisbury MD   On: 10/08/2019 23:59    Procedures Procedures (including critical care time)  Medications Ordered in ED Medications - No data to display  ED Course  I have reviewed the triage vital signs and the nursing notes.  Pertinent labs & imaging results that were available during my care of the patient were reviewed by me and considered in my medical decision making (see chart for details).    MDM  Rules/Calculators/A&P                          Patient very odd but pleasant behavior.  I discussed that her low suspicion for coronary artery disease based on a inhalation event however reassured her with normal labs normal EKG chest x-ray.  Patient is in no distress.  Patient stable for discharge.  Final Clinical Impression(s) / ED Diagnoses Final diagnoses:  Nonspecific chest pain    Rx / DC Orders ED Discharge Orders    None       Arva Slaugh, Corene Cornea, MD 10/09/19 [phone]

## 2020-01-05 DIAGNOSIS — Z131 Encounter for screening for diabetes mellitus: Secondary | ICD-10-CM | POA: Diagnosis not present

## 2020-01-05 DIAGNOSIS — R350 Frequency of micturition: Secondary | ICD-10-CM | POA: Diagnosis not present

## 2020-01-05 DIAGNOSIS — D689 Coagulation defect, unspecified: Secondary | ICD-10-CM | POA: Diagnosis not present

## 2020-01-05 DIAGNOSIS — Z13220 Encounter for screening for lipoid disorders: Secondary | ICD-10-CM | POA: Diagnosis not present

## 2020-01-05 DIAGNOSIS — Z0000 Encounter for general adult medical examination without abnormal findings: Secondary | ICD-10-CM | POA: Diagnosis not present

## 2020-01-30 ENCOUNTER — Other Ambulatory Visit: Payer: Self-pay | Admitting: Cardiology

## 2020-01-30 DIAGNOSIS — E782 Mixed hyperlipidemia: Secondary | ICD-10-CM | POA: Diagnosis not present

## 2020-01-30 DIAGNOSIS — Z1231 Encounter for screening mammogram for malignant neoplasm of breast: Secondary | ICD-10-CM

## 2020-01-30 DIAGNOSIS — R739 Hyperglycemia, unspecified: Secondary | ICD-10-CM | POA: Diagnosis not present

## 2020-01-30 DIAGNOSIS — Z9189 Other specified personal risk factors, not elsewhere classified: Secondary | ICD-10-CM | POA: Diagnosis not present

## 2020-01-31 ENCOUNTER — Other Ambulatory Visit: Payer: Self-pay | Admitting: Cardiology

## 2020-02-05 ENCOUNTER — Other Ambulatory Visit: Payer: Self-pay | Admitting: Cardiology

## 2020-02-05 DIAGNOSIS — N644 Mastodynia: Secondary | ICD-10-CM

## 2020-02-11 ENCOUNTER — Other Ambulatory Visit: Payer: Self-pay | Admitting: Cardiology

## 2020-02-11 DIAGNOSIS — N644 Mastodynia: Secondary | ICD-10-CM

## 2020-03-19 ENCOUNTER — Ambulatory Visit: Admission: RE | Admit: 2020-03-19 | Payer: BC Managed Care – PPO | Source: Ambulatory Visit

## 2020-03-19 ENCOUNTER — Other Ambulatory Visit: Payer: Self-pay

## 2020-03-19 ENCOUNTER — Ambulatory Visit
Admission: RE | Admit: 2020-03-19 | Discharge: 2020-03-19 | Disposition: A | Discharge status: Home / Self Care | Payer: BC Managed Care – PPO | Source: Ambulatory Visit | Attending: Cardiology | Admitting: Cardiology

## 2020-03-19 DIAGNOSIS — N644 Mastodynia: Secondary | ICD-10-CM

## 2020-04-25 DIAGNOSIS — E782 Mixed hyperlipidemia: Secondary | ICD-10-CM | POA: Diagnosis not present

## 2020-04-25 DIAGNOSIS — Z79899 Other long term (current) drug therapy: Secondary | ICD-10-CM | POA: Diagnosis not present

## 2020-05-05 ENCOUNTER — Other Ambulatory Visit: Payer: Self-pay

## 2020-05-05 ENCOUNTER — Emergency Department (HOSPITAL_BASED_OUTPATIENT_CLINIC_OR_DEPARTMENT_OTHER)
Admission: EM | Admit: 2020-05-05 | Discharge: 2020-05-06 | Disposition: A | Discharge status: Home / Self Care | Payer: BC Managed Care – PPO | Attending: Emergency Medicine | Admitting: Emergency Medicine

## 2020-05-05 DIAGNOSIS — Z7982 Long term (current) use of aspirin: Secondary | ICD-10-CM | POA: Diagnosis not present

## 2020-05-05 DIAGNOSIS — R072 Precordial pain: Secondary | ICD-10-CM | POA: Insufficient documentation

## 2020-05-05 DIAGNOSIS — R0602 Shortness of breath: Secondary | ICD-10-CM | POA: Insufficient documentation

## 2020-05-05 DIAGNOSIS — R079 Chest pain, unspecified: Secondary | ICD-10-CM | POA: Diagnosis not present

## 2020-05-05 NOTE — ED Triage Notes (Signed)
C/o chest pain and SOb after being exposed to lysol

## 2020-05-06 ENCOUNTER — Other Ambulatory Visit: Payer: Self-pay

## 2020-05-06 ENCOUNTER — Emergency Department (HOSPITAL_BASED_OUTPATIENT_CLINIC_OR_DEPARTMENT_OTHER): Payer: BC Managed Care – PPO

## 2020-05-06 ENCOUNTER — Encounter (HOSPITAL_BASED_OUTPATIENT_CLINIC_OR_DEPARTMENT_OTHER): Payer: Self-pay | Admitting: *Deleted

## 2020-05-06 DIAGNOSIS — R079 Chest pain, unspecified: Secondary | ICD-10-CM | POA: Diagnosis not present

## 2020-05-06 DIAGNOSIS — R0602 Shortness of breath: Secondary | ICD-10-CM | POA: Diagnosis not present

## 2020-05-06 NOTE — ED Provider Notes (Signed)
[facility] EMERGENCY DEPARTMENT Provider Note   CSN: [id number] Arrival date & time: 05/05/20  2354     History Chief Complaint  Patient presents with  . Chest Pain    Jenny Wright is a 55 y.o. female.  The history is provided by the patient.  Chest Pain Pain location:  R chest Pain quality: tightness   Pain radiates to:  Does not radiate Pain severity:  Moderate Onset quality:  Sudden Duration:  1 day Timing:  Constant Progression:  Unchanged Chronicity:  New Relieved by:  Nothing Worsened by:  Nothing Associated symptoms: shortness of breath   Associated symptoms: no abdominal pain, no cough, no fever and no vomiting   Patient presents with chest pain.  Patient reports yesterday her son sprayed a product similar to Lysol throughout the room.  She reports soon after this exposure she began having a headache and right-sided chest pain. She reports the chest pain is tight and mild shortness of breath.  No significant coughing.  No fevers or vomiting.  She reports since that time the headache is resolved, but the chest pain has not improved.      Past Medical History:  Diagnosis Date  . Anemia   . Bell's palsy   . Factor V Leiden mutation complicating pregnancy ([place])   . Miscarriage   . Preeclampsia    pregnancy  . Scoliosis     Patient Active Problem List   Diagnosis Date Noted  . Tinea pedis 08/29/2012  . Onychomycosis 08/29/2012  . Rash and nonspecific skin eruption 08/29/2012  . Bell's palsy 02/13/2012  . Anemia 02/13/2012  . Uterine fibroid 02/13/2012  . Scoliosis 02/13/2012  . Antiphospholipid antibody positive 02/13/2012    Past Surgical History:  Procedure Laterality Date  . BACK SURGERY    . CESAREAN SECTION    . harrington rod  1980     OB History   No obstetric history on file.     Family History  Problem Relation Age of Onset  . Breast cancer Mother        deceased  . Heart disease Father        deceased  . Lung  cancer Brother        deceased    Social History   Tobacco Use  . Smoking status: Never Smoker  . Smokeless tobacco: Never Used  Substance Use Topics  . Alcohol use: No  . Drug use: No    Home Medications Prior to Admission medications   Medication Sig Start Date End Date Taking? Authorizing Provider  aspirin 81 MG chewable tablet Chew 81 mg by mouth daily.    [provider]  Cholecalciferol (VITAMIN D3) 1000 units CAPS Take by mouth.    [provider]  dicyclomine (BENTYL) 20 MG tablet Take 1 tablet (20 mg total) by mouth 4 (four) times daily as needed (Abdominal cramping). 09/05/19   Molpus, John, MD  ferrous gluconate (FERGON) 324 MG tablet Take 324 mg by mouth daily with breakfast.    [provider]  ferrous sulfate 325 (65 FE) MG tablet Take 325 mg by mouth daily with breakfast.    [provider]  LYSINE PO Take 1 tablet by mouth daily. Patient not taking: Reported on 05/06/2020    [provider]  Multiple Vitamin (MULTIVITAMIN) capsule Take 1 capsule by mouth daily.    [provider]  fluticasone (FLONASE) 50 MCG/ACT nasal spray Place 1 spray into both nostrils daily. 09/24/17  01/06/19  Law, Bea Graff, PA-C  medroxyPROGESTERone (PROVERA) 10 MG tablet Take 1 tablet (10 mg total) by mouth daily. 07/14/13 02/01/19  Charlann Lange, PA-C  pantoprazole (PROTONIX) 40 MG tablet Take 1 tablet (40 mg total) by mouth daily. 03/05/17 01/06/19  Blanchie Dessert, MD    Allergies    Penicillins  Review of Systems   Review of Systems  Constitutional: Negative for fever.  Respiratory: Positive for shortness of breath. Negative for cough.   Cardiovascular: Positive for chest pain. Negative for leg swelling.  Gastrointestinal: Negative for abdominal pain and vomiting.  All other systems reviewed and are negative.   Physical Exam Updated Vital Signs BP (!) 157/85 (BP Location: Left Arm)   Pulse (!) 55   Temp 98.2 F (36.8 C)  (Oral)   Resp 13   Ht 1.702 m (5\' 7" )   Wt 77.1 kg   SpO2 99%   BMI 26.63 kg/m   Physical Exam CONSTITUTIONAL: Well developed/well nourished, reading on her phone no distress HEAD: Normocephalic/atraumatic EYES: EOMI/PERRL ENMT: Mucous membranes moist NECK: supple no meningeal signs SPINE/BACK:entire spine nontender CV: S1/S2 noted, no murmurs/rubs/gallops noted LUNGS: Lungs are clear to auscultation bilaterally, no apparent distress Chest-mild right-sided chest wall tenderness, no crepitus ABDOMEN: soft, nontender NEURO: Pt is awake/alert/appropriate, moves all extremitiesx4.  No facial droop.   EXTREMITIES: pulses normal/equal, full ROM, no lower extremity edema or tenderness SKIN: warm, color normal PSYCH: no abnormalities of mood noted, alert and oriented to situation  ED Results / Procedures / Treatments   Labs (all labs ordered are listed, but only abnormal results are displayed) Labs Reviewed - No data to display  EKG EKG Interpretation  Date/Time:  Wednesday May 06 2020 00:11:46 EST Ventricular Rate:  65 PR Interval:    QRS Duration: 64 QT Interval:  404 QTC Calculation: 420 R Axis:   71 Text Interpretation: Sinus rhythm Anterior infarct, old No significant change since last tracing Interpretation limited secondary to artifact Confirmed by Ripley Fraise [phone]) on 05/06/2020 12:18:27 AM   Radiology DG Chest 2 View  Result Date: 05/06/2020 CLINICAL DATA:  Chest pain and shortness of breath after being exposed to Lysol. EXAM: CHEST - 2 VIEW COMPARISON:  October 08, 2019 FINDINGS: The heart size and mediastinal contours are within normal limits. Both lungs are clear. A radiopaque Harrington rod is again seen overlying the lower thoracic spine and upper lumbar spine. IMPRESSION: No active cardiopulmonary disease. Electronically Signed   By: Virgina Norfolk M.D.   On: 05/06/2020 00:54    Procedures Procedures   Medications Ordered in ED Medications - No  data to display  ED Course  I have reviewed the triage vital signs and the nursing notes.  Pertinent  imaging results that were available during my care of the patient were reviewed by me and considered in my medical decision making (see chart for details).    MDM Rules/Calculators/A&P                          Patient reports persistent chest tightness for 24 hours since being exposed to a spray.  On repeat assessment she reports all of her symptoms are gone after she burped. Low suspicion for ACS/PE/dissection at this time.  No lung findings to suggest this was a toxic exposure   Final Clinical Impression(s) / ED Diagnoses Final diagnoses:  Precordial pain    Rx / DC Orders ED Discharge Orders    None  Ripley Fraise, MD 05/06/20 [phone]

## 2020-05-06 NOTE — Discharge Instructions (Addendum)

## 2020-05-08 DIAGNOSIS — R1030 Lower abdominal pain, unspecified: Secondary | ICD-10-CM | POA: Diagnosis not present

## 2020-05-08 DIAGNOSIS — Z113 Encounter for screening for infections with a predominantly sexual mode of transmission: Secondary | ICD-10-CM | POA: Diagnosis not present

## 2020-05-08 DIAGNOSIS — Z114 Encounter for screening for human immunodeficiency virus [HIV]: Secondary | ICD-10-CM | POA: Diagnosis not present

## 2020-05-11 DIAGNOSIS — R140 Abdominal distension (gaseous): Secondary | ICD-10-CM | POA: Diagnosis not present

## 2020-05-11 DIAGNOSIS — R1013 Epigastric pain: Secondary | ICD-10-CM | POA: Diagnosis not present

## 2020-05-23 ENCOUNTER — Other Ambulatory Visit: Payer: Self-pay

## 2020-05-23 ENCOUNTER — Inpatient Hospital Stay (HOSPITAL_COMMUNITY)
Admission: AD | Admit: 2020-05-23 | Discharge: 2020-05-23 | Discharge status: Against Medical Advice | Payer: BC Managed Care – PPO | Attending: Obstetrics and Gynecology | Admitting: Obstetrics and Gynecology

## 2020-05-23 DIAGNOSIS — Z5321 Procedure and treatment not carried out due to patient leaving prior to being seen by health care provider: Secondary | ICD-10-CM | POA: Insufficient documentation

## 2020-05-23 LAB — POCT PREGNANCY, URINE: Preg Test, Ur: NEGATIVE

## 2020-05-23 NOTE — MAU Note (Signed)
Jenny Wright is a 55 y.o. here in MAU reporting: for the past 6 months has been having abdominal pains that radiate to her and to her chest. States she was was given a progesterone prescription about a year ago and since then she has not felt right. States pains get better after using the bathroom, farting, or burping.   LMP: over a year  Onset of complaint: ongoing  Pain score: 8/10  Vitals:   05/23/20 1343  BP: 128/71  Pulse: 69  Resp: 16  Temp: 98.7 F (37.1 C)  SpO2: 100%     Lab orders placed from triage: upt

## 2020-05-23 NOTE — MAU Note (Signed)
Pt requesting to speak to house supervisor due to confusion regarding services offered at [facility]. Arby Barrette brought to triage to speak with patient. Pt given options for care and ultimately decided to not seek care today and will call the office for further evaluation and ambulated off the unit.

## 2020-06-08 DIAGNOSIS — R140 Abdominal distension (gaseous): Secondary | ICD-10-CM | POA: Diagnosis not present

## 2020-06-08 DIAGNOSIS — R1013 Epigastric pain: Secondary | ICD-10-CM | POA: Diagnosis not present

## 2020-06-16 DIAGNOSIS — R102 Pelvic and perineal pain: Secondary | ICD-10-CM | POA: Diagnosis not present

## 2020-06-21 DIAGNOSIS — R3989 Other symptoms and signs involving the genitourinary system: Secondary | ICD-10-CM | POA: Diagnosis not present

## 2020-06-21 DIAGNOSIS — R1013 Epigastric pain: Secondary | ICD-10-CM | POA: Diagnosis not present

## 2020-06-27 ENCOUNTER — Other Ambulatory Visit: Payer: Self-pay

## 2020-06-27 ENCOUNTER — Emergency Department (HOSPITAL_COMMUNITY)
Admission: EM | Admit: 2020-06-27 | Discharge: 2020-06-27 | Disposition: A | Discharge status: Home / Self Care | Payer: BC Managed Care – PPO | Attending: Emergency Medicine | Admitting: Emergency Medicine

## 2020-06-27 ENCOUNTER — Encounter (HOSPITAL_COMMUNITY): Payer: Self-pay

## 2020-06-27 DIAGNOSIS — R1010 Upper abdominal pain, unspecified: Secondary | ICD-10-CM | POA: Diagnosis not present

## 2020-06-27 DIAGNOSIS — R1084 Generalized abdominal pain: Secondary | ICD-10-CM | POA: Diagnosis not present

## 2020-06-27 DIAGNOSIS — K2950 Unspecified chronic gastritis without bleeding: Secondary | ICD-10-CM | POA: Diagnosis not present

## 2020-06-27 DIAGNOSIS — R100 Acute abdomen: Secondary | ICD-10-CM | POA: Diagnosis not present

## 2020-06-27 DIAGNOSIS — Z7982 Long term (current) use of aspirin: Secondary | ICD-10-CM | POA: Diagnosis not present

## 2020-06-27 DIAGNOSIS — Z79899 Other long term (current) drug therapy: Secondary | ICD-10-CM | POA: Diagnosis not present

## 2020-06-27 LAB — COMPREHENSIVE METABOLIC PANEL
ALT: 15 U/L (ref 0–44)
AST: 17 U/L (ref 15–41)
Albumin: 3.9 g/dL (ref 3.5–5.0)
Alkaline Phosphatase: 69 U/L (ref 38–126)
Anion gap: 8 (ref 5–15)
BUN: 18 mg/dL (ref 6–20)
CO2: 27 mmol/L (ref 22–32)
Calcium: 9.4 mg/dL (ref 8.9–10.3)
Chloride: 103 mmol/L (ref 98–111)
Creatinine, Ser: 0.84 mg/dL (ref 0.44–1.00)
GFR, Estimated: >60 mL/min (ref >60)
Glucose, Bld: 125 mg/dL — ABNORMAL HIGH (ref 70–99)
Potassium: 4.2 mmol/L (ref 3.5–5.1)
Sodium: 138 mmol/L (ref 135–145)
Total Bilirubin: 0.3 mg/dL (ref 0.3–1.2)
Total Protein: 6.9 g/dL (ref 6.5–8.1)

## 2020-06-27 LAB — I-STAT BETA HCG BLOOD, ED (MC, WL, AP ONLY): I-stat hCG, quantitative: <5 m[IU]/mL (ref <5)

## 2020-06-27 LAB — URINALYSIS, ROUTINE W REFLEX MICROSCOPIC
Bilirubin Urine: NEGATIVE
Glucose, UA: NEGATIVE mg/dL
Hgb urine dipstick: NEGATIVE
Ketones, ur: NEGATIVE mg/dL
Leukocytes,Ua: NEGATIVE
Nitrite: NEGATIVE
Protein, ur: NEGATIVE mg/dL
Specific Gravity, Urine: 1.009 (ref 1.005–1.030)
pH: 6 (ref 5.0–8.0)

## 2020-06-27 LAB — LIPASE, BLOOD: Lipase: 87 U/L — ABNORMAL HIGH (ref 11–51)

## 2020-06-27 LAB — CBC
HCT: 37.4 % (ref 36.0–46.0)
Hemoglobin: 12.4 g/dL (ref 12.0–15.0)
MCH: 30 pg (ref 26.0–34.0)
MCHC: 33.2 g/dL (ref 30.0–36.0)
MCV: 90.6 fL (ref 80.0–100.0)
Platelets: 234 10*3/uL (ref 150–400)
RBC: 4.13 MIL/uL (ref 3.87–5.11)
RDW: 12.3 % (ref 11.5–15.5)
WBC: 5 10*3/uL (ref 4.0–10.5)
nRBC: 0 % (ref 0.0–0.2)

## 2020-06-27 MED ORDER — PANTOPRAZOLE SODIUM 20 MG PO TBEC: 20.0000 mg | DELAYED_RELEASE_TABLET | Freq: Every day | ORAL | 1 refills | Status: DC

## 2020-06-27 MED ORDER — LIDOCAINE VISCOUS HCL 2 % MT SOLN
15.0000 mL | Freq: Once | OROMUCOSAL | Status: DC
  Filled 2020-06-27: qty 15

## 2020-06-27 MED ORDER — ALUM & MAG HYDROXIDE-SIMETH 200-200-20 MG/5ML PO SUSP
30.0000 mL | Freq: Once | ORAL | Status: DC
  Filled 2020-06-27: qty 30

## 2020-06-27 MED ORDER — PANTOPRAZOLE SODIUM 40 MG PO TBEC
40.0000 mg | DELAYED_RELEASE_TABLET | Freq: Once | ORAL | Status: AC
  Administered 2020-06-27: 40 mg via ORAL
  Filled 2020-06-27: qty 1

## 2020-06-27 MED ORDER — SUCRALFATE 1 G PO TABS: 1.0000 g | ORAL_TABLET | Freq: Three times a day (TID) | ORAL | 1 refills | Status: DC

## 2020-06-27 NOTE — ED Triage Notes (Signed)
Pt reports that she is having upper abdominal pain and it radiates to back, having loose stools the past week. Pain is described as burning. Pain is constant - started intermittently in May of 2020. Increased pain started a week ago. Denies nausea, vomiting, fever, urinary symptoms.

## 2020-06-28 NOTE — ED Provider Notes (Signed)
[name] Provider Note   CSN: [id number] Arrival date & time: 06/27/20  0351     History Chief Complaint  Patient presents with  . Abdominal Pain    Jenny Wright is a 55 y.o. female.  55 year old female with a history of Bell's palsy, factor V Leiden presents to the emergency department for evaluation of upper abdominal pain.  Pain radiates to her back.  She describes it as a burning sensation.  It is fairly constant and can sometimes be aggravated or alleviated by certain positions.  Has noted increased burping and belching.  Reports having similar pain intermittently for the past 2 years.  She was most recently put on Prilosec for symptoms without relief.  Has been followed by gastroenterology and her primary care doctor for these complaints.  States prior evaluations have included a normal colonoscopy.  On chart review, had reassuring CT scan in July 2021.  Came in tonight due to frustration with ongoing pain.  Denies nausea, vomiting, fever, dysuria, hematuria, melena, hematochezia.  The history is provided by the patient. No language interpreter was used.  Abdominal Pain      Past Medical History:  Diagnosis Date  . Anemia   . Bell's palsy   . Factor V Leiden mutation complicating pregnancy ([place])   . Miscarriage   . Preeclampsia    pregnancy  . Scoliosis     Patient Active Problem List   Diagnosis Date Noted  . Tinea pedis 08/29/2012  . Onychomycosis 08/29/2012  . Rash and nonspecific skin eruption 08/29/2012  . Bell's palsy 02/13/2012  . Anemia 02/13/2012  . Uterine fibroid 02/13/2012  . Scoliosis 02/13/2012  . Antiphospholipid antibody positive 02/13/2012    Past Surgical History:  Procedure Laterality Date  . BACK SURGERY    . CESAREAN SECTION    . harrington rod  1980     OB History   No obstetric history on file.     Family History  Problem Relation Age of Onset  . Breast cancer Mother         deceased  . Heart disease Father        deceased  . Lung cancer Brother        deceased    Social History   Tobacco Use  . Smoking status: Never Smoker  . Smokeless tobacco: Never Used  Substance Use Topics  . Alcohol use: No  . Drug use: No    Home Medications Prior to Admission medications   Medication Sig Start Date End Date Taking? Authorizing Provider  pantoprazole (PROTONIX) 20 MG tablet Take 1 tablet (20 mg total) by mouth daily. 06/27/20  Yes Antonietta Breach, PA-C  sucralfate (CARAFATE) 1 g tablet Take 1 tablet (1 g total) by mouth 4 (four) times daily -  with meals and at bedtime. 06/27/20  Yes Antonietta Breach, PA-C  aspirin 81 MG chewable tablet Chew 81 mg by mouth daily.    [provider]  Cholecalciferol (VITAMIN D3) 1000 units CAPS Take by mouth.    [provider]  dicyclomine (BENTYL) 20 MG tablet Take 1 tablet (20 mg total) by mouth 4 (four) times daily as needed (Abdominal cramping). 09/05/19   Molpus, John, MD  ferrous gluconate (FERGON) 324 MG tablet Take 324 mg by mouth daily with breakfast.    [provider]  ferrous sulfate 325 (65 FE) MG tablet Take 325 mg by mouth daily with breakfast.    [provider]  LYSINE PO Take 1 tablet by mouth daily. Patient not taking: Reported on 05/06/2020    [provider]  Multiple Vitamin (MULTIVITAMIN) capsule Take 1 capsule by mouth daily.    [provider]  fluticasone (FLONASE) 50 MCG/ACT nasal spray Place 1 spray into both nostrils daily. 09/24/17 01/06/19  Frederica Kuster, PA-C  medroxyPROGESTERone (PROVERA) 10 MG tablet Take 1 tablet (10 mg total) by mouth daily. 07/14/13 02/01/19  Charlann Lange, PA-C    Allergies    Penicillins  Review of Systems   Review of Systems  Gastrointestinal: Positive for abdominal pain.  Ten systems reviewed and are negative for acute change, except as noted in the HPI.    Physical Exam Updated Vital Signs BP 128/79   Pulse (!) 49    Temp 97.9 F (36.6 C) (Oral)   Resp 15   Ht 5\' 7"  (1.702 m)   Wt 76.2 kg   SpO2 99%   BMI 26.31 kg/m   Physical Exam Vitals and nursing note reviewed.  Constitutional:      General: She is not in acute distress.    Appearance: She is well-developed. She is not diaphoretic.     Comments: Nontoxic-appearing and in no acute distress  HENT:     Head: Normocephalic and atraumatic.  Eyes:     General: No scleral icterus.    Conjunctiva/sclera: Conjunctivae normal.  Cardiovascular:     Rate and Rhythm: Normal rate and regular rhythm.     Pulses: Normal pulses.  Pulmonary:     Effort: Pulmonary effort is normal. No respiratory distress.     Breath sounds: No stridor. No wheezing.  Abdominal:     General: There is no distension.     Palpations: Abdomen is soft. There is no mass.     Tenderness: There is no guarding.     Comments: Abdomen soft, nondistended.  No focal tenderness to palpation.  No palpable masses, peritoneal signs, guarding.  Musculoskeletal:        General: Normal range of motion.     Cervical back: Normal range of motion.  Skin:    General: Skin is warm and dry.     Coloration: Skin is not pale.     Findings: No erythema or rash.  Neurological:     Mental Status: She is alert and oriented to person, place, and time.     Coordination: Coordination normal.  Psychiatric:        Behavior: Behavior normal.     ED Results / Procedures / Treatments   Labs (all labs ordered are listed, but only abnormal results are displayed) Labs Reviewed  LIPASE, BLOOD - Abnormal; Notable for the following components:      Result Value   Lipase 87 (*)    All other components within normal limits  COMPREHENSIVE METABOLIC PANEL - Abnormal; Notable for the following components:   Glucose, Bld 125 (*)    All other components within normal limits  URINALYSIS, ROUTINE W REFLEX MICROSCOPIC - Abnormal; Notable for the following components:   Color, Urine STRAW (*)    All other  components within normal limits  CBC  I-STAT BETA HCG BLOOD, ED ([facility], [facility], [facility] ONLY)    EKG EKG Interpretation  Date/Time:  Saturday June 27 2020 04:24:00 EDT Ventricular Rate:  66 PR Interval:  126 QRS Duration: 72 QT Interval:  404 QTC Calculation: 423 R Axis:   78 Text Interpretation: Normal sinus rhythm Anterior infarct , old Abnormal ECG No  significant change since last tracing Confirmed by [place], Peter [id number]) on 06/27/2020 3:32:21 PM   Radiology No results found.  Procedures Procedures   Medications Ordered in ED Medications  pantoprazole (PROTONIX) EC tablet 40 mg (40 mg Oral Given 06/27/20 2263)    ED Course  I have reviewed the triage vital signs and the nursing notes.  Pertinent labs & imaging results that were available during my care of the patient were reviewed by me and considered in my medical decision making (see chart for details).    MDM Rules/Calculators/A&P                          55 year old female presents for evaluation of chronic abdominal pain which has been ongoing x2 years.  Actively followed by gastroenterology and primary care doctor.  Describes pain primarily in her upper abdomen that is burning in nature.  She has had some increased burping and belching with this.  Her symptoms sound most clinically consistent with ongoing gastritis/PUD.  Acute on chronic pancreatitis considered, but lipase is reassuring today and the patient has no focal epigastric tenderness.  Labs today without leukocytosis, electrolyte abnormalities.  Liver and kidney function preserved.  Do not feel further emergent work-up is indicated, but have continue to encourage follow-up with her gastroenterologist and primary care doctor.  Will start on Carafate and Protonix.  Declines GI cocktail in the ED prior to discharge.  Return precautions discussed and provided. Patient discharged in stable condition with no unaddressed concerns.   Final Clinical Impression(s) / ED  Diagnoses Final diagnoses:  Chronic gastritis, presence of bleeding unspecified, unspecified gastritis type    Rx / DC Orders ED Discharge Orders         Ordered    pantoprazole (PROTONIX) 20 MG tablet  Daily        06/27/20 0622    sucralfate (CARAFATE) 1 g tablet  3 times daily with meals & bedtime        06/27/20 0622           Antonietta Breach, PA-C 33/54/56 [id number]    Delora Fuel, MD 89/37/34 [phone]

## 2020-06-29 DIAGNOSIS — K219 Gastro-esophageal reflux disease without esophagitis: Secondary | ICD-10-CM

## 2020-06-29 DIAGNOSIS — J3489 Other specified disorders of nose and nasal sinuses: Secondary | ICD-10-CM | POA: Diagnosis not present

## 2020-06-29 HISTORY — DX: Gastro-esophageal reflux disease without esophagitis: K21.9

## 2020-06-30 DIAGNOSIS — Z01818 Encounter for other preprocedural examination: Secondary | ICD-10-CM | POA: Diagnosis not present

## 2020-06-30 DIAGNOSIS — K219 Gastro-esophageal reflux disease without esophagitis: Secondary | ICD-10-CM | POA: Diagnosis not present

## 2020-06-30 DIAGNOSIS — R1013 Epigastric pain: Secondary | ICD-10-CM | POA: Diagnosis not present

## 2020-07-01 DIAGNOSIS — K3189 Other diseases of stomach and duodenum: Secondary | ICD-10-CM | POA: Diagnosis not present

## 2020-07-01 DIAGNOSIS — R1013 Epigastric pain: Secondary | ICD-10-CM | POA: Diagnosis not present

## 2020-07-01 DIAGNOSIS — K2900 Acute gastritis without bleeding: Secondary | ICD-10-CM | POA: Diagnosis not present

## 2020-07-01 DIAGNOSIS — K219 Gastro-esophageal reflux disease without esophagitis: Secondary | ICD-10-CM | POA: Diagnosis not present

## 2020-07-12 DIAGNOSIS — R1084 Generalized abdominal pain: Secondary | ICD-10-CM | POA: Diagnosis not present

## 2020-07-12 DIAGNOSIS — E559 Vitamin D deficiency, unspecified: Secondary | ICD-10-CM | POA: Diagnosis not present

## 2020-07-12 DIAGNOSIS — Z6827 Body mass index (BMI) 27.0-27.9, adult: Secondary | ICD-10-CM | POA: Diagnosis not present

## 2020-07-12 DIAGNOSIS — N951 Menopausal and female climacteric states: Secondary | ICD-10-CM | POA: Diagnosis not present

## 2020-07-13 DIAGNOSIS — R1013 Epigastric pain: Secondary | ICD-10-CM | POA: Diagnosis not present

## 2020-07-15 DIAGNOSIS — H6123 Impacted cerumen, bilateral: Secondary | ICD-10-CM | POA: Diagnosis not present

## 2020-07-15 DIAGNOSIS — K219 Gastro-esophageal reflux disease without esophagitis: Secondary | ICD-10-CM | POA: Diagnosis not present

## 2020-07-21 DIAGNOSIS — R102 Pelvic and perineal pain: Secondary | ICD-10-CM | POA: Diagnosis not present

## 2020-07-23 DIAGNOSIS — N951 Menopausal and female climacteric states: Secondary | ICD-10-CM | POA: Diagnosis not present

## 2020-07-27 DIAGNOSIS — R509 Fever, unspecified: Secondary | ICD-10-CM | POA: Diagnosis not present

## 2020-07-27 DIAGNOSIS — U071 COVID-19: Secondary | ICD-10-CM | POA: Diagnosis not present

## 2020-07-28 ENCOUNTER — Encounter (HOSPITAL_BASED_OUTPATIENT_CLINIC_OR_DEPARTMENT_OTHER): Payer: Self-pay | Admitting: *Deleted

## 2020-07-28 ENCOUNTER — Other Ambulatory Visit: Payer: Self-pay

## 2020-07-28 ENCOUNTER — Emergency Department (HOSPITAL_BASED_OUTPATIENT_CLINIC_OR_DEPARTMENT_OTHER)
Admission: EM | Admit: 2020-07-28 | Discharge: 2020-07-28 | Disposition: A | Discharge status: Home / Self Care | Payer: BC Managed Care – PPO | Attending: Emergency Medicine | Admitting: Emergency Medicine

## 2020-07-28 DIAGNOSIS — R059 Cough, unspecified: Secondary | ICD-10-CM | POA: Diagnosis not present

## 2020-07-28 DIAGNOSIS — Z7982 Long term (current) use of aspirin: Secondary | ICD-10-CM | POA: Diagnosis not present

## 2020-07-28 DIAGNOSIS — U071 COVID-19: Secondary | ICD-10-CM

## 2020-07-28 MED ORDER — NIRMATRELVIR/RITONAVIR (PAXLOVID)TABLET: 3.0000 | ORAL_TABLET | Freq: Two times a day (BID) | ORAL | 0 refills | Status: AC

## 2020-07-28 MED ORDER — NIRMATRELVIR/RITONAVIR (PAXLOVID)TABLET
3.0000 | ORAL_TABLET | Freq: Two times a day (BID) | ORAL | 0 refills | Status: DC
  Filled 2020-07-28: qty 30, 5d supply, fill #0

## 2020-07-28 NOTE — Discharge Instructions (Addendum)
Watch for worsening of symptoms. Stop the steroids, and azithromycin. Take the new medications.

## 2020-07-28 NOTE — ED Triage Notes (Signed)
covid + dx  X 2 days ago , c/o allergic reaction to meds prescribed to here for Dx

## 2020-07-29 ENCOUNTER — Other Ambulatory Visit: Payer: Self-pay

## 2020-07-29 ENCOUNTER — Emergency Department (HOSPITAL_COMMUNITY)
Admission: EM | Admit: 2020-07-29 | Discharge: 2020-07-29 | Disposition: A | Discharge status: Home / Self Care | Payer: BC Managed Care – PPO | Attending: Emergency Medicine | Admitting: Emergency Medicine

## 2020-07-29 ENCOUNTER — Encounter (HOSPITAL_COMMUNITY): Payer: Self-pay

## 2020-07-29 ENCOUNTER — Other Ambulatory Visit (HOSPITAL_BASED_OUTPATIENT_CLINIC_OR_DEPARTMENT_OTHER): Payer: Self-pay

## 2020-07-29 DIAGNOSIS — R070 Pain in throat: Secondary | ICD-10-CM | POA: Diagnosis not present

## 2020-07-29 DIAGNOSIS — Z7982 Long term (current) use of aspirin: Secondary | ICD-10-CM | POA: Insufficient documentation

## 2020-07-29 DIAGNOSIS — T7840XA Allergy, unspecified, initial encounter: Secondary | ICD-10-CM | POA: Insufficient documentation

## 2020-07-29 DIAGNOSIS — U071 COVID-19: Secondary | ICD-10-CM | POA: Diagnosis not present

## 2020-07-29 NOTE — ED Provider Notes (Signed)
[name] Provider Note   CSN: [id number] Arrival date & time: 07/28/20  2212     History Chief Complaint  Patient presents with  . Covid Positive    Jenny Wright is a 55 y.o. female.  HPI   Patient presents with the feeling of swelling in the back of her throat.  Began today.  States that she is COVID-positive from 2 days ago.  Has had symptoms for around 3 days.  Had been seen by PCP and has been treated with "COVID cocktail.  States that it was azithromycin, methylprednisolone, and some vitamins.  States she took the azithromycin yesterday and then ended up taking the full day of steroids at once.  States that then felt some swelling in her throat.  No nausea or vomiting.  Is not feeling shortness of breath.  Able to swallow.  Has not worsened over the last few hours.  Patient is not vaccinated for COVID.  States her primary care doctor told her not to get it because she tends to have allergies.  However reviewing records does have some hypercoagulability which would be a risk factor both for the vaccines and for COVID. Past Medical History:  Diagnosis Date  . Anemia   . Bell's palsy   . Factor V Leiden mutation complicating pregnancy ([place])   . Miscarriage   . Preeclampsia    pregnancy  . Scoliosis     Patient Active Problem List   Diagnosis Date Noted  . Tinea pedis 08/29/2012  . Onychomycosis 08/29/2012  . Rash and nonspecific skin eruption 08/29/2012  . Bell's palsy 02/13/2012  . Anemia 02/13/2012  . Uterine fibroid 02/13/2012  . Scoliosis 02/13/2012  . Antiphospholipid antibody positive 02/13/2012    Past Surgical History:  Procedure Laterality Date  . BACK SURGERY    . CESAREAN SECTION    . harrington rod  1980     OB History   No obstetric history on file.     Family History  Problem Relation Age of Onset  . Breast cancer Mother        deceased  . Heart disease Father        deceased  . Lung cancer Brother         deceased    Social History   Tobacco Use  . Smoking status: Never Smoker  . Smokeless tobacco: Never Used  Substance Use Topics  . Alcohol use: No  . Drug use: No    Home Medications Prior to Admission medications   Medication Sig Start Date End Date Taking? Authorizing Provider  aspirin 81 MG chewable tablet Chew 81 mg by mouth daily.    [provider]  Cholecalciferol (VITAMIN D3) 1000 units CAPS Take by mouth.    [provider]  dicyclomine (BENTYL) 20 MG tablet Take 1 tablet (20 mg total) by mouth 4 (four) times daily as needed (Abdominal cramping). 09/05/19   Molpus, John, MD  ferrous gluconate (FERGON) 324 MG tablet Take 324 mg by mouth daily with breakfast.    [provider]  ferrous sulfate 325 (65 FE) MG tablet Take 325 mg by mouth daily with breakfast.    [provider]  LYSINE PO Take 1 tablet by mouth daily. Patient not taking: Reported on 05/06/2020    [provider]  Multiple Vitamin (MULTIVITAMIN) capsule Take 1 capsule by mouth daily.    [provider]  nirmatrelvir/ritonavir EUA (PAXLOVID) TABS Take 3 tablets by mouth 2 (  two) times daily for 5 days. Patient GFR is >60. Take nirmatrelvir (150 mg) 2 tablet(s) twice daily for 5 days and ritonavir (100 mg) one tablet twice daily for 5 days. 07/28/20 08/02/20  Davonna Belling, MD  pantoprazole (PROTONIX) 20 MG tablet Take 1 tablet (20 mg total) by mouth daily. 06/27/20   Antonietta Breach, PA-C  sucralfate (CARAFATE) 1 g tablet Take 1 tablet (1 g total) by mouth 4 (four) times daily -  with meals and at bedtime. 06/27/20   Antonietta Breach, PA-C  fluticasone (FLONASE) 50 MCG/ACT nasal spray Place 1 spray into both nostrils daily. 09/24/17 01/06/19  Frederica Kuster, PA-C  medroxyPROGESTERone (PROVERA) 10 MG tablet Take 1 tablet (10 mg total) by mouth daily. 07/14/13 02/01/19  Charlann Lange, PA-C    Allergies    Penicillins  Review of Systems   Review of Systems   Constitutional: Negative for appetite change.  HENT: Negative for trouble swallowing.   Respiratory: Positive for cough. Negative for shortness of breath.   Gastrointestinal: Negative for abdominal pain.  Genitourinary: Negative for flank pain.  Musculoskeletal: Positive for myalgias.  Skin: Negative for rash.  Neurological: Negative for weakness.  Psychiatric/Behavioral: Negative for confusion.    Physical Exam Updated Vital Signs BP 128/76 (BP Location: Right Arm)   Pulse (!) 59   Temp 98.8 F (37.1 C) (Oral)   Resp 18   Ht 5\' 7"  (1.702 m)   Wt 77.1 kg   SpO2 99%   BMI 26.63 kg/m   Physical Exam Vitals and nursing note reviewed.  HENT:     Head: Atraumatic.     Mouth/Throat:     Pharynx: No oropharyngeal exudate or posterior oropharyngeal erythema.  Eyes:     Pupils: Pupils are equal, round, and reactive to light.  Cardiovascular:     Rate and Rhythm: Normal rate.  Pulmonary:     Breath sounds: No wheezing, rhonchi or rales.  Abdominal:     Tenderness: There is no abdominal tenderness.  Musculoskeletal:        General: No tenderness.     Cervical back: Neck supple.  Skin:    General: Skin is warm.     Capillary Refill: Capillary refill takes less than 2 seconds.  Neurological:     Mental Status: She is alert and oriented to person, place, and time.     ED Results / Procedures / Treatments   Labs (all labs ordered are listed, but only abnormal results are displayed) Labs Reviewed - No data to display  EKG None  Radiology No results found.  Procedures Procedures   Medications Ordered in ED Medications - No data to display  ED Course  I have reviewed the triage vital signs and the nursing notes.  Pertinent labs & imaging results that were available during my care of the patient were reviewed by me and considered in my medical decision making (see chart for details).    MDM Rules/Calculators/A&P                          Patient presented  worried that she was having a reaction to the medicine she was given for COVID.  Positive COVID test 2 days ago.  Well-appearing.  No swelling seen.  Discussed with patient and will stop azithromycin and the steroids.  States she has had some problems with steroids in the past.  She is higher risk for complications of COVID being hypercoagulable.  Given prescription  for Paxil event.  Also given ambulatory referral to the COVID follow-up.  They can determine if she needs other treatment.  However appears stable for discharge at this time.  Does not appear to be as severe reaction at this time. Final Clinical Impression(s) / ED Diagnoses Final diagnoses:  [id number]    Rx / DC Orders ED Discharge Orders         Ordered    nirmatrelvir/ritonavir EUA (PAXLOVID) TABS  2 times daily,   Status:  Discontinued        07/28/20 2331    Ambulatory referral for Covid Treatment        07/28/20 2333    nirmatrelvir/ritonavir EUA (PAXLOVID) TABS  2 times daily        07/28/20 2341           Davonna Belling, MD 07/29/20 [phone]

## 2020-07-29 NOTE — ED Triage Notes (Signed)
Pt COVID+ dx on Monday. Pt reports she had an allergic reaction that was prescribed to her. Pt reports she would like to start antiviral medication soon.

## 2020-07-29 NOTE — Discharge Instructions (Signed)
Take over the counter guaifenesin, an expectorant, for your chest congestion, such as regular mucinex. Get help right away if: You develop symptoms of an allergic reaction. You may notice them soon after you are exposed to a substance. Symptoms may include: Flushed skin. Hives. Swelling of the eyes, lips, face, mouth, tongue, or throat. Difficulty breathing, speaking, or swallowing. Wheezing. Dizziness or light-headedness. Fainting. Pain or cramping in the abdomen. Vomiting. Diarrhea.

## 2020-07-29 NOTE — ED Provider Notes (Signed)
[facility] EMERGENCY DEPARTMENT Provider Note   CSN: [id number] Arrival date & time: 07/29/20  0547     History Chief Complaint  Patient presents with  . COVID +  . Allergic Reaction    Jenny Wright is a 55 y.o. female with a past medical history factor V Leiden, multiple drug allergies and sensitivities who presents emergency department after sensation of throat discomfort.  Patient began having COVID symptoms that started with a fever 4 days ago.  She was diagnosed with coronavirus on Monday and has had otherwise a very mild course that includes nasal and chest congestion.  Her PCP started her on a combination of vitamin D, vitamin C, zinc, prednisone, and azithromycin.  Patient states that she has had severe allergic reaction to prednisone in the past that included diffuse body swelling.  She took 3 doses of her medication and overnight had a sensation of tightening of the back of her throat so presented to be emergency department last night at 10 PM.  Was discharged after being written a prescription for antiviral medication and told to discontinue that medicine.  Patient states that when she woke this morning she felt like she had to burp but could not it was concerned she is having an allergic reaction again so came in.  She states that she has been able to belch she has no symptoms at this time she denies wheezing, throat swelling, hives.  Denies chest pain or shortness of breath  HPI     Past Medical History:  Diagnosis Date  . Anemia   . Bell's palsy   . Factor V Leiden mutation complicating pregnancy ([place])   . Miscarriage   . Preeclampsia    pregnancy  . Scoliosis     Patient Active Problem List   Diagnosis Date Noted  . Tinea pedis 08/29/2012  . Onychomycosis 08/29/2012  . Rash and nonspecific skin eruption 08/29/2012  . Bell's palsy 02/13/2012  . Anemia 02/13/2012  . Uterine fibroid 02/13/2012  . Scoliosis 02/13/2012  . Antiphospholipid  antibody positive 02/13/2012    Past Surgical History:  Procedure Laterality Date  . BACK SURGERY    . CESAREAN SECTION    . harrington rod  1980     OB History   No obstetric history on file.     Family History  Problem Relation Age of Onset  . Breast cancer Mother        deceased  . Heart disease Father        deceased  . Lung cancer Brother        deceased    Social History   Tobacco Use  . Smoking status: Never Smoker  . Smokeless tobacco: Never Used  Substance Use Topics  . Alcohol use: No  . Drug use: No    Home Medications Prior to Admission medications   Medication Sig Start Date End Date Taking? Authorizing Provider  aspirin 81 MG chewable tablet Chew 81 mg by mouth daily.    [provider]  Cholecalciferol (VITAMIN D3) 1000 units CAPS Take by mouth.    [provider]  dicyclomine (BENTYL) 20 MG tablet Take 1 tablet (20 mg total) by mouth 4 (four) times daily as needed (Abdominal cramping). 09/05/19   Molpus, John, MD  ferrous gluconate (FERGON) 324 MG tablet Take 324 mg by mouth daily with breakfast.    [provider]  ferrous sulfate 325 (65 FE) MG tablet Take 325 mg by mouth daily  with breakfast.    [provider]  LYSINE PO Take 1 tablet by mouth daily. Patient not taking: Reported on 05/06/2020    [provider]  Multiple Vitamin (MULTIVITAMIN) capsule Take 1 capsule by mouth daily.    [provider]  nirmatrelvir/ritonavir EUA (PAXLOVID) TABS Take 3 tablets by mouth 2 (two) times daily for 5 days. Patient GFR is >60. Take nirmatrelvir (150 mg) 2 tablet(s) twice daily for 5 days and ritonavir (100 mg) one tablet twice daily for 5 days. 07/28/20 08/02/20  Davonna Belling, MD  pantoprazole (PROTONIX) 20 MG tablet Take 1 tablet (20 mg total) by mouth daily. 06/27/20   Antonietta Breach, PA-C  sucralfate (CARAFATE) 1 g tablet Take 1 tablet (1 g total) by mouth 4 (four) times daily -  with meals and at  bedtime. 06/27/20   Antonietta Breach, PA-C  fluticasone (FLONASE) 50 MCG/ACT nasal spray Place 1 spray into both nostrils daily. 09/24/17 01/06/19  Frederica Kuster, PA-C  medroxyPROGESTERone (PROVERA) 10 MG tablet Take 1 tablet (10 mg total) by mouth daily. 07/14/13 02/01/19  Charlann Lange, PA-C    Allergies    Penicillins  Review of Systems   Review of Systems Ten systems reviewed and are negative for acute change, except as noted in the HPI.   Physical Exam Updated Vital Signs BP (!) 165/100 (BP Location: Right Arm)   Pulse 93   Temp 98.8 F (37.1 C) (Oral)   Resp 16   SpO2 95%   Physical Exam Vitals and nursing note reviewed.  Constitutional:      General: She is not in acute distress.    Appearance: She is well-developed. She is not diaphoretic.  HENT:     Head: Normocephalic and atraumatic.  Eyes:     General: No scleral icterus.    Conjunctiva/sclera: Conjunctivae normal.  Cardiovascular:     Rate and Rhythm: Normal rate and regular rhythm.     Heart sounds: Normal heart sounds. No murmur heard. No friction rub. No gallop.   Pulmonary:     Effort: Pulmonary effort is normal. No respiratory distress.     Breath sounds: Normal breath sounds.  Abdominal:     General: Bowel sounds are normal. There is no distension.     Palpations: Abdomen is soft. There is no mass.     Tenderness: There is no abdominal tenderness. There is no guarding.  Musculoskeletal:     Cervical back: Normal range of motion.  Skin:    General: Skin is warm and dry.  Neurological:     Mental Status: She is alert and oriented to person, place, and time.  Psychiatric:        Behavior: Behavior normal.     ED Results / Procedures / Treatments   Labs (all labs ordered are listed, but only abnormal results are displayed) Labs Reviewed - No data to display  EKG None  Radiology No results found.  Procedures Procedures   Medications Ordered in ED Medications - No data to display  ED Course   I have reviewed the triage vital signs and the nursing notes.  Pertinent labs & imaging results that were available during my care of the patient were reviewed by me and considered in my medical decision making (see chart for details).    MDM Rules/Calculators/A&P                         Patient here without any evidence of  allergic reaction.  I had a long discussion with the patient about options for treatment.  She has a very mild course of her disease and is improving and is almost 5 days out.  I discussed current CDC guidelines.  I discussed that she has the option not to take any medication at all and that ultimately antiviral medications are to help prevent hospitalization to reduce the severity of symptoms.  Given the fact that she has very mild symptoms she does not have to take anything.  Patient feels like that is currently what she wants to do but will discuss antivirals with her PCP today.  She has a current existing prescription for it if she wishes to pick it up.  Discussed over-the-counter guaifenesin for expectoration of chest congestion with increased fluid intake.  Patient does not have any evidence of current allergic reaction she denies vomiting or syncope and appears otherwise appropriate for discharge with close outpatient follow-up at this time. Final Clinical Impression(s) / ED Diagnoses Final diagnoses:  None    Rx / DC Orders ED Discharge Orders    None       Margarita Mail, PA-C 07/29/20 0745    Isla Pence, MD 07/29/20 [phone]

## 2020-07-30 ENCOUNTER — Telehealth (HOSPITAL_BASED_OUTPATIENT_CLINIC_OR_DEPARTMENT_OTHER): Payer: Self-pay | Admitting: Emergency Medicine

## 2020-07-30 ENCOUNTER — Telehealth: Payer: Self-pay | Admitting: Unknown Physician Specialty

## 2020-07-30 ENCOUNTER — Other Ambulatory Visit: Payer: Self-pay | Admitting: Unknown Physician Specialty

## 2020-07-30 DIAGNOSIS — R760 Raised antibody titer: Secondary | ICD-10-CM

## 2020-07-30 DIAGNOSIS — U071 COVID-19: Secondary | ICD-10-CM

## 2020-07-30 NOTE — Telephone Encounter (Signed)
Informed the patient that the infusion clinic usually calls patients back that qualify for infusion.  The patient was insistent that the ED doctor told her that she was high risk and would receive treatment.   Attempted to reach out to infusion clinic with no success.  Called [facility] and inquired about the situation, she related that she would investigate.

## 2020-07-30 NOTE — Telephone Encounter (Signed)
I connected by phone with Jenny Wright on 07/30/2020 at 11:15 AM to discuss the potential use of a new treatment for mild to moderate COVID-19 viral infection in non-hospitalized patients.  This patient is a 56 y.o. female that meets the FDA criteria for Emergency Use Authorization of COVID monoclonal antibody bebtelovimab.  Has a (+) direct SARS-CoV-2 viral test result  Has mild or moderate COVID-19   Is NOT hospitalized due to COVID-19  Is within 10 days of symptom onset  Has at least one of the high risk factor(s) for progression to severe COVID-19 and/or hospitalization as defined in EUA. Specific high risk criteria : Factor V Leiden, ethnicity  I have spoken and communicated the following to the patient or parent/caregiver regarding COVID monoclonal antibody treatment:  1. FDA has authorized the emergency use for the treatment of mild to moderate COVID-19 in adults and pediatric patients with positive results of direct SARS-CoV-2 viral testing who are 40 years of age and older weighing at least 40 kg, and who are at high risk for progressing to severe COVID-19 and/or hospitalization.  2. The significant known and potential risks and benefits of COVID monoclonal antibody, and the extent to which such potential risks and benefits are unknown.  3. Information on available alternative treatments and the risks and benefits of those alternatives, including clinical trials.  4. Patients treated with COVID monoclonal antibody should continue to self-isolate and use infection control measures (e.g., wear mask, isolate, social distance, avoid sharing personal items, clean and disinfect "high touch" surfaces, and frequent handwashing) according to CDC guidelines.   5. The patient or parent/caregiver has the option to accept or refuse COVID monoclonal antibody treatment.  6. Discussion about the monoclonal antibody infusion does not ensure treatment. The patient will be placed on a list and  scheduled according to risk, symptom onset and availability. A scheduler will reach to the patient to let them know if we can accommodate their infusion or not.  After reviewing this information with the patient, the patient has agreed to receive one of the available covid 19 monoclonal antibodies and will be provided an appropriate fact sheet prior to infusion. [name], NP 07/30/2020 11:15 AM  Pt given Paxlovid and worried about side effects and drug/drug interactions.

## 2020-07-31 ENCOUNTER — Ambulatory Visit: Payer: BC Managed Care – PPO

## 2020-08-05 ENCOUNTER — Other Ambulatory Visit: Payer: Self-pay

## 2020-08-05 ENCOUNTER — Encounter (HOSPITAL_BASED_OUTPATIENT_CLINIC_OR_DEPARTMENT_OTHER): Payer: Self-pay | Admitting: Emergency Medicine

## 2020-08-05 ENCOUNTER — Telehealth: Payer: Self-pay | Admitting: Adult Health

## 2020-08-05 ENCOUNTER — Emergency Department (HOSPITAL_BASED_OUTPATIENT_CLINIC_OR_DEPARTMENT_OTHER)
Admission: EM | Admit: 2020-08-05 | Discharge: 2020-08-05 | Disposition: A | Discharge status: Home / Self Care | Payer: BC Managed Care – PPO | Attending: Emergency Medicine | Admitting: Emergency Medicine

## 2020-08-05 ENCOUNTER — Emergency Department (HOSPITAL_COMMUNITY): Admission: EM | Admit: 2020-08-05 | Payer: BC Managed Care – PPO | Source: Home / Self Care

## 2020-08-05 DIAGNOSIS — U071 COVID-19: Secondary | ICD-10-CM | POA: Insufficient documentation

## 2020-08-05 DIAGNOSIS — R0602 Shortness of breath: Secondary | ICD-10-CM | POA: Diagnosis not present

## 2020-08-05 NOTE — ED Triage Notes (Signed)
Pt states feels like difficulty breathing states feel like an obstruction.  and symptoms not getting better.

## 2020-08-05 NOTE — Discharge Instructions (Addendum)
You were evaluated in the Emergency Department and after careful evaluation, we did not find any emergent condition requiring admission or further testing in the hospital.  Your exam/testing today was overall reassuring.  Symptoms seem well explained by COVID-19.  Recommend over-the-counter Mucinex for chest congestion.  Recommend over-the-counter Sudafed sinus congestion for your nasal symptoms.  Recommend plenty of fluids at home.  Please return to the Emergency Department if you experience any worsening of your condition.  Thank you for allowing [place] to be a part of your care.

## 2020-08-05 NOTE — ED Provider Notes (Signed)
[facility] Emergency Department Provider Note MRN:  [id number]  Arrival date & time: 08/05/20     Chief Complaint   Chest congestion History of Present Illness   Jenny Wright is a 55 y.o. year-old female with no pertinent past medical history presenting to the ED with chief complaint of chest congestion.  Patient is here for continued chest congestion.  Has tested positive for COVID-19.  Has had symptoms for 9 days.  Symptoms include swelling of the nasal passages, congestion of the chest.  Denies any significant trouble breathing or chest pain, no fever.  Has tried over-the-counter medications but wants something else for the symptoms.  No leg pain or swelling, no other complaints.  Symptoms mild, constant.  Review of Systems  A complete 10 system review of systems was obtained and all systems are negative except as noted in the HPI and PMH.   Patient's Health History    Past Medical History:  Diagnosis Date  . Anemia   . Bell's palsy   . Factor V Leiden mutation complicating pregnancy ([place])   . Miscarriage   . Preeclampsia    pregnancy  . Scoliosis     Past Surgical History:  Procedure Laterality Date  . BACK SURGERY    . CESAREAN SECTION    . harrington rod  1980    Family History  Problem Relation Age of Onset  . Breast cancer Mother        deceased  . Heart disease Father        deceased  . Lung cancer Brother        deceased    Social History   Socioeconomic History  . Marital status: Divorced    Spouse name: Not on file  . Number of children: Not on file  . Years of education: Not on file  . Highest education level: Not on file  Occupational History  . Not on file  Tobacco Use  . Smoking status: Never Smoker  . Smokeless tobacco: Never Used  Substance and Sexual Activity  . Alcohol use: No  . Drug use: No  . Sexual activity: Not Currently    Birth control/protection: None  Other Topics Concern  . Not on file   Social History Narrative  . Not on file   Social Determinants of Health   Financial Resource Strain: Not on file  Food Insecurity: Not on file  Transportation Needs: Not on file  Physical Activity: Not on file  Stress: Not on file  Social Connections: Not on file  Intimate Partner Violence: Not on file     Physical Exam   Vitals:   08/05/20 0247  BP: (!) 150/77  Pulse: 70  Resp: 18  Temp: 98.8 F (37.1 C)  SpO2: 98%    CONSTITUTIONAL: Well-appearing, NAD NEURO:  Alert and oriented x 3, no focal deficits EYES:  eyes equal and reactive ENT/NECK:  no LAD, no JVD CARDIO: Regular rate, well-perfused, normal S1 and S2 PULM:  CTAB no wheezing or rhonchi GI/GU:  normal bowel sounds, non-distended, non-tender MSK/SPINE:  No gross deformities, no edema SKIN:  no rash, atraumatic PSYCH:  Appropriate speech and behavior  *Additional and/or pertinent findings included in MDM below  Diagnostic and Interventional Summary    EKG Interpretation  Date/Time:    Ventricular Rate:    PR Interval:    QRS Duration:   QT Interval:    QTC Calculation:   R Axis:     Text  Interpretation:        Labs Reviewed - No data to display  No orders to display    Medications - No data to display   Procedures  /  Critical Care Procedures  ED Course and Medical Decision Making  I have reviewed the triage vital signs, the nursing notes, and pertinent available records from the EMR.  Listed above are laboratory and imaging tests that I personally ordered, reviewed, and interpreted and then considered in my medical decision making (see below for details).  Patient is in no acute distress, no increased work of breathing, lungs are clear.  Vital signs are normal, oxygen saturation 99% on room air.  Overall highly doubt emergent process.  Symptoms seem well explained by COVID-19.  Patient does have history of factor V Leiden and/or positive antiphospholipid antibody in her EMR, but even so,  highly doubt PE.  No evidence of DVT on exam, no hypoxia, no tachycardia, she is sitting comfortably.  This is mostly a visit for reassurance and she wants something else to help with the chest congestion.  We had a long discussion, she will return home with strict return precautions.       Barth Kirks. Sedonia Small, [place] [email or web address]  Final Clinical Impressions(s) / ED Diagnoses     ICD-10-CM   1. COVID  U07.1     ED Discharge Orders    None       Discharge Instructions Discussed with and Provided to Patient:     Discharge Instructions     You were evaluated in the Emergency Department and after careful evaluation, we did not find any emergent condition requiring admission or further testing in the hospital.  Your exam/testing today was overall reassuring.  Symptoms seem well explained by COVID-19.  Recommend over-the-counter Mucinex for chest congestion.  Recommend over-the-counter Sudafed sinus congestion for your nasal symptoms.  Recommend plenty of fluids at home.  Please return to the Emergency Department if you experience any worsening of your condition.  Thank you for allowing [place] to be a part of your care.       Maudie Flakes, MD 08/05/20 0330

## 2020-08-05 NOTE — Telephone Encounter (Signed)
Unable to reach patient about COVID positivity.  Reviewed chart, and noted ER visit and patient has had sx for 9 days and therefore is outside of the window to receive benefit from [place] therapy.  Wilber Bihari, NP

## 2020-08-08 DIAGNOSIS — U071 COVID-19: Secondary | ICD-10-CM | POA: Diagnosis not present

## 2020-08-08 DIAGNOSIS — R0602 Shortness of breath: Secondary | ICD-10-CM | POA: Diagnosis not present

## 2020-08-08 DIAGNOSIS — R030 Elevated blood-pressure reading, without diagnosis of hypertension: Secondary | ICD-10-CM | POA: Diagnosis not present

## 2020-08-08 DIAGNOSIS — Z6826 Body mass index (BMI) 26.0-26.9, adult: Secondary | ICD-10-CM | POA: Diagnosis not present

## 2020-08-10 DIAGNOSIS — Z20822 Contact with and (suspected) exposure to covid-19: Secondary | ICD-10-CM | POA: Diagnosis not present

## 2020-08-12 DIAGNOSIS — Z9189 Other specified personal risk factors, not elsewhere classified: Secondary | ICD-10-CM | POA: Diagnosis not present

## 2020-08-12 DIAGNOSIS — U071 COVID-19: Secondary | ICD-10-CM | POA: Diagnosis not present

## 2020-08-12 DIAGNOSIS — R1084 Generalized abdominal pain: Secondary | ICD-10-CM | POA: Diagnosis not present

## 2020-08-17 DIAGNOSIS — J3489 Other specified disorders of nose and nasal sinuses: Secondary | ICD-10-CM | POA: Diagnosis not present

## 2020-08-17 DIAGNOSIS — R0981 Nasal congestion: Secondary | ICD-10-CM | POA: Diagnosis not present

## 2020-08-17 DIAGNOSIS — Z9189 Other specified personal risk factors, not elsewhere classified: Secondary | ICD-10-CM | POA: Diagnosis not present

## 2020-08-17 DIAGNOSIS — U071 COVID-19: Secondary | ICD-10-CM | POA: Diagnosis not present

## 2020-08-23 ENCOUNTER — Encounter (HOSPITAL_BASED_OUTPATIENT_CLINIC_OR_DEPARTMENT_OTHER): Payer: Self-pay | Admitting: *Deleted

## 2020-08-23 ENCOUNTER — Emergency Department (HOSPITAL_BASED_OUTPATIENT_CLINIC_OR_DEPARTMENT_OTHER): Payer: BC Managed Care – PPO

## 2020-08-23 ENCOUNTER — Other Ambulatory Visit: Payer: Self-pay

## 2020-08-23 ENCOUNTER — Emergency Department (HOSPITAL_BASED_OUTPATIENT_CLINIC_OR_DEPARTMENT_OTHER)
Admission: EM | Admit: 2020-08-23 | Discharge: 2020-08-23 | Disposition: A | Discharge status: Home / Self Care | Payer: BC Managed Care – PPO | Attending: Emergency Medicine | Admitting: Emergency Medicine

## 2020-08-23 DIAGNOSIS — R072 Precordial pain: Secondary | ICD-10-CM | POA: Insufficient documentation

## 2020-08-23 DIAGNOSIS — Z8616 Personal history of COVID-19: Secondary | ICD-10-CM | POA: Insufficient documentation

## 2020-08-23 DIAGNOSIS — R0789 Other chest pain: Secondary | ICD-10-CM | POA: Diagnosis not present

## 2020-08-23 DIAGNOSIS — Z7982 Long term (current) use of aspirin: Secondary | ICD-10-CM | POA: Insufficient documentation

## 2020-08-23 DIAGNOSIS — R079 Chest pain, unspecified: Secondary | ICD-10-CM | POA: Diagnosis not present

## 2020-08-23 DIAGNOSIS — J029 Acute pharyngitis, unspecified: Secondary | ICD-10-CM | POA: Diagnosis not present

## 2020-08-23 DIAGNOSIS — U099 Post covid-19 condition, unspecified: Secondary | ICD-10-CM | POA: Diagnosis not present

## 2020-08-23 LAB — D-DIMER, QUANTITATIVE: D-Dimer, Quant: <0.27 ug/mL-FEU (ref 0.00–0.50)

## 2020-08-23 LAB — CBC WITH DIFFERENTIAL/PLATELET
Abs Immature Granulocytes: 0.05 10*3/uL (ref 0.00–0.07)
Basophils Absolute: 0 10*3/uL (ref 0.0–0.1)
Basophils Relative: 0 %
Eosinophils Absolute: 0 10*3/uL (ref 0.0–0.5)
Eosinophils Relative: 0 %
HCT: 38.2 % (ref 36.0–46.0)
Hemoglobin: 12.8 g/dL (ref 12.0–15.0)
Immature Granulocytes: 1 %
Lymphocytes Relative: 7 %
Lymphs Abs: 0.5 10*3/uL — ABNORMAL LOW (ref 0.7–4.0)
MCH: 29.2 pg (ref 26.0–34.0)
MCHC: 33.5 g/dL (ref 30.0–36.0)
MCV: 87.2 fL (ref 80.0–100.0)
Monocytes Absolute: 0 10*3/uL — ABNORMAL LOW (ref 0.1–1.0)
Monocytes Relative: 1 %
Neutro Abs: 6 10*3/uL (ref 1.7–7.7)
Neutrophils Relative %: 91 %
Platelets: 216 10*3/uL (ref 150–400)
RBC: 4.38 MIL/uL (ref 3.87–5.11)
RDW: 12.7 % (ref 11.5–15.5)
WBC: 6.5 10*3/uL (ref 4.0–10.5)
nRBC: 0 % (ref 0.0–0.2)

## 2020-08-23 LAB — COMPREHENSIVE METABOLIC PANEL
ALT: 19 U/L (ref 0–44)
AST: 24 U/L (ref 15–41)
Albumin: 4.3 g/dL (ref 3.5–5.0)
Alkaline Phosphatase: 77 U/L (ref 38–126)
Anion gap: 9 (ref 5–15)
BUN: 11 mg/dL (ref 6–20)
CO2: 23 mmol/L (ref 22–32)
Calcium: 9.8 mg/dL (ref 8.9–10.3)
Chloride: 106 mmol/L (ref 98–111)
Creatinine, Ser: 0.8 mg/dL (ref 0.44–1.00)
GFR, Estimated: >60 mL/min (ref >60)
Glucose, Bld: 190 mg/dL — ABNORMAL HIGH (ref 70–99)
Potassium: 3.6 mmol/L (ref 3.5–5.1)
Sodium: 138 mmol/L (ref 135–145)
Total Bilirubin: 0.2 mg/dL — ABNORMAL LOW (ref 0.3–1.2)
Total Protein: 7.7 g/dL (ref 6.5–8.1)

## 2020-08-23 LAB — LIPASE, BLOOD: Lipase: 31 U/L (ref 11–51)

## 2020-08-23 LAB — TROPONIN I (HIGH SENSITIVITY): Troponin I (High Sensitivity): 2 ng/L (ref <18)

## 2020-08-23 MED ORDER — DEXAMETHASONE SODIUM PHOSPHATE 10 MG/ML IJ SOLN
10.0000 mg | Freq: Once | INTRAMUSCULAR | Status: DC
  Filled 2020-08-23: qty 1

## 2020-08-23 MED ORDER — SODIUM CHLORIDE 0.9 % IV BOLUS
1000.0000 mL | Freq: Once | INTRAVENOUS | Status: AC
  Administered 2020-08-23: 1000 mL via INTRAVENOUS

## 2020-08-23 NOTE — ED Notes (Signed)
EDP at bedside  

## 2020-08-23 NOTE — ED Provider Notes (Signed)
[facility] Provider Note   CSN: [id number] Arrival date & time: 08/23/20  2028     History Chief Complaint  Patient presents with  . Chest Pain    Jenny Wright is a 55 y.o. female.  The history is provided by the patient.  Chest Pain Pain location:  Substernal area Pain quality: aching   Pain radiates to:  Does not radiate Pain severity:  Mild Onset quality:  Gradual Timing:  Intermittent Progression:  Waxing and waning Chronicity:  New Context: at rest   Relieved by:  Nothing Worsened by:  Nothing Associated symptoms: no abdominal pain, no back pain, no claudication, no cough, no fever, no headache, no heartburn, no lower extremity edema, no nausea, no palpitations, no shortness of breath, no syncope and no vomiting   Risk factors comment:  Covid several weeks ago, questional factor 5 leiden      Past Medical History:  Diagnosis Date  . Anemia   . Bell's palsy   . Factor V Leiden mutation complicating pregnancy ([place])   . Miscarriage   . Preeclampsia    pregnancy  . Scoliosis     Patient Active Problem List   Diagnosis Date Noted  . Tinea pedis 08/29/2012  . Onychomycosis 08/29/2012  . Rash and nonspecific skin eruption 08/29/2012  . Bell's palsy 02/13/2012  . Anemia 02/13/2012  . Uterine fibroid 02/13/2012  . Scoliosis 02/13/2012  . Antiphospholipid antibody positive 02/13/2012    Past Surgical History:  Procedure Laterality Date  . BACK SURGERY    . CESAREAN SECTION    . harrington rod  1980     OB History   No obstetric history on file.     Family History  Problem Relation Age of Onset  . Breast cancer Mother        deceased  . Heart disease Father        deceased  . Lung cancer Brother        deceased    Social History   Tobacco Use  . Smoking status: Never Smoker  . Smokeless tobacco: Never Used  Substance Use Topics  . Alcohol use: No  . Drug use: No    Home Medications Prior to  Admission medications   Medication Sig Start Date End Date Taking? Authorizing Provider  aspirin 81 MG chewable tablet Chew 81 mg by mouth daily.    [provider]  Cholecalciferol (VITAMIN D3) 1000 units CAPS Take by mouth.    [provider]  dicyclomine (BENTYL) 20 MG tablet Take 1 tablet (20 mg total) by mouth 4 (four) times daily as needed (Abdominal cramping). 09/05/19   Molpus, John, MD  ferrous gluconate (FERGON) 324 MG tablet Take 324 mg by mouth daily with breakfast.    [provider]  ferrous sulfate 325 (65 FE) MG tablet Take 325 mg by mouth daily with breakfast.    [provider]  LYSINE PO Take 1 tablet by mouth daily. Patient not taking: Reported on 05/06/2020    [provider]  Multiple Vitamin (MULTIVITAMIN) capsule Take 1 capsule by mouth daily.    [provider]  pantoprazole (PROTONIX) 20 MG tablet Take 1 tablet (20 mg total) by mouth daily. 06/27/20   Antonietta Breach, PA-C  sucralfate (CARAFATE) 1 g tablet Take 1 tablet (1 g total) by mouth 4 (four) times daily -  with meals and at bedtime. 06/27/20   Antonietta Breach, PA-C  fluticasone (FLONASE) 50 MCG/ACT nasal  spray Place 1 spray into both nostrils daily. 09/24/17 01/06/19  Frederica Kuster, PA-C  medroxyPROGESTERone (PROVERA) 10 MG tablet Take 1 tablet (10 mg total) by mouth daily. 07/14/13 02/01/19  Charlann Lange, PA-C    Allergies    Penicillins  Review of Systems   Review of Systems  Constitutional: Negative for chills and fever.  HENT: Negative for ear pain and sore throat.   Eyes: Negative for pain and visual disturbance.  Respiratory: Negative for cough and shortness of breath.   Cardiovascular: Positive for chest pain. Negative for palpitations, claudication and syncope.  Gastrointestinal: Negative for abdominal pain, heartburn, nausea and vomiting.  Genitourinary: Negative for dysuria and hematuria.  Musculoskeletal: Negative for arthralgias and back pain.   Skin: Negative for color change and rash.  Neurological: Negative for seizures, syncope and headaches.  All other systems reviewed and are negative.   Physical Exam Updated Vital Signs BP (!) 146/107   Pulse 80   Temp 99.1 F (37.3 C) (Oral)   Resp 19   Ht 5\' 7"  (1.702 m)   Wt 77 kg   SpO2 100%   BMI 26.59 kg/m   Physical Exam Vitals and nursing note reviewed.  Constitutional:      General: She is not in acute distress.    Appearance: She is well-developed. She is not ill-appearing.  HENT:     Head: Normocephalic and atraumatic.  Eyes:     Extraocular Movements: Extraocular movements intact.     Conjunctiva/sclera: Conjunctivae normal.     Pupils: Pupils are equal, round, and reactive to light.  Cardiovascular:     Rate and Rhythm: Normal rate and regular rhythm.     Pulses:          Radial pulses are 2+ on the right side and 2+ on the left side.     Heart sounds: Normal heart sounds. No murmur heard.   Pulmonary:     Effort: Pulmonary effort is normal. No respiratory distress.     Breath sounds: Normal breath sounds. No decreased breath sounds, wheezing or rhonchi.  Abdominal:     Palpations: Abdomen is soft.     Tenderness: There is no abdominal tenderness.  Musculoskeletal:        General: Normal range of motion.     Cervical back: Normal range of motion and neck supple.     Right lower leg: No edema.     Left lower leg: No edema.  Skin:    General: Skin is warm and dry.     Capillary Refill: Capillary refill takes less than 2 seconds.  Neurological:     General: No focal deficit present.     Mental Status: She is alert and oriented to person, place, and time.     Cranial Nerves: No cranial nerve deficit.     Motor: No weakness.  Psychiatric:        Mood and Affect: Mood normal.     ED Results / Procedures / Treatments   Labs (all labs ordered are listed, but only abnormal results are displayed) Labs Reviewed  CBC WITH DIFFERENTIAL/PLATELET -  Abnormal; Notable for the following components:      Result Value   Lymphs Abs 0.5 (*)    Monocytes Absolute 0.0 (*)    All other components within normal limits  COMPREHENSIVE METABOLIC PANEL - Abnormal; Notable for the following components:   Glucose, Bld 190 (*)    Total Bilirubin 0.2 (*)    All other  components within normal limits  D-DIMER, QUANTITATIVE  LIPASE, BLOOD  TROPONIN I (HIGH SENSITIVITY)    EKG EKG Interpretation  Date/Time:  Sunday August 23 2020 20:50:12 EDT Ventricular Rate:  106 PR Interval:  148 QRS Duration: 77 QT Interval:  332 QTC Calculation: 441 R Axis:   48 Text Interpretation: Sinus tachycardia Confirmed by Lennice Sites ([id number]) on 08/23/2020 8:56:17 PM   Radiology DG Chest Portable 1 View  Result Date: 08/23/2020 CLINICAL DATA:  Chest pressure. EXAM: PORTABLE CHEST 1 VIEW COMPARISON:  May 06, 2020 FINDINGS: The heart size and mediastinal contours are within normal limits. Both lungs are clear. A radiopaque Harrington rod is again seen overlying the lower thoracic spine and upper lumbar spine. No acute osseous abnormalities are identified. IMPRESSION: Stable exam without acute cardiopulmonary disease. Electronically Signed   By: Virgina Norfolk M.D.   On: 08/23/2020 21:19    Procedures Procedures   Medications Ordered in ED Medications  sodium chloride 0.9 % bolus 1,000 mL (1,000 mLs Intravenous New Bag/Given 08/23/20 2116)    ED Course  I have reviewed the triage vital signs and the nursing notes.  Pertinent labs & imaging results that were available during my care of the patient were reviewed by me and considered in my medical decision making (see chart for details).    MDM Rules/Calculators/A&P                          Jenny Wright is here with chest pain after testing positive for COVID several weeks ago.  Normal vitals.  No fever.  D-dimer normal doubt PE.  Troponin normal and unremarkable EKG doubt ACS.  Chest x-ray showed no  evidence of pneumonia.  No significant anemia, no electrolyte ab issue, no kidney injury.  Was already given a dose of Decadron for sore throat today by primary care doctor, no signs of throat infection on exam.  Overall suspect resolving viral process.  Patient appears well and discharged in ED in good condition.  This chart was dictated using voice recognition software.  Despite best efforts to proofread,  errors can occur which can change the documentation meaning.   Final Clinical Impression(s) / ED Diagnoses Final diagnoses:  Nonspecific chest pain    Rx / DC Orders ED Discharge Orders    None       Lennice Sites, DO 08/23/20 2218

## 2020-08-23 NOTE — ED Triage Notes (Signed)
Generalized chest pressure and "blockage" feeling in throat x 2 days. States she has had episodes of sweating as well

## 2020-08-26 DIAGNOSIS — U099 Post covid-19 condition, unspecified: Secondary | ICD-10-CM | POA: Diagnosis not present

## 2020-08-26 DIAGNOSIS — I10 Essential (primary) hypertension: Secondary | ICD-10-CM | POA: Diagnosis not present

## 2020-08-26 DIAGNOSIS — R0600 Dyspnea, unspecified: Secondary | ICD-10-CM | POA: Diagnosis not present

## 2020-08-26 DIAGNOSIS — R739 Hyperglycemia, unspecified: Secondary | ICD-10-CM | POA: Diagnosis not present

## 2020-08-27 ENCOUNTER — Emergency Department (HOSPITAL_BASED_OUTPATIENT_CLINIC_OR_DEPARTMENT_OTHER)
Admission: EM | Admit: 2020-08-27 | Discharge: 2020-08-27 | Disposition: A | Discharge status: Home / Self Care | Payer: BC Managed Care – PPO | Attending: Emergency Medicine | Admitting: Emergency Medicine

## 2020-08-27 ENCOUNTER — Other Ambulatory Visit: Payer: Self-pay

## 2020-08-27 DIAGNOSIS — Z7982 Long term (current) use of aspirin: Secondary | ICD-10-CM | POA: Diagnosis not present

## 2020-08-27 DIAGNOSIS — Z8616 Personal history of COVID-19: Secondary | ICD-10-CM | POA: Insufficient documentation

## 2020-08-27 DIAGNOSIS — R0602 Shortness of breath: Secondary | ICD-10-CM | POA: Diagnosis not present

## 2020-08-27 DIAGNOSIS — R059 Cough, unspecified: Secondary | ICD-10-CM | POA: Diagnosis not present

## 2020-08-27 MED ORDER — ALBUTEROL SULFATE HFA 108 (90 BASE) MCG/ACT IN AERS
2.0000 | INHALATION_SPRAY | RESPIRATORY_TRACT | Status: DC | PRN
  Administered 2020-08-27: 2 via RESPIRATORY_TRACT
  Filled 2020-08-27: qty 6.7

## 2020-08-27 NOTE — ED Notes (Signed)
ED Provider at bedside. 

## 2020-08-27 NOTE — ED Notes (Signed)
TTS cart in room and TTS on line to eval patient

## 2020-08-27 NOTE — ED Provider Notes (Signed)
[facility] Provider Note: [name], MD, FACEP  CSN: [id number] MRN: [id number] ARRIVAL: 08/27/20 at [place]: [id number]   CHIEF COMPLAINT  Shortness of Breath   HISTORY OF PRESENT ILLNESS  08/27/20 3:10 AM Jenny Wright is a 55 y.o. female was seen in the ED on 08/23/2020 for nonspecific chest pain.  Her work-up was unremarkable but she was told her blood pressure was elevated and she should have that followed up by her PCP.  She saw her cardiologist yesterday who told her she needed to be on a blood pressure medication but either did not prescribe 1 or it is not yet available at her pharmacy.  She is here with a sensation of wheezing when she breathes through her nose that occurred about 30 minutes prior to arrival.  That wheezing sensation is gone but she still feels like she cannot take a good deep breath.  She states she has had a persistent cough since having COVID last month.    Past Medical History:  Diagnosis Date   Anemia    Bell's palsy    Factor V Leiden mutation complicating pregnancy ([place])    Miscarriage    Preeclampsia    pregnancy   Scoliosis     Past Surgical History:  Procedure Laterality Date   BACK SURGERY     CESAREAN SECTION     harrington rod  1980    Family History  Problem Relation Age of Onset   Breast cancer Mother        deceased   Heart disease Father        deceased   Lung cancer Brother        deceased    Social History   Tobacco Use   Smoking status: Never   Smokeless tobacco: Never  Substance Use Topics   Alcohol use: No   Drug use: No    Prior to Admission medications   Medication Sig Start Date End Date Taking? Authorizing Provider  aspirin 81 MG chewable tablet Chew 81 mg by mouth daily.   Yes [provider]  Cholecalciferol (VITAMIN D3) 1000 units CAPS Take by mouth.   Yes [provider]  hydroxychloroquine (PLAQUENIL) 200 MG tablet Take 200 mg by mouth 2 (two) times daily.  08/10/20   [provider]  Multiple Vitamin (MULTI-VITAMIN) tablet Take by mouth.    [provider]  zinc gluconate 50 MG tablet Take 50 mg by mouth daily. 07/27/20   [provider]  fluticasone (FLONASE) 50 MCG/ACT nasal spray Place 1 spray into both nostrils daily. 09/24/17 01/06/19  Frederica Kuster, PA-C  medroxyPROGESTERone (PROVERA) 10 MG tablet Take 1 tablet (10 mg total) by mouth daily. 07/14/13 02/01/19  Charlann Lange, PA-C    Allergies Penicillins   REVIEW OF SYSTEMS  Negative except as noted here or in the History of Present Illness.   PHYSICAL EXAMINATION  Initial Vital Signs Blood pressure (!) 154/89, pulse 64, temperature 98.6 F (37 C), temperature source Oral, resp. rate 14, height 5\' 7"  (1.702 m), weight 77 kg, SpO2 100 %.  Examination General: Well-developed, well-nourished female in no acute distress; appearance consistent with age of record HENT: normocephalic; atraumatic; mild nasal congestion Eyes: pupils equal, round and reactive to light; extraocular muscles intact Neck: supple Heart: regular rate and rhythm Lungs: clear to auscultation bilaterally Abdomen: soft; nondistended; nontender; owel sounds present Extremities: No deformity; full range of motion; pulses normal Neurologic: Awake, alert and oriented; motor function  intact in all extremities and symmetric; no facial droop Skin: Warm and dry Psychiatric: Normal mood and affect   RESULTS  Summary of this visit's results, reviewed and interpreted by myself:   EKG Interpretation  Date/Time:  Thursday August 27 2020 03:00:15 EDT Ventricular Rate:  60 PR Interval:  168 QRS Duration: 78 QT Interval:  397 QTC Calculation: 397 R Axis:   60 Text Interpretation: Sinus rhythm Rate is slower Confirmed by Alandra Sando [phone]) on 08/27/2020 3:10:15 AM        Laboratory Studies: No results found for this or any previous visit (from the past 24 hour(s)). Imaging Studies: No  results found.  ED COURSE and MDM  Nursing notes, initial and subsequent vitals signs, including pulse oximetry, reviewed and interpreted by myself.  Vitals:   08/27/20 0254 08/27/20 0300 08/27/20 0303 08/27/20 0334  BP:  (!) 154/89    Pulse:  64    Resp:  14    Temp:   98.6 F (37 C)   TempSrc:   Oral   SpO2:  100%  (!) 1%  Weight: 77 kg     Height: 5\' 7"  (1.702 m)      Medications  albuterol (VENTOLIN HFA) 108 (90 Base) MCG/ACT inhaler 2 puff (2 puffs Inhalation Given 08/27/20 0320)   We will provide the patient an albuterol inhaler and instruct her in its use.  She states she has never been diagnosed with asthma and is never used an inhaler.  Her symptoms may be sequela of her COVID infection from last month.   PROCEDURES  Procedures   ED DIAGNOSES     ICD-10-CM   1. Shortness of breath  R06.02          Shanon Rosser, MD 08/27/20 [phone]

## 2020-08-27 NOTE — ED Triage Notes (Signed)
Pt c/o right arm discomfort. Pt states earlier she had chest discomfort and felt like she was gasping for breath.

## 2020-08-28 ENCOUNTER — Ambulatory Visit: Payer: Self-pay

## 2020-08-28 NOTE — Telephone Encounter (Signed)
Reason for Disposition  [1] Caller has URGENT medicine question about med that PCP or specialist prescribed AND [2] triager unable to answer question  Answer Assessment - Initial Assessment Questions 1. NAME of MEDICATION: "What medicine are you calling about?"     Albuterol inhaler 2. QUESTION: "What is your question?" (e.g., double dose of medicine, side effect)    How do I use it 3. PRESCRIBING HCP: "Who prescribed it?" Reason: if prescribed by specialist, call should be referred to that group.     ED doctor 4. SYMPTOMS: "Do you have any symptoms?"     Cough 5. SEVERITY: If symptoms are present, ask "Are they mild, moderate or severe?"     Moderate 6. PREGNANCY:  "Is there any chance that you are pregnant?" "When was your last menstrual period?"     No  Protocols used: Medication Question Call-A-AH

## 2020-08-28 NOTE — Telephone Encounter (Signed)
Pt. Reports she is recovering from [place] 19. Seen in ED 08/27/20 with shortness of breath. Given Albuterol inhaler. Reviewed instructions with pt. As requested. Also given COVID 19/Respiratory Clinic number as requested.

## 2020-08-31 ENCOUNTER — Ambulatory Visit (INDEPENDENT_AMBULATORY_CARE_PROVIDER_SITE_OTHER): Payer: BC Managed Care – PPO | Admitting: Nurse Practitioner

## 2020-08-31 DIAGNOSIS — R0981 Nasal congestion: Secondary | ICD-10-CM

## 2020-08-31 DIAGNOSIS — Z8616 Personal history of COVID-19: Secondary | ICD-10-CM

## 2020-08-31 MED ORDER — ZITHROMAX Z-PAK 250 MG PO TABS: ORAL_TABLET | ORAL | 0 refills | Status: DC

## 2020-08-31 MED ORDER — MONTELUKAST SODIUM 10 MG PO TABS: 10.0000 mg | ORAL_TABLET | Freq: Every day | ORAL | 3 refills | Status: DC

## 2020-08-31 NOTE — Patient Instructions (Addendum)
History Covid 19 Sinus Congestion:   Stay well hydrated  Stay active  Deep breathing exercises  May take tylenol for fever or pain  May take mucinex twice daily  Will order azithromycin  Will order Singulair - at bedtime   Loss of taste and smell:  May try online olfactory retraining - essential oils   Insomnia:  Melatonin 1 mg - 1 hour before bedtime  Magnesium glycinate - 400 mg 1 hour before bedtime     Follow up:  Follow up in 4 weeks or sooner if needed

## 2020-08-31 NOTE — Progress Notes (Signed)
@Patient  ID: Jenny Wright, female    DOB: 05-24-1965, 55 y.o.   MRN: [id number]  Chief Complaint  Patient presents with   Hospitalization Follow-up    Referring provider: Secundino Ginger, PA-C   HPI  Patient presents today for post-COVID care clinic visit.  Patient tested positive for COVID in May 2022.  She has been to the ED 3 times for the same issue.  She has been prescribed doxycycline and prednisone.  She states that she did have allergic reaction to the prednisone.  She was also prescribed azithromycin but did not take this.  She has also contacted an online provider at some point who prescribed ivermectin and hydroxychloroquine.  Patient states that she did not take these medications.  We discussed that we do not recommend these 2 medications.  Patient states that her taste and smell has not fully returned.  She is still having sinus congestion pressure and pain and chest congestion.  Her recent x-ray was clear. Denies f/c/s, n/v/d, hemoptysis, PND, chest pain or edema.      Allergies  Allergen Reactions   Penicillins      There is no immunization history on file for this patient.  Past Medical History:  Diagnosis Date   Anemia    Bell's palsy    Factor V Leiden mutation complicating pregnancy ([place])    Miscarriage    Preeclampsia    pregnancy   Scoliosis     Tobacco History: Social History   Tobacco Use  Smoking Status Never  Smokeless Tobacco Never   Counseling given: Yes   Outpatient Encounter Medications as of 08/31/2020  Medication Sig   montelukast (SINGULAIR) 10 MG tablet Take 1 tablet (10 mg total) by mouth at bedtime.   ZITHROMAX Z-PAK 250 MG tablet Take 2 tablets (500 mg) on day 1, then take 1 tablet (250 mg) on days 2-5   aspirin 81 MG chewable tablet Chew 81 mg by mouth daily.   Cholecalciferol (VITAMIN D3) 1000 units CAPS Take by mouth.   hydroxychloroquine (PLAQUENIL) 200 MG tablet Take 200 mg by mouth 2 (two) times daily.   Multiple  Vitamin (MULTI-VITAMIN) tablet Take by mouth.   zinc gluconate 50 MG tablet Take 50 mg by mouth daily.   [DISCONTINUED] fluticasone (FLONASE) 50 MCG/ACT nasal spray Place 1 spray into both nostrils daily.   [DISCONTINUED] medroxyPROGESTERone (PROVERA) 10 MG tablet Take 1 tablet (10 mg total) by mouth daily.   No facility-administered encounter medications on file as of 08/31/2020.     Review of Systems  Review of Systems  Constitutional: Negative.  Negative for fatigue and fever.  HENT:  Positive for congestion, sinus pressure and sinus pain.   Respiratory:  Positive for cough. Negative for shortness of breath and wheezing.   Cardiovascular: Negative.   Gastrointestinal: Negative.   Allergic/Immunologic: Negative.   Neurological: Negative.   Psychiatric/Behavioral: Negative.        Physical Exam  BP (P) 136/62   Pulse (P) 64   Temp (P) 98.2 F (36.8 C)   Resp (P) 18   SpO2 (P) 100%   Wt Readings from Last 5 Encounters:  08/27/20 169 lb 12.1 oz (77 kg)  08/23/20 169 lb 12.1 oz (77 kg)  08/05/20 169 lb 12.1 oz (77 kg)  07/28/20 170 lb (77.1 kg)  06/27/20 168 lb (76.2 kg)     Physical Exam Vitals and nursing note reviewed.  Constitutional:      General: She is not in acute distress.  Appearance: She is well-developed.  HENT:     Nose: Congestion present.  Cardiovascular:     Rate and Rhythm: Normal rate and regular rhythm.  Pulmonary:     Effort: Pulmonary effort is normal.     Breath sounds: Normal breath sounds.  Neurological:     Mental Status: She is alert and oriented to person, place, and time.      Imaging: DG Chest Portable 1 View  Result Date: 08/23/2020 CLINICAL DATA:  Chest pressure. EXAM: PORTABLE CHEST 1 VIEW COMPARISON:  May 06, 2020 FINDINGS: The heart size and mediastinal contours are within normal limits. Both lungs are clear. A radiopaque Harrington rod is again seen overlying the lower thoracic spine and upper lumbar spine. No acute  osseous abnormalities are identified. IMPRESSION: Stable exam without acute cardiopulmonary disease. Electronically Signed   By: Virgina Norfolk M.D.   On: 08/23/2020 21:19     Assessment & Plan:   Sinus congestion Sinus Congestion:   Stay well hydrated  Stay active  Deep breathing exercises  May take tylenol for fever or pain  May take mucinex twice daily  Will order azithromycin  Will order Singulair - at bedtime   Loss of taste and smell:  May try online olfactory retraining - essential oils   Insomnia:  Melatonin 1 mg - 1 hour before bedtime  Magnesium glycinate - 400 mg 1 hour before bedtime     Follow up:  Follow up in 4 weeks or sooner if needed     Fenton Foy, NP 09/02/2020

## 2020-09-02 DIAGNOSIS — Z8616 Personal history of COVID-19: Secondary | ICD-10-CM | POA: Insufficient documentation

## 2020-09-02 DIAGNOSIS — R0981 Nasal congestion: Secondary | ICD-10-CM | POA: Insufficient documentation

## 2020-09-02 HISTORY — DX: Personal history of COVID-19: Z86.16

## 2020-09-02 HISTORY — DX: Nasal congestion: R09.81

## 2020-09-02 NOTE — Assessment & Plan Note (Signed)
Sinus Congestion:   Stay well hydrated  Stay active  Deep breathing exercises  May take tylenol for fever or pain  May take mucinex twice daily  Will order azithromycin  Will order Singulair - at bedtime   Loss of taste and smell:  May try online olfactory retraining - essential oils   Insomnia:  Melatonin 1 mg - 1 hour before bedtime  Magnesium glycinate - 400 mg 1 hour before bedtime     Follow up:  Follow up in 4 weeks or sooner if needed

## 2020-09-07 DIAGNOSIS — U099 Post covid-19 condition, unspecified: Secondary | ICD-10-CM | POA: Diagnosis not present

## 2020-09-07 DIAGNOSIS — R0609 Other forms of dyspnea: Secondary | ICD-10-CM | POA: Diagnosis not present

## 2020-09-07 DIAGNOSIS — J9809 Other diseases of bronchus, not elsewhere classified: Secondary | ICD-10-CM | POA: Diagnosis not present

## 2020-09-07 DIAGNOSIS — Z8709 Personal history of other diseases of the respiratory system: Secondary | ICD-10-CM | POA: Diagnosis not present

## 2020-09-11 ENCOUNTER — Telehealth: Payer: Self-pay | Admitting: Emergency Medicine

## 2020-09-11 DIAGNOSIS — R079 Chest pain, unspecified: Secondary | ICD-10-CM | POA: Diagnosis not present

## 2020-09-11 DIAGNOSIS — R0600 Dyspnea, unspecified: Secondary | ICD-10-CM | POA: Diagnosis not present

## 2020-09-11 DIAGNOSIS — R0602 Shortness of breath: Secondary | ICD-10-CM | POA: Diagnosis not present

## 2020-09-11 NOTE — Telephone Encounter (Signed)
Jenny Wright had called earlier and left a message with the front desk regarding the need for a CT scan - pt called back - Pt states she had a CT done today at [facility] and the results will not be back until Mon or Tuesday so she wanted to have another one today if possible.RN spoke with provider here today who stated he would not be able to repeat the CT at this time. Pt instructed to go to ED for fever or SOB. Pt stated she did not have  a pulse ox at home. RN explained to pt how to use one and how to obtain one.Pt is concerned about a possible COVID pneumonia that will only show up on CT. Pt verbalized an understanding that she she should go to the ED (attached to a hospital) if symptoms worsen & was also given # for [facility]. Pt aware that it may not be still open.

## 2020-09-15 ENCOUNTER — Telehealth: Payer: Self-pay | Admitting: Nurse Practitioner

## 2020-09-15 NOTE — Telephone Encounter (Signed)
Attempted to return patient's call x2 attempts - no answer - left voicemail.

## 2020-09-15 NOTE — Telephone Encounter (Signed)
Attempted to return patient's call - no answer. Left voicemail to return call.

## 2020-09-17 DIAGNOSIS — R0602 Shortness of breath: Secondary | ICD-10-CM | POA: Diagnosis not present

## 2020-09-17 DIAGNOSIS — U099 Post covid-19 condition, unspecified: Secondary | ICD-10-CM | POA: Diagnosis not present

## 2020-09-17 DIAGNOSIS — R439 Unspecified disturbances of smell and taste: Secondary | ICD-10-CM | POA: Diagnosis not present

## 2020-09-17 DIAGNOSIS — J309 Allergic rhinitis, unspecified: Secondary | ICD-10-CM | POA: Diagnosis not present

## 2020-09-22 DIAGNOSIS — U099 Post covid-19 condition, unspecified: Secondary | ICD-10-CM | POA: Diagnosis not present

## 2020-09-22 DIAGNOSIS — J984 Other disorders of lung: Secondary | ICD-10-CM | POA: Diagnosis not present

## 2020-09-24 ENCOUNTER — Telehealth: Payer: Self-pay | Admitting: Nurse Practitioner

## 2020-09-28 NOTE — Telephone Encounter (Signed)
Returned patient call and answered question.

## 2020-09-30 ENCOUNTER — Ambulatory Visit (INDEPENDENT_AMBULATORY_CARE_PROVIDER_SITE_OTHER): Payer: BC Managed Care – PPO | Admitting: Nurse Practitioner

## 2020-09-30 VITALS — BP 130/72 | HR 64 | Temp 98.8°F | Resp 18

## 2020-09-30 DIAGNOSIS — M545 Low back pain, unspecified: Secondary | ICD-10-CM

## 2020-09-30 DIAGNOSIS — Z8616 Personal history of COVID-19: Secondary | ICD-10-CM | POA: Diagnosis not present

## 2020-09-30 NOTE — Patient Instructions (Addendum)
Covid 19 Cough:   Stay well hydrated  Stay active  Deep breathing exercises  May take tylenol for fever or pain   Flank pain:  UA    Follow up:  Follow up if needed

## 2020-09-30 NOTE — Progress Notes (Signed)
@Patient  ID: Jenny Wright, female    DOB: [birth year], 55 y.o.   MRN: [id number]  Chief Complaint  Patient presents with   Follow-up    Referring provider: Secundino Ginger, PA-C  HPI  Patient presents today for post-COVID care clinic visit follow-up.  Overall she states that she has improved.  Her sinuses are feeling much clear.  She no longer has a cough or shortness of breath.  She does complain today of new onset of left flank pain.  We will order a UA. Denies f/c/s, n/v/d, hemoptysis, PND, chest pain or edema.      Allergies  Allergen Reactions   Penicillins      There is no immunization history on file for this patient.  Past Medical History:  Diagnosis Date   Anemia    Bell's palsy    Factor V Leiden mutation complicating pregnancy ([place])    Miscarriage    Preeclampsia    pregnancy   Scoliosis     Tobacco History: Social History   Tobacco Use  Smoking Status Never  Smokeless Tobacco Never   Counseling given: Yes   Outpatient Encounter Medications as of 09/30/2020  Medication Sig   sulfamethoxazole-trimethoprim (BACTRIM DS) 800-160 MG tablet Take 1 tablet by mouth 2 (two) times daily for 3 days.   aspirin 81 MG chewable tablet Chew 81 mg by mouth daily.   Cholecalciferol (VITAMIN D3) 1000 units CAPS Take by mouth.   hydroxychloroquine (PLAQUENIL) 200 MG tablet Take 200 mg by mouth 2 (two) times daily.   montelukast (SINGULAIR) 10 MG tablet Take 1 tablet (10 mg total) by mouth at bedtime.   Multiple Vitamin (MULTI-VITAMIN) tablet Take by mouth.   zinc gluconate 50 MG tablet Take 50 mg by mouth daily.   ZITHROMAX Z-PAK 250 MG tablet Take 2 tablets (500 mg) on day 1, then take 1 tablet (250 mg) on days 2-5   [DISCONTINUED] fluticasone (FLONASE) 50 MCG/ACT nasal spray Place 1 spray into both nostrils daily.   [DISCONTINUED] medroxyPROGESTERone (PROVERA) 10 MG tablet Take 1 tablet (10 mg total) by mouth daily.   No facility-administered encounter  medications on file as of 09/30/2020.     Review of Systems  Review of Systems  Constitutional: Negative.  Negative for fatigue and fever.  HENT: Negative.    Respiratory:  Negative for cough and shortness of breath.   Cardiovascular: Negative.   Gastrointestinal: Negative.   Genitourinary:  Positive for flank pain.  Allergic/Immunologic: Negative.   Neurological: Negative.   Psychiatric/Behavioral: Negative.        Physical Exam  BP 130/72   Pulse 64   Temp 98.8 F (37.1 C)   Resp 18   SpO2 100%   Wt Readings from Last 5 Encounters:  08/27/20 169 lb 12.1 oz (77 kg)  08/23/20 169 lb 12.1 oz (77 kg)  08/05/20 169 lb 12.1 oz (77 kg)  07/28/20 170 lb (77.1 kg)  06/27/20 168 lb (76.2 kg)     Physical Exam Vitals and nursing note reviewed.  Constitutional:      General: She is not in acute distress.    Appearance: She is well-developed.  Cardiovascular:     Rate and Rhythm: Normal rate and regular rhythm.  Pulmonary:     Effort: Pulmonary effort is normal.     Breath sounds: Normal breath sounds.  Abdominal:     General: Bowel sounds are normal.     Tenderness: There is no abdominal tenderness.  Hernia: Left femoral hernia: history of covid.  Neurological:     Mental Status: She is alert and oriented to person, place, and time.       Assessment & Plan:   History of COVID-19 Cough:   Stay well hydrated  Stay active  Deep breathing exercises  May take tylenol for fever or pain   Flank pain:  UA    Follow up:  Follow up if needed     Fenton Foy, NP 10/01/2020

## 2020-10-01 DIAGNOSIS — M545 Low back pain, unspecified: Secondary | ICD-10-CM

## 2020-10-01 HISTORY — DX: Low back pain, unspecified: M54.50

## 2020-10-01 LAB — URINALYSIS
Bilirubin, UA: NEGATIVE
Glucose, UA: NEGATIVE
Ketones, UA: NEGATIVE
Nitrite, UA: NEGATIVE
Protein,UA: NEGATIVE
RBC, UA: NEGATIVE
Specific Gravity, UA: 1.023 (ref 1.005–1.030)
Urobilinogen, Ur: 0.2 mg/dL (ref 0.2–1.0)
pH, UA: 6 (ref 5.0–7.5)

## 2020-10-01 MED ORDER — SULFAMETHOXAZOLE-TRIMETHOPRIM 800-160 MG PO TABS: 1.0000 | ORAL_TABLET | Freq: Two times a day (BID) | ORAL | 0 refills | Status: AC

## 2020-10-01 NOTE — Progress Notes (Signed)
Urine dipstick shows positive for leukocytes. Patient is symptomatic - will order short course of bactrim.

## 2020-10-01 NOTE — Assessment & Plan Note (Signed)
Cough:   Stay well hydrated  Stay active  Deep breathing exercises  May take tylenol for fever or pain   Flank pain:  UA    Follow up:  Follow up if needed

## 2020-10-02 ENCOUNTER — Telehealth: Payer: Self-pay | Admitting: Nurse Practitioner

## 2020-10-02 NOTE — Telephone Encounter (Signed)
Pt is asking for a call back

## 2020-10-03 DIAGNOSIS — Z79899 Other long term (current) drug therapy: Secondary | ICD-10-CM | POA: Diagnosis not present

## 2020-10-03 DIAGNOSIS — M545 Low back pain, unspecified: Secondary | ICD-10-CM | POA: Diagnosis not present

## 2020-10-03 DIAGNOSIS — R350 Frequency of micturition: Secondary | ICD-10-CM | POA: Diagnosis not present

## 2020-10-03 DIAGNOSIS — R82998 Other abnormal findings in urine: Secondary | ICD-10-CM | POA: Diagnosis not present

## 2020-10-06 ENCOUNTER — Other Ambulatory Visit: Payer: Self-pay | Admitting: Nurse Practitioner

## 2020-10-06 DIAGNOSIS — Z8616 Personal history of COVID-19: Secondary | ICD-10-CM

## 2020-10-06 DIAGNOSIS — I998 Other disorder of circulatory system: Secondary | ICD-10-CM

## 2020-10-06 NOTE — Progress Notes (Signed)
Patient called requesting referral to cardiology. She has been placed on Metoprolol since diagnosed with covid for fluctuating BP. Patient states that she tried stopping the medication for a few days and BP returned to 791 Sy[address]. Will place referral. Advised to keep BP log for cardiology.

## 2020-10-08 DIAGNOSIS — R439 Unspecified disturbances of smell and taste: Secondary | ICD-10-CM | POA: Diagnosis not present

## 2020-10-08 DIAGNOSIS — U099 Post covid-19 condition, unspecified: Secondary | ICD-10-CM | POA: Diagnosis not present

## 2020-10-08 DIAGNOSIS — J309 Allergic rhinitis, unspecified: Secondary | ICD-10-CM | POA: Diagnosis not present

## 2020-10-14 ENCOUNTER — Other Ambulatory Visit: Payer: Self-pay

## 2020-10-14 ENCOUNTER — Institutional Professional Consult (permissible substitution): Payer: BC Managed Care – PPO | Admitting: Pulmonary Disease

## 2020-10-14 DIAGNOSIS — O039 Complete or unspecified spontaneous abortion without complication: Secondary | ICD-10-CM | POA: Insufficient documentation

## 2020-10-14 DIAGNOSIS — D6851 Activated protein C resistance: Secondary | ICD-10-CM | POA: Insufficient documentation

## 2020-10-14 DIAGNOSIS — O1490 Unspecified pre-eclampsia, unspecified trimester: Secondary | ICD-10-CM | POA: Insufficient documentation

## 2020-10-16 ENCOUNTER — Other Ambulatory Visit: Payer: Self-pay

## 2020-10-16 ENCOUNTER — Ambulatory Visit: Payer: BC Managed Care – PPO | Admitting: Cardiology

## 2020-10-16 ENCOUNTER — Encounter: Payer: Self-pay | Admitting: Cardiology

## 2020-10-16 VITALS — BP 144/83 | Ht 67.0 in | Wt 162.1 lb

## 2020-10-16 DIAGNOSIS — R0600 Dyspnea, unspecified: Secondary | ICD-10-CM

## 2020-10-16 DIAGNOSIS — N951 Menopausal and female climacteric states: Secondary | ICD-10-CM | POA: Diagnosis not present

## 2020-10-16 DIAGNOSIS — Z8616 Personal history of COVID-19: Secondary | ICD-10-CM | POA: Diagnosis not present

## 2020-10-16 DIAGNOSIS — I10 Essential (primary) hypertension: Secondary | ICD-10-CM | POA: Diagnosis not present

## 2020-10-16 DIAGNOSIS — R0609 Other forms of dyspnea: Secondary | ICD-10-CM | POA: Insufficient documentation

## 2020-10-16 DIAGNOSIS — R109 Unspecified abdominal pain: Secondary | ICD-10-CM | POA: Diagnosis not present

## 2020-10-16 NOTE — Progress Notes (Signed)
Cardiology Office Note:    Date:  10/16/2020   ID:  Jenny Wright, DOB [birth year], MRN [id number]  PCP:  Secundino Ginger, PA-C  Cardiologist:  Jenean Lindau, MD   Referring MD: Jenny Foy, NP    ASSESSMENT:    1. History of COVID-19   2. Dyspnea on exertion   3. Essential hypertension    PLAN:    In order of problems listed above:  Primary prevention stressed with the patient.  Importance of compliance with diet medication stressed and she vocalized understanding. Dyspnea on exertion: I will do exercise stress echo to assess this.  If this is negative then she was advised to embark on an exercise program in a graded fashion and she agrees to do so. Essential hypertension: She is on a low-dose of beta-blocker and wants to get off that medication.  I told her that lifestyle modification and salt intake and dietary issues and measures such as yoga and meditation may help this issue and she is promising to do that for me.  We will reassess her blood pressure in 2 months or earlier if she has any concerns. Coronary risk stratification: She is concerned about this and we will do a calcium scoring CT scan and she is agreeable. Patient will be seen in follow-up appointment in 6 months or earlier if the patient has any concerns    Medication Adjustments/Labs and Tests Ordered: Current medicines are reviewed at length with the patient today.  Concerns regarding medicines are outlined above.  No orders of the defined types were placed in this encounter.  No orders of the defined types were placed in this encounter.    History of Present Illness:    Jenny Wright is a 55 y.o. female who is being seen today for the evaluation of essential hypertension at the request of Jenny Foy, NP.  Patient is a pleasant 55 year old female.  She has past medical history of recently diagnosed essential hypertension.  She denies any history of dyslipidemia diabetes mellitus or  any such issues.  No chest pain orthopnea or PND.  She has some dyspnea on exertion.  Overall she leads a sedentary lifestyle but plans to begin an exercise program.  She wants to get off her blood pressure medication by lifestyle modification and therefore she is here for follow-up.  At the time of my evaluation, the patient is alert awake oriented and in no distress.  Past Medical History:  Diagnosis Date   Acute bilateral low back pain without sciatica 10/01/2020   Anemia    Antiphospholipid antibody positive 02/13/2012   Arthralgia of right temporomandibular joint 01/03/2017   Bell's palsy    Bilateral impacted cerumen 09/29/2015   BPPV (benign paroxysmal positional vertigo) 09/29/2015   DDD (degenerative disc disease), lumbar 10/07/2014   Factor V Leiden mutation complicating pregnancy ([place])    Herpes simplex type 2 infection 02/01/2013   History of COVID-19 09/02/2020   Laryngopharyngeal reflux 06/29/2020   Menstrual migraine without status migrainosus, not intractable 03/12/2014   Miscarriage    Onychomycosis 08/29/2012   Preeclampsia    pregnancy   Rash and nonspecific skin eruption 08/29/2012   Scoliosis    Seasonal allergic rhinitis 01/03/2017   Sensorineural hearing loss (SNHL) of both ears 09/29/2015   Sinus congestion 09/02/2020   Systemic lupus erythematosus (SLE) inhibitor ([place]) 02/13/2012   Formatting of this note might be different from the original. Last Assessment & Plan:  Discussed possible hematology consult  in future for more definitive dx.   Throat irritation 03/19/2019   Tinea pedis 08/29/2012   Uterine fibroid 02/13/2012   Uterine leiomyoma 02/13/2012    Past Surgical History:  Procedure Laterality Date   BACK SURGERY     CESAREAN SECTION     harrington rod  1980    Current Medications: Current Meds  Medication Sig   Cholecalciferol (VITAMIN D) 50 MCG (2000 UT) tablet Take 2,000 Units by mouth daily.   [employer] VITAMIN C 1000 MG tablet Take 2,000 mg by mouth  daily.   metoprolol succinate (TOPROL-XL) 25 MG 24 hr tablet Take 25 mg by mouth daily.   Multiple Vitamin (MULTI-VITAMIN) tablet Take 1 tablet by mouth daily.   zinc gluconate 50 MG tablet Take 50 mg by mouth daily.     Allergies:   Penicillins, Methylprednisolone, and Omeprazole   Social History   Socioeconomic History   Marital status: Divorced    Spouse name: Not on file   Number of children: Not on file   Years of education: Not on file   Highest education level: Not on file  Occupational History   Not on file  Tobacco Use   Smoking status: Never   Smokeless tobacco: Never  Substance and Sexual Activity   Alcohol use: No   Drug use: No   Sexual activity: Not Currently    Birth control/protection: None  Other Topics Concern   Not on file  Social History Narrative   Not on file   Social Determinants of Health   Financial Resource Strain: Not on file  Food Insecurity: Not on file  Transportation Needs: Not on file  Physical Activity: Not on file  Stress: Not on file  Social Connections: Not on file     Family History: The patient's family history includes Breast cancer in her mother; Heart disease in her father; Lung cancer in her brother.  ROS:   Please see the history of present illness.    All other systems reviewed and are negative.  EKGs/Labs/Other Studies Reviewed:    The following studies were reviewed today: EKG reveals sinus rhythm and nonspecific ST-T changes   Recent Labs: 08/23/2020: ALT 19; BUN 11; Creatinine, Ser 0.80; Hemoglobin 12.8; Platelets 216; Potassium 3.6; Sodium 138  Recent Lipid Panel No results found for: CHOL, TRIG, HDL, CHOLHDL, VLDL, LDLCALC, LDLDIRECT  Physical Exam:    VS:  BP (!) 144/83   Ht '5\' 7"'$  (1.702 m)   Wt 162 lb 1.9 oz (73.5 kg)   SpO2 97%   BMI 25.39 kg/m     Wt Readings from Last 3 Encounters:  10/16/20 162 lb 1.9 oz (73.5 kg)  08/27/20 169 lb 12.1 oz (77 kg)  08/23/20 169 lb 12.1 oz (77 kg)     GEN:  Patient is in no acute distress HEENT: Normal NECK: No JVD; No carotid bruits LYMPHATICS: No lymphadenopathy CARDIAC: S1 S2 regular, 2/6 systolic murmur at the apex. RESPIRATORY:  Clear to auscultation without rales, wheezing or rhonchi  ABDOMEN: Soft, non-tender, non-distended MUSCULOSKELETAL:  No edema; No deformity  SKIN: Warm and dry NEUROLOGIC:  Alert and oriented x 3 PSYCHIATRIC:  Normal affect    Signed, Jenean Lindau, MD  10/16/2020 10:48 AM    [facility]

## 2020-10-16 NOTE — Patient Instructions (Signed)
Medication Instructions:  No medication changes. *If you need a refill on your cardiac medications before your next appointment, please call your pharmacy*   Lab Work: None ordered If you have labs (blood work) drawn today and your tests are completely normal, you will receive your results only by: [place] (if you have MyChart) OR A paper copy in the mail If you have any lab test that is abnormal or we need to change your treatment, we will call you to review the results.   Testing/Procedures:  We will order CT coronary calcium score. It will cost $99.00 and is not covered by insurance.  Please call [phone] to schedule.   [facility]  [id number], [place] [address]      Stress Echocardiogram Information Sheet                                                      Instructions:    1. You may take your morning medications the morning of the test  2. Light breakfast no caffeine  3. Dress prepared to exercise.  4. DO NOT use ANY caffeine or tobacco products 3 hours before appointment.  5. Please bring all current prescription medications.   Follow-Up: At [facility], you and your health needs are our priority.  As part of our continuing mission to provide you with exceptional heart care, we have created designated Provider Care Teams.  These Care Teams include your primary Cardiologist (physician) and Advanced Practice Providers (APPs -  Physician Assistants and Nurse Practitioners) who all work together to provide you with the care you need, when you need it.  We recommend signing up for the patient portal called "MyChart".  Sign up information is provided on this After Visit Summary.  MyChart is used to connect with patients for Virtual Visits (Telemedicine).  Patients are able to view lab/test results, encounter notes, upcoming appointments, etc.  Non-urgent messages can be sent to your provider as well.   To learn more about what you can  do with MyChart, go to [email or web address].    Your next appointment:   2 month(s)  The format for your next appointment:   In Person  Provider:   Jyl Heinz, MD   Other Instructions  Coronary Calcium Scan A coronary calcium scan is an imaging test used to look for deposits of plaque in the inner lining of the blood vessels of the heart (coronary arteries). Plaque is made up of calcium, protein, and fatty substances. These deposits of plaque can partly clog and narrow the coronary arteries without producing any symptoms or warning signs. This puts a person at risk for a heart attack. This test is recommended for people who are at moderate risk for heart disease. The test can find plaque deposits before symptoms develop. Tell a health care provider about: Any allergies you have. All medicines you are taking, including vitamins, herbs, eye drops, creams, and over-the-counter medicines. Any problems you or family members have had with anesthetic medicines. Any blood disorders you have. Any surgeries you have had. Any medical conditions you have. Whether you are pregnant or may be pregnant. What are the risks? Generally, this is a safe procedure. However, problems may occur, including: Harm to a pregnant woman and her unborn baby. This test involves the  use of radiation. Radiation exposure can be dangerous to a pregnant woman and her unborn baby. If you are pregnant or think you may be pregnant, you should not have this procedure done. Slight increase in the risk of cancer. This is because of the radiation involved in the test. What happens before the procedure? Ask your health care provider for any specific instructions on how to prepare for this procedure. You may be asked to avoid products that contain caffeine, tobacco, or nicotine for 4 hours before the procedure. What happens during the procedure? You will undress and remove any jewelry from your neck or chest. You will put  on a hospital gown. Sticky electrodes will be placed on your chest. The electrodes will be connected to an electrocardiogram (ECG) machine to record a tracing of the electrical activity of your heart. You will lie down on a curved bed that is attached to the [place]. You may be given medicine to slow down your heart rate so that clear pictures can be created. You will be moved into the CT scanner, and the CT scanner will take pictures of your heart. During this time, you will be asked to lie still and hold your breath for 2-3 seconds at a time while each picture of your heart is being taken. The procedure may vary among health care providers and hospitals.    What happens after the procedure? You can get dressed. You can return to your normal activities. It is up to you to get the results of your procedure. Ask your health care provider, or the department that is doing the procedure, when your results will be ready. Summary A coronary calcium scan is an imaging test used to look for deposits of plaque in the inner lining of the blood vessels of the heart (coronary arteries). Plaque is made up of calcium, protein, and fatty substances. Generally, this is a safe procedure. Tell your health care provider if you are pregnant or may be pregnant. Ask your health care provider for any specific instructions on how to prepare for this procedure. A CT scanner will take pictures of your heart. You can return to your normal activities after the scan is done. This information is not intended to replace advice given to you by your health care provider. Make sure you discuss any questions you have with your health care provider. Document Revised: 09/25/2018 Document Reviewed: 09/25/2018 Elsevier Patient Education  [place].

## 2020-10-19 ENCOUNTER — Telehealth: Payer: Self-pay | Admitting: Student in an Organized Health Care Education/Training Program

## 2020-10-19 NOTE — Telephone Encounter (Signed)
Paged by operator Feels like she is having breathlessness Has felt like this once before with covid several months ago and had lost sense of smell during that time. Concerned that she has HTN and maybe HLD and that she didn't tell this to cardiologist in clinic. She doesn't know how to describe it other than saying she is having difficult time breathing but doesn't feel stopped up. Feels her breathing is not normal. Her O2 levels are fine. BP 116/65. HR 50. O2 sat 98% on RA. Pinlike feelings in legs. Has had some sweating. Wants to "convey this data point to cardiologist."   I explained that although her sx do not sound highly concerning for a medical emergency there is no way to no without being seen in person. Differential dx would be broad including panic attack, URI, cardiac event, PE and others. We discussed her sx for 15-20 minutes and there were no features that were particularly alarming other than the dyspnea. She wants to f/u with a different cardiologist at [facility] as she is upset that lipids weren't checked at her recent appt.   Reviewed her chart in further detail and called her back to provide information on rescheduling appts. Left VM with main scheduling number provided for contacting main group for rescheduling. Advised to come to ED for more urgent evaluation if sx were worsening.

## 2020-10-21 ENCOUNTER — Telehealth (HOSPITAL_COMMUNITY): Payer: Self-pay | Admitting: Cardiology

## 2020-10-21 NOTE — Telephone Encounter (Signed)
I called patient to schedule stress echocardiogram per your order.  Patient declined to schedule and states she will just have the CT done. Order will be removed from the ECHO WQ. Thank you.

## 2020-10-22 ENCOUNTER — Ambulatory Visit: Payer: Self-pay | Admitting: Allergy

## 2020-11-06 ENCOUNTER — Telehealth: Payer: Self-pay | Admitting: Interventional Cardiology

## 2020-11-06 DIAGNOSIS — R079 Chest pain, unspecified: Secondary | ICD-10-CM | POA: Diagnosis not present

## 2020-11-06 DIAGNOSIS — E119 Type 2 diabetes mellitus without complications: Secondary | ICD-10-CM | POA: Diagnosis not present

## 2020-11-06 DIAGNOSIS — I10 Essential (primary) hypertension: Secondary | ICD-10-CM | POA: Diagnosis not present

## 2020-11-06 DIAGNOSIS — Z1159 Encounter for screening for other viral diseases: Secondary | ICD-10-CM | POA: Diagnosis not present

## 2020-11-06 NOTE — Telephone Encounter (Signed)
   Pt is very adamant to switch with Dr. Tamala Julian. Advised that Dr. Tamala Julian no longer accepting provider switch request because he is retiring soon, she said she doesn't care if she have to switch again in a year he only wants to see Dr. Tamala Julian, offered other provider in the practice and he said based on all reviews he only wants Dr. Tamala Julian

## 2020-11-12 NOTE — Telephone Encounter (Signed)
   Pt requesting to switch from Dr. Geraldo Pitter to Dr. Tamala Julian. Needs Dr. Freddi Che approval

## 2020-11-13 ENCOUNTER — Ambulatory Visit (INDEPENDENT_AMBULATORY_CARE_PROVIDER_SITE_OTHER)
Admission: RE | Admit: 2020-11-13 | Discharge: 2020-11-13 | Disposition: A | Discharge status: Home / Self Care | Payer: Self-pay | Source: Ambulatory Visit | Attending: Cardiology | Admitting: Cardiology

## 2020-11-13 ENCOUNTER — Other Ambulatory Visit: Payer: Self-pay

## 2020-11-13 DIAGNOSIS — R0600 Dyspnea, unspecified: Secondary | ICD-10-CM

## 2020-11-17 ENCOUNTER — Telehealth: Payer: Self-pay

## 2020-11-17 NOTE — Telephone Encounter (Signed)
Called patient to give results for her CT score.  She thanked me for the call and wanted to know when she could go off of her metoprolol because she does not want to be on it long term.  She also informed me she will be switching providers as it will be closer to her and more convenient for her.  I suggested she keep a log of her blood pressures and submit for her review to the cardiologist she will be seeing.  She verbalized understanding and had no additional questions.

## 2020-12-22 ENCOUNTER — Other Ambulatory Visit: Payer: Self-pay

## 2020-12-22 ENCOUNTER — Ambulatory Visit (HOSPITAL_BASED_OUTPATIENT_CLINIC_OR_DEPARTMENT_OTHER): Payer: BC Managed Care – PPO | Admitting: Family

## 2020-12-22 ENCOUNTER — Encounter (HOSPITAL_BASED_OUTPATIENT_CLINIC_OR_DEPARTMENT_OTHER): Payer: Self-pay | Admitting: Family

## 2020-12-22 VITALS — BP 116/72 | HR 52 | Ht 67.0 in | Wt 169.4 lb

## 2020-12-22 DIAGNOSIS — R0609 Other forms of dyspnea: Secondary | ICD-10-CM

## 2020-12-22 DIAGNOSIS — I10 Essential (primary) hypertension: Secondary | ICD-10-CM

## 2020-12-22 DIAGNOSIS — Z8616 Personal history of COVID-19: Secondary | ICD-10-CM | POA: Diagnosis not present

## 2020-12-22 NOTE — Patient Instructions (Addendum)
Medication Instructions:  Your physician has recommended you make the following change in your medication:  STOP Metoprolol  If your blood pressure is consistently more than 130/80 please call our office   *If you need a refill on your cardiac medications before your next appointment, please call your pharmacy*  Lab Work: None ordered today.  Testing/Procedures: Your cardiac CT showed no plaque buildup in your heart which is a great result!  Follow-Up: At [facility], you and your health needs are our priority.  As part of our continuing mission to provide you with exceptional heart care, we have created designated Provider Care Teams.  These Care Teams include your primary Cardiologist (physician) and Advanced Practice Providers (APPs -  Physician Assistants and Nurse Practitioners) who all work together to provide you with the care you need, when you need it.  We recommend signing up for the patient portal called "MyChart".  Sign up information is provided on this After Visit Summary.  MyChart is used to connect with patients for Virtual Visits (Telemedicine).  Patients are able to view lab/test results, encounter notes, upcoming appointments, etc.  Non-urgent messages can be sent to your provider as well.   To learn more about what you can do with MyChart, go to [email or web address].    Your next appointment:   As scheduled in March with Dr. Tamala Julian  Other Instructions  Primary care: [facility] [address],  [place]  [address] Get Driving Directions Main: [phone] *Or any [facility] primary care*  Tips to Measure your Blood Pressure Correctly  Here's what you can do to ensure a correct reading:  Don't drink a caffeinated beverage or smoke during the 30 minutes before the test.  Sit quietly for five minutes before the test begins.  During the measurement, sit in a chair with your feet on the floor and your arm  supported so your elbow is at about heart level.  The inflatable part of the cuff should completely cover at least 80% of your upper arm, and the cuff should be placed on bare skin, not over a shirt.  Don't talk during the measurement.  Have your blood pressure measured twice, with a brief break in between. If the readings are different by 5 points or more, have it done a third time.  In 2017, new guidelines from the [place], the [employer] of Cardiology, and nine other health organizations lowered the diagnosis of high blood pressure to 130/80 mm Hg or higher for all adults. The guidelines also redefined the various blood pressure categories to now include normal, elevated, Stage 1 hypertension, Stage 2 hypertension, and hypertensive crisis (see "Blood pressure categories").  Blood pressure categories  Blood pressure category SYSTOLIC (upper number)  DIASTOLIC (lower number)  Normal Less than 120 mm Hg and Less than 80 mm Hg  Elevated 120-129 mm Hg and Less than 80 mm Hg  High blood pressure: Stage 1 hypertension 130-139 mm Hg or 80-89 mm Hg  High blood pressure: Stage 2 hypertension 140 mm Hg or higher or 90 mm Hg or higher  Hypertensive crisis (consult your doctor immediately) Higher than 180 mm Hg and/or Higher than 120 mm Hg  Source: American Heart Association and American Stroke Association. For more on getting your blood pressure under control, buy Controlling Your Blood Pressure, a Special Health Report from [facility].   Blood Pressure Log   Date   Time  Blood  Pressure  Position  Example: Nov 1 9 AM 124/78 sitting                                                    Heart Healthy Diet Recommendations: A low-salt diet is recommended. Meats should be grilled, baked, or boiled. Avoid fried foods. Focus on lean protein sources like fish or chicken with vegetables and fruits. The American Heart Association is a Microbiologist!   The American Diabetes Association also has some great recipes.   Exercise recommendations: The American Heart Association recommends 150 minutes of moderate intensity exercise weekly. Try 30 minutes of moderate intensity exercise 4-5 times per week. This could include walking, jogging, or swimming.

## 2020-12-22 NOTE — Progress Notes (Signed)
Office Visit    Patient Name: Jenny Wright Date of Encounter: 12/22/2020  PCP:  Pcp, No   [place]  Cardiologist:  Sinclair Grooms, MD  Advanced Practice Provider:  No care team member to display Electrophysiologist:  None     Chief Complaint    Jenny Wright is a 55 y.o. female with a hx of hypertension, COVID19 07/2020, chart notes history of Factor V Leiden mutation complicating pregnancy as well as preeclampsia  presents today for discussion of medications   Past Medical History    Past Medical History:  Diagnosis Date   Acute bilateral low back pain without sciatica 10/01/2020   Anemia    Antiphospholipid antibody positive 02/13/2012   Arthralgia of right temporomandibular joint 01/03/2017   Bell's palsy    Bilateral impacted cerumen 09/29/2015   BPPV (benign paroxysmal positional vertigo) 09/29/2015   DDD (degenerative disc disease), lumbar 10/07/2014   Factor V Leiden mutation complicating pregnancy ([place])    Herpes simplex type 2 infection 02/01/2013   History of COVID-19 09/02/2020   Laryngopharyngeal reflux 06/29/2020   Menstrual migraine without status migrainosus, not intractable 03/12/2014   Miscarriage    Onychomycosis 08/29/2012   Preeclampsia    pregnancy   Rash and nonspecific skin eruption 08/29/2012   Scoliosis    Seasonal allergic rhinitis 01/03/2017   Sensorineural hearing loss (SNHL) of both ears 09/29/2015   Sinus congestion 09/02/2020   Systemic lupus erythematosus (SLE) inhibitor ([place]) 02/13/2012   Formatting of this note might be different from the original. Last Assessment & Plan:  Discussed possible hematology consult in future for more definitive dx.   Throat irritation 03/19/2019   Tinea pedis 08/29/2012   Uterine fibroid 02/13/2012   Uterine leiomyoma 02/13/2012   Past Surgical History:  Procedure Laterality Date   BACK SURGERY     CESAREAN SECTION     harrington rod  1980    Allergies  Allergies   Allergen Reactions   Penicillins Anaphylaxis, Shortness Of Breath and Swelling    SOB     Methylprednisolone Other (See Comments)    shakes   Omeprazole Palpitations    History of Present Illness    Jenny Wright is a 55 y.o. female with a hx of chart notes Bell's palsy, HTN, COVID 19 07/2020, history of Factor V Leiden mutation complicating pregnancy as well as preeclampsia last seen by Dr. Geraldo Pitter 10/16/20. She has requested to switch providers to Dr. Tamala Julian.  Seen by Dr. Geraldo Pitter 10/16/20. She had been started on Toprol 25mg  QD due to hypertension by her primary care provider. Given dyspnea on exertion stress echo was ordered but declined. She was concerned about coronary artery disease and subsequent coronary calcium score of 0.   She presents today for follow up. Tells me her previous primary care provider did not recommend COVID19 vaccination due to her allergy to penicilin and difficulties with multiple medications. Shares with me that she was diagnosed with COVID 07/2020. Her primary are started her on steroid and antibiotic. She did not want to take prednisone due to history of weight gain on prednisone when she had Bell's palsy. She shares with me she had a reaction to methylprednisolone with shaking. This is when her high blood pressure developed. Only previously had high blood pressure with pregnancy in 2007 where she was only treated in the hospital and did not require medications at home. 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 She really  desires to be off of medication, Metoprolol specifically. Her blood pressure has been checked periodically at home with arm cuff. Last checked one month ago with readings 130s. Reports no chest pain, pressure, or tightness. No edema, orthopnea, PND. Reports no palpitations.    EKGs/Labs/Other Studies Reviewed:   The following studies were reviewed today:  CT November 23, 2020 IMPRESSION: Coronary calcium score of 0. This was 0 percentile for  age-, race-, and sex-matched controls.   COMPARISON:  None.   FINDINGS: Within the visualized portions of the thorax there are no suspicious appearing pulmonary nodules or masses, there is no acute consolidative airspace disease, no pleural effusions, no pneumothorax and no lymphadenopathy. Visualized portions of the upper abdomen are unremarkable. There are no aggressive appearing lytic or blastic lesions noted in the visualized portions of the skeleton. Orthopedic fixation hardware in the lower thoracic and lumbar spine incompletely imaged.   IMPRESSION: 1. No significant incidental noncardiac findings are noted.  EKG:  No EKG today.  Recent Labs: 08/23/2020: ALT 19; BUN 11; Creatinine, Ser 0.80; Hemoglobin 12.8; Platelets 216; Potassium 3.6; Sodium 138  Recent Lipid Panel No results found for: CHOL, TRIG, HDL, CHOLHDL, VLDL, LDLCALC, LDLDIRECT  Home Medications   Current Meds  Medication Sig   Cholecalciferol (VITAMIN D) 50 MCG (2000 UT) tablet Take 2,000 Units by mouth daily.   CVS VITAMIN C 1000 MG tablet Take 2,000 mg by mouth daily.   metoprolol succinate (TOPROL-XL) 25 MG 24 hr tablet Take 25 mg by mouth daily.   Multiple Vitamin (MULTI-VITAMIN) tablet Take 1 tablet by mouth daily.   zinc gluconate 50 MG tablet Take 50 mg by mouth daily.     Review of Systems      All other systems reviewed and are otherwise negative except as noted above.  Physical Exam    VS:  BP 116/72   Pulse (!) 52   Ht 5\' 7"  (1.702 m)   Wt 169 lb 6.4 oz (76.8 kg)   BMI 26.53 kg/m  , BMI Body mass index is 26.53 kg/m.  Wt Readings from Last 3 Encounters:  12/22/20 169 lb 6.4 oz (76.8 kg)  10/16/20 162 lb 1.9 oz (73.5 kg)  08/27/20 169 lb 12.1 oz (77 kg)    GEN: Well nourished, well developed, in no acute distress. HEENT: normal. Neck: Supple, no JVD, carotid bruits, or masses. Cardiac: RRR, no murmurs, rubs, or gallops. No clubbing, cyanosis, edema.  Radials/PT 2+ and equal  bilaterally.  Respiratory:  Respirations regular and unlabored, clear to auscultation bilaterally. GI: Soft, nontender, nondistended. MS: No deformity or atrophy. Skin: Warm and dry, no rash. Neuro:  Strength and sensation are intact. Psych: Normal affect.  Assessment & Plan    HTN - BP today is well controlled 116/72. Endorses taking her Toprol intermittently. She desires to be off of her medications and endorses taking only intermittently. Low suspicion Toprol is having much impact on her blood pressure. A such, we will stop Toprol. She will monitor her blood pressure at home with arm cuff and if consistently more than 130/80 will reach out to [place].    Dyspnea on exertion - Overall this seems to be improving. Likely related to deconditioning and history of COVID 19. She plans to start an exercise regimen. No indication for further cardiac workup. Cardiac CT with calcium score of zero.  Overweight - Weight loss via diet and exercise encouraged. Discussed the impact being overweight would have on cardiovascular risk. She is very interested in losing  weight and has joined the [employer].  History of COVID 78 - Has been diagnosed with post-COVID syndrome.   Disposition: Follow up  as scheduled in March  with Dr. Tamala Julian   Signed, Loel Dubonnet, NP 12/22/2020, 8:28 AM [place]

## 2020-12-24 ENCOUNTER — Other Ambulatory Visit: Payer: Self-pay

## 2020-12-24 ENCOUNTER — Telehealth (HOSPITAL_BASED_OUTPATIENT_CLINIC_OR_DEPARTMENT_OTHER): Payer: Self-pay | Admitting: Family

## 2020-12-24 ENCOUNTER — Emergency Department (HOSPITAL_BASED_OUTPATIENT_CLINIC_OR_DEPARTMENT_OTHER): Payer: BC Managed Care – PPO

## 2020-12-24 ENCOUNTER — Encounter (HOSPITAL_BASED_OUTPATIENT_CLINIC_OR_DEPARTMENT_OTHER): Payer: Self-pay | Admitting: Emergency Medicine

## 2020-12-24 DIAGNOSIS — M7989 Other specified soft tissue disorders: Secondary | ICD-10-CM | POA: Diagnosis not present

## 2020-12-24 DIAGNOSIS — S9032XA Contusion of left foot, initial encounter: Secondary | ICD-10-CM | POA: Diagnosis not present

## 2020-12-24 DIAGNOSIS — W208XXA Other cause of strike by thrown, projected or falling object, initial encounter: Secondary | ICD-10-CM | POA: Insufficient documentation

## 2020-12-24 DIAGNOSIS — Z8616 Personal history of COVID-19: Secondary | ICD-10-CM | POA: Insufficient documentation

## 2020-12-24 DIAGNOSIS — M79672 Pain in left foot: Secondary | ICD-10-CM | POA: Insufficient documentation

## 2020-12-24 DIAGNOSIS — S9782XA Crushing injury of left foot, initial encounter: Secondary | ICD-10-CM | POA: Diagnosis not present

## 2020-12-24 DIAGNOSIS — M79675 Pain in left toe(s): Secondary | ICD-10-CM | POA: Diagnosis not present

## 2020-12-24 DIAGNOSIS — T1490XA Injury, unspecified, initial encounter: Secondary | ICD-10-CM

## 2020-12-24 DIAGNOSIS — S99922A Unspecified injury of left foot, initial encounter: Secondary | ICD-10-CM | POA: Diagnosis not present

## 2020-12-24 NOTE — Telephone Encounter (Signed)
Metoprolol at a low dose of 25mg  does not need to be weaned. Typically only requires weaning at higher doses. BP is markedly elevated at home. If readings are consistently >130/80 would recommend initiation of antihypertensive. Could resume previous Toprol 25mg  daily. Given marked difference between home readings and clinic concern her home BP cuff may be inaccurate. Ensure she is taking BP while sitting resting.   Loel Dubonnet, NP

## 2020-12-24 NOTE — ED Triage Notes (Signed)
Reports she dropped a case of water on her left foot today.  Having pain and swelling.

## 2020-12-24 NOTE — Telephone Encounter (Signed)
Discussed with patient, patient aware of recommendations.    Advised to monitor BP 2 times a day, if consistently >130/80 to notify office.  Also advised to monitor sodium intake.  Patient verbalized understanding.

## 2020-12-24 NOTE — Telephone Encounter (Signed)
New Message:    Patient said she saw Jenny Wright on Tuesday(12-22-20). Jenny Wright told her to stop her Metoprolol.Patient said she started having headaches last night. She contacted her pharmacist and she told her to contact her doctor. She said the pharmacist said Metoprolol is not a medicine you just stop, you have to be weaned off of it., Please call asap.,so she will know what to do.

## 2020-12-24 NOTE — Telephone Encounter (Signed)
Returned call to patient-patient states she saw Laurann Montana NP 10/4 and her metoprolo was stopped.  She was informed this needed to be weaned.   She states she has had a HA since last night and is concerned in the significant change in her home blood pressure readings vs the in office reading.  BP today 178/98 HR 73 and 148/93 HR 79.  No blurred vision, chest pain, other symptoms.     Per OV note:  HTN - BP today is well controlled 116/72. Endorses taking her Toprol intermittently. She desires to be off of her medications and endorses taking only intermittently. Low suspicion Toprol is having much impact on her blood pressure. A such, we will stop Toprol. She will monitor her blood pressure at home with arm cuff and if consistently more than 130/80 will reach out to [place].    Patient states she was taking it daily just at different times.    Advised would send message to [facility] NP to review and advise.     Patient aware.

## 2020-12-25 ENCOUNTER — Emergency Department (HOSPITAL_BASED_OUTPATIENT_CLINIC_OR_DEPARTMENT_OTHER)
Admission: EM | Admit: 2020-12-25 | Discharge: 2020-12-25 | Disposition: A | Discharge status: Home / Self Care | Payer: BC Managed Care – PPO | Attending: Emergency Medicine | Admitting: Emergency Medicine

## 2020-12-25 DIAGNOSIS — S9782XA Crushing injury of left foot, initial encounter: Secondary | ICD-10-CM

## 2020-12-25 DIAGNOSIS — T1490XA Injury, unspecified, initial encounter: Secondary | ICD-10-CM

## 2020-12-25 NOTE — ED Provider Notes (Signed)
[facility] DEPT [facility] Provider Note: [name], MD, FACEP  CSN: [id number] MRN: [id number] ARRIVAL: 12/24/20 at [place]: [place] [facility]   HISTORY OF PRESENT ILLNESS  12/25/20 12:30 AM Jenny Wright is a 55 y.o. 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  She now has swelling and pain to her dorsal left foot.  She rates her pain as an 8 out of 10, aching in nature.  It is worse with ambulation.   Past Medical History:  Diagnosis Date   Acute bilateral low back pain without sciatica 10/01/2020   Anemia    Antiphospholipid antibody positive 02/13/2012   Arthralgia of right temporomandibular joint 01/03/2017   Bell's palsy    Bilateral impacted cerumen 09/29/2015   BPPV (benign paroxysmal positional vertigo) 09/29/2015   DDD (degenerative disc disease), lumbar 10/07/2014   Factor V Leiden mutation complicating pregnancy ([place])    Herpes simplex type 2 infection 02/01/2013   History of COVID-19 09/02/2020   Laryngopharyngeal reflux 06/29/2020   Menstrual migraine without status migrainosus, not intractable 03/12/2014   Miscarriage    Onychomycosis 08/29/2012   Preeclampsia    pregnancy   Rash and nonspecific skin eruption 08/29/2012   Scoliosis    Seasonal allergic rhinitis 01/03/2017   Sensorineural hearing loss (SNHL) of both ears 09/29/2015   Sinus congestion 09/02/2020   Systemic lupus erythematosus (SLE) inhibitor ([place]) 02/13/2012   Formatting of this note might be different from the original. Last Assessment & Plan:  Discussed possible hematology consult in future for more definitive dx.   Throat irritation 03/19/2019   Tinea pedis 08/29/2012   Uterine fibroid 02/13/2012   Uterine leiomyoma 02/13/2012    Past Surgical History:  Procedure Laterality Date   BACK SURGERY     CESAREAN SECTION     harrington rod  1980    Family History  Problem Relation Age of Onset    Breast cancer Mother        deceased   Heart disease Father        deceased   Lung cancer Brother        deceased    Social History   Tobacco Use   Smoking status: Never   Smokeless tobacco: Never  Substance Use Topics   Alcohol use: No   Drug use: No    Prior to Admission medications   Medication Sig Start Date End Date Taking? Authorizing Provider  Cholecalciferol (VITAMIN D) 50 MCG (2000 UT) tablet Take 2,000 Units by mouth daily. 07/27/20   [provider]  CVS VITAMIN C 1000 MG tablet Take 2,000 mg by mouth daily. 07/27/20   [provider]  Multiple Vitamin (MULTI-VITAMIN) tablet Take 1 tablet by mouth daily.    [provider]  zinc gluconate 50 MG tablet Take 50 mg by mouth daily. 07/27/20   [provider]    Allergies Penicillins, Methylprednisolone, and Omeprazole   REVIEW OF SYSTEMS  Negative except as noted here or in the History of Present Illness.   PHYSICAL EXAMINATION  Initial Vital Signs Blood pressure (!) 150/90, pulse 79, temperature 98.7 F (37.1 C), temperature source Oral, resp. rate 18, height 5\' 7"  (1.702 m), weight 76.8 kg, SpO2 99 %.  Examination General: Well-developed, well-nourished female in no acute distress; appearance consistent with age of record HENT: normocephalic; atraumatic Eyes: Normal appearance Neck: supple Heart: regular rate  and rhythm Lungs: clear to auscultation bilaterally Abdomen: soft; nondistended; nontender; bowel sounds present Extremities: No deformity; edema and tenderness of dorsal left foot, foot distally neurovascularly intact with intact tendon function:    Neurologic: Awake, alert and oriented; motor function intact in all extremities and symmetric; no facial droop Skin: Warm and dry Psychiatric: Normal mood and affect   RESULTS  Summary of this visit's results, reviewed and interpreted by myself:   EKG Interpretation  Date/Time:    Ventricular Rate:    PR  Interval:    QRS Duration:   QT Interval:    QTC Calculation:   R Axis:     Text Interpretation:         Laboratory Studies: No results found for this or any previous visit (from the past 24 hour(s)). Imaging Studies: DG Foot Complete Left  Result Date: 12/24/2020 CLINICAL DATA:  Injury, dropped case of water on top of foot. EXAM: LEFT FOOT - COMPLETE 3+ VIEW COMPARISON:  None. FINDINGS: There is soft tissue swelling over the dorsum of the foot. There is no acute fracture or dislocation identified. Joint spaces are maintained. IMPRESSION: 1. No acute fracture or dislocation. 2. Soft tissue swelling over the dorsum of the foot. Electronically Signed   By: Ronney Asters M.D.   On: 12/24/2020 23:40    ED COURSE and MDM  Nursing notes, initial and subsequent vitals signs, including pulse oximetry, reviewed and interpreted by myself.  Vitals:   12/24/20 2308 12/24/20 2309  BP:  (!) 150/90  Pulse:  79  Resp:  18  Temp:  98.7 F (37.1 C)  TempSrc:  Oral  SpO2:  99%  Weight: 76.8 kg   Height: 5\' 7"  (1.702 m)    Medications - No data to display  No evidence of bony injury on radiograph.  Sensation and capillary refill in toes is brisk and tendon function to toes is intact.  We will provide a postop shoe for comfort.  We recommend ice and elevation.  Patient would prefer to take extra strength Tylenol rather than an NSAID.  PROCEDURES  Procedures   ED DIAGNOSES     ICD-10-CM   1. Crush injury of foot, left, initial encounter  S97.82XA                     Abdulmalik Darco, Jenny Reichmann, MD 12/25/20 [id number]

## 2021-01-01 ENCOUNTER — Ambulatory Visit: Payer: Self-pay

## 2021-01-01 ENCOUNTER — Other Ambulatory Visit: Payer: Self-pay

## 2021-01-01 DIAGNOSIS — S99922A Unspecified injury of left foot, initial encounter: Secondary | ICD-10-CM | POA: Diagnosis not present

## 2021-01-01 DIAGNOSIS — W208XXA Other cause of strike by thrown, projected or falling object, initial encounter: Secondary | ICD-10-CM | POA: Insufficient documentation

## 2021-01-01 DIAGNOSIS — S9032XA Contusion of left foot, initial encounter: Secondary | ICD-10-CM | POA: Diagnosis not present

## 2021-01-01 DIAGNOSIS — Z8616 Personal history of COVID-19: Secondary | ICD-10-CM | POA: Diagnosis not present

## 2021-01-01 DIAGNOSIS — I10 Essential (primary) hypertension: Secondary | ICD-10-CM | POA: Diagnosis not present

## 2021-01-01 NOTE — Telephone Encounter (Signed)
Pt c/o severe pain to left foot on the top of the foot and side of foot. Pt c/o increased swelling and is concerned her foot is infected.  Pt is having to walk on it for ADL's and cooking supper and care of her child.  Pt stated her foot looks darker than the right foot.  Denies fever or calf pain or red streaks. Afebrile. Advised pt to go to UC to be evaluated. Pt stated she will go. Care advice given and pt verbalized understanding.     Reason for Disposition  [1] SEVERE pain (e.g., excruciating, unable to do any normal activities) AND [2] not improved after 2 hours of pain medicine  Answer Assessment - Initial Assessment Questions 1. ONSET: "When did the pain start?"      Yesterday worsened 2. LOCATION: "Where is the pain located?"      Left foot -top of foot and side of foot 3. PAIN: "How bad is the pain?"    (Scale 1-10; or mild, moderate, severe)  - MILD (1-3): doesn't interfere with normal activities.   - MODERATE (4-7): interferes with normal activities (e.g., work or school) or awakens from sleep, limping.   - SEVERE (8-10): excruciating pain, unable to do any normal activities, unable to walk.      severe 4. WORK OR EXERCISE: "Has there been any recent work or exercise that involved this part of the body?"      No not since injury- hurts to put down 5. CAUSE: "What do you think is causing the foot pain?"     Maybe having to walk on it 6. OTHER SYMPTOMS: "Do you have any other symptoms?" (e.g., leg pain, rash, fever, numbness)     Looks darker (redness), increased swelling,  7. PREGNANCY: "Is there any chance you are pregnant?" "When was your last menstrual period?"     N/a  Protocols used: Foot Pain-A-AH

## 2021-01-01 NOTE — ED Triage Notes (Signed)
  Patient comes in with left foot pain that has been going on for 1 week.  Patient dropped a case of water on her foot and was seen at [facility] on 10/7.  No fx seen and patient discharged with ortho shoe for comfort.  Patient endorses severe pain in left foot and states she needs blood work done.  Pain 9.5/10, stabbing pain in L foot.

## 2021-01-02 ENCOUNTER — Emergency Department (HOSPITAL_BASED_OUTPATIENT_CLINIC_OR_DEPARTMENT_OTHER): Payer: BC Managed Care – PPO

## 2021-01-02 ENCOUNTER — Encounter (HOSPITAL_BASED_OUTPATIENT_CLINIC_OR_DEPARTMENT_OTHER): Payer: Self-pay | Admitting: Emergency Medicine

## 2021-01-02 ENCOUNTER — Emergency Department (HOSPITAL_BASED_OUTPATIENT_CLINIC_OR_DEPARTMENT_OTHER)
Admission: EM | Admit: 2021-01-02 | Discharge: 2021-01-02 | Disposition: A | Discharge status: Home / Self Care | Payer: BC Managed Care – PPO | Attending: Emergency Medicine | Admitting: Emergency Medicine

## 2021-01-02 DIAGNOSIS — M79672 Pain in left foot: Secondary | ICD-10-CM | POA: Diagnosis not present

## 2021-01-02 DIAGNOSIS — S9032XD Contusion of left foot, subsequent encounter: Secondary | ICD-10-CM

## 2021-01-02 MED ORDER — ACETAMINOPHEN 325 MG PO TABS
650.0000 mg | ORAL_TABLET | Freq: Once | ORAL | Status: AC
  Administered 2021-01-02: 650 mg via ORAL
  Filled 2021-01-02: qty 2

## 2021-01-02 MED ORDER — NAPROXEN 250 MG PO TABS
500.0000 mg | ORAL_TABLET | Freq: Once | ORAL | Status: AC
  Administered 2021-01-02: 500 mg via ORAL
  Filled 2021-01-02: qty 2

## 2021-01-02 NOTE — ED Provider Notes (Signed)
[facility] EMERGENCY DEPT Provider Note   CSN: [id number] Arrival date & time: 01/01/21  2349     History Chief Complaint  Patient presents with   Foot Pain    Jenny Wright is a 55 y.o. female.  The history is provided by the patient.  Foot Pain She has history of factor V Leyden mutation and comes in because of ongoing pain and swelling in her left foot.  About 1 week ago, a table tipped over and fell on her left foot.  There is also a case of water on the table which fell on her foot.  She went to emergency where x-ray was read as negative and she was advised on ice and elevation and told to take acetaminophen for pain.  Foot remains swollen and is very painful to walk on.  She rates pain at 9/10.  She states that pain is worse now than when she was originally seen.   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 Last Assessment & Plan:  Discussed possible hematology consult in future for more definitive  dx.   Throat irritation 03/19/2019   Tinea pedis 08/29/2012   Uterine fibroid 02/13/2012   Uterine leiomyoma 02/13/2012    Patient Active Problem List   Diagnosis Date Noted   Dyspnea on exertion 10/16/2020   Essential hypertension 10/16/2020   Factor V Leiden mutation complicating pregnancy ([place]) 10/14/2020   Miscarriage 10/14/2020   Preeclampsia 10/14/2020   Acute bilateral low back pain without sciatica 10/01/2020   History of COVID-19 09/02/2020   Sinus congestion 09/02/2020   Laryngopharyngeal reflux 06/29/2020   Throat irritation 03/19/2019   Arthralgia of right temporomandibular joint 01/03/2017   Seasonal allergic rhinitis 01/03/2017   Bilateral impacted cerumen 09/29/2015   BPPV (benign paroxysmal positional vertigo) 09/29/2015   Sensorineural hearing loss (SNHL) of both ears 09/29/2015   DDD (degenerative disc disease), lumbar 10/07/2014   Menstrual migraine without status migrainosus, not intractable 03/12/2014   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     Family History  Problem Relation Age of Onset   Breast cancer Mother        deceased   Heart disease Father        deceased   Lung cancer Brother        deceased    Social History   Tobacco Use   Smoking status: Never   Smokeless tobacco: Never  Substance Use Topics   Alcohol use: No   Drug use: No    Home Medications Prior to Admission medications   Medication Sig Start Date End Date  Taking? Authorizing Provider  Cholecalciferol (VITAMIN D) 50 MCG (2000 UT) tablet Take 2,000 Units by mouth daily. 07/27/20   [provider]  [employer] VITAMIN C 1000 MG tablet Take 2,000 mg by mouth daily. 07/27/20   [provider]  Multiple Vitamin (MULTI-VITAMIN) tablet Take 1 tablet by mouth daily.    [provider]  zinc gluconate 50 MG tablet Take 50 mg by mouth daily. 07/27/20   [provider]    Allergies    Penicillins, Methylprednisolone, and Omeprazole  Review of Systems   Review of Systems  All other systems reviewed and are negative.  Physical Exam Updated Vital Signs BP (!) 142/95   Pulse (!) 54   Temp 98.4 F (36.9 C) (Oral)   Resp 18   Ht 5\' 7"  (1.702 m)   Wt 76.7 kg   SpO2 100%   BMI 26.47 kg/m   Physical Exam Vitals and nursing note reviewed.  55 year old female, resting comfortably and in no acute distress. Vital signs are significant for mildly elevated blood pressure, slightly slow heart rate. Oxygen saturation is 100%, which is normal. Head is normocephalic and atraumatic. PERRLA, EOMI. Oropharynx is clear. Neck is nontender and supple without adenopathy or JVD. Back is nontender and there is no CVA tenderness. Lungs are clear without rales, wheezes, or rhonchi. Chest is nontender. Heart has regular rate and rhythm without murmur. Abdomen is soft, flat, nontender. Extremities: There is soft tissue swelling and tenderness diffusely involving the left foot and ankle.  No calf swelling or tenderness. Skin is warm and dry without rash. Neurologic: Mental status is normal, cranial nerves are intact, there are no motor or sensory deficits.  ED Results / Procedures / Treatments    Radiology DG Foot Complete Left  Result Date: 01/02/2021 CLINICAL DATA:  LEFT foot pain status post trauma. EXAM: LEFT FOOT - COMPLETE 3+ VIEW COMPARISON:  None. FINDINGS: Osseous alignment is normal. No fracture line or displaced fracture fragment is  seen. No acute or suspicious cortical irregularity or osseous lesion. No significant degenerative change. Soft tissues about the foot are unremarkable. IMPRESSION: Negative. No fracture or dislocation seen. Electronically Signed   By: Franki Cabot M.D.   On: 01/02/2021 05:27    Procedures Procedures   Medications Ordered in ED Medications - No data to display  ED Course  I have reviewed the triage vital signs and the nursing notes.  Pertinent imaging results that were available during my care of the patient were reviewed by me and considered in my medical decision making (see chart for details).   MDM Rules/Calculators/A&P                         Contusion of the left foot with persistent pain and swelling.  Old records are reviewed showing ED visit on 10/7 for injury to left  foot at which time x-rays are read as negative for fracture.  Given continued pain and swelling, will repeat x-rays to make sure there was not an occult fracture.  X-rays are negative for fracture.  Patient is advised of this finding.  She is given crutches to use as needed.  She already has a postop shoe which she has been wearing.  She is advised to use over-the-counter NSAIDs plus acetaminophen as needed for pain.  She has an appointment with her podiatrist for an unrelated issue and is advised that the podiatrist can manage her ongoing care regarding her foot injury.  Final Clinical Impression(s) / ED Diagnoses Final diagnoses:  Contusion of left foot, subsequent encounter    Rx / DC Orders ED Discharge Orders     None        Delora Fuel, MD 92/01/00 [phone]

## 2021-01-02 NOTE — Discharge Instructions (Addendum)
Continue to use ice.  Apply ice for 30 minutes at a time, 4 times a day.  Take naproxen 2 tablets at a time, twice a day.  If you need additional pain relief, add acetaminophen 650 mg every 6 hours as needed.  Please be aware that combining acetaminophen and naproxen gives you better pain relief than either medication by itself.

## 2021-01-04 DIAGNOSIS — M79672 Pain in left foot: Secondary | ICD-10-CM | POA: Diagnosis not present

## 2021-01-04 DIAGNOSIS — B351 Tinea unguium: Secondary | ICD-10-CM | POA: Diagnosis not present

## 2021-01-04 DIAGNOSIS — S9032XA Contusion of left foot, initial encounter: Secondary | ICD-10-CM | POA: Diagnosis not present

## 2021-01-04 DIAGNOSIS — M79671 Pain in right foot: Secondary | ICD-10-CM | POA: Diagnosis not present

## 2021-01-08 DIAGNOSIS — I10 Essential (primary) hypertension: Secondary | ICD-10-CM | POA: Diagnosis not present

## 2021-01-11 DIAGNOSIS — R5383 Other fatigue: Secondary | ICD-10-CM | POA: Diagnosis not present

## 2021-01-11 DIAGNOSIS — Z0000 Encounter for general adult medical examination without abnormal findings: Secondary | ICD-10-CM | POA: Diagnosis not present

## 2021-01-11 DIAGNOSIS — I10 Essential (primary) hypertension: Secondary | ICD-10-CM | POA: Diagnosis not present

## 2021-01-11 DIAGNOSIS — E1165 Type 2 diabetes mellitus with hyperglycemia: Secondary | ICD-10-CM | POA: Diagnosis not present

## 2021-01-11 DIAGNOSIS — R030 Elevated blood-pressure reading, without diagnosis of hypertension: Secondary | ICD-10-CM | POA: Diagnosis not present

## 2021-01-11 DIAGNOSIS — Z1159 Encounter for screening for other viral diseases: Secondary | ICD-10-CM | POA: Diagnosis not present

## 2021-01-11 DIAGNOSIS — E559 Vitamin D deficiency, unspecified: Secondary | ICD-10-CM | POA: Diagnosis not present

## 2021-01-11 DIAGNOSIS — E785 Hyperlipidemia, unspecified: Secondary | ICD-10-CM | POA: Diagnosis not present

## 2021-01-11 DIAGNOSIS — Z6827 Body mass index (BMI) 27.0-27.9, adult: Secondary | ICD-10-CM | POA: Diagnosis not present

## 2021-01-11 DIAGNOSIS — Z114 Encounter for screening for human immunodeficiency virus [HIV]: Secondary | ICD-10-CM | POA: Diagnosis not present

## 2021-01-11 DIAGNOSIS — Z1339 Encounter for screening examination for other mental health and behavioral disorders: Secondary | ICD-10-CM | POA: Diagnosis not present

## 2021-01-21 DIAGNOSIS — M19072 Primary osteoarthritis, left ankle and foot: Secondary | ICD-10-CM | POA: Diagnosis not present

## 2021-01-21 DIAGNOSIS — M8430XA Stress fracture, unspecified site, initial encounter for fracture: Secondary | ICD-10-CM | POA: Insufficient documentation

## 2021-01-21 DIAGNOSIS — E1165 Type 2 diabetes mellitus with hyperglycemia: Secondary | ICD-10-CM | POA: Diagnosis not present

## 2021-01-24 ENCOUNTER — Other Ambulatory Visit: Payer: Self-pay

## 2021-01-24 ENCOUNTER — Encounter (HOSPITAL_BASED_OUTPATIENT_CLINIC_OR_DEPARTMENT_OTHER): Payer: Self-pay

## 2021-01-24 DIAGNOSIS — Z8616 Personal history of COVID-19: Secondary | ICD-10-CM | POA: Diagnosis not present

## 2021-01-24 DIAGNOSIS — K2100 Gastro-esophageal reflux disease with esophagitis, without bleeding: Secondary | ICD-10-CM | POA: Diagnosis not present

## 2021-01-24 DIAGNOSIS — R0989 Other specified symptoms and signs involving the circulatory and respiratory systems: Secondary | ICD-10-CM | POA: Insufficient documentation

## 2021-01-24 DIAGNOSIS — Z20822 Contact with and (suspected) exposure to covid-19: Secondary | ICD-10-CM | POA: Insufficient documentation

## 2021-01-24 DIAGNOSIS — I10 Essential (primary) hypertension: Secondary | ICD-10-CM | POA: Insufficient documentation

## 2021-01-24 DIAGNOSIS — F458 Other somatoform disorders: Secondary | ICD-10-CM | POA: Diagnosis not present

## 2021-01-24 DIAGNOSIS — R599 Enlarged lymph nodes, unspecified: Secondary | ICD-10-CM | POA: Diagnosis not present

## 2021-01-24 DIAGNOSIS — J029 Acute pharyngitis, unspecified: Secondary | ICD-10-CM | POA: Diagnosis not present

## 2021-01-24 NOTE — ED Triage Notes (Addendum)
After eating a chicken wing on Friday patient began to develop a sensation of a "burp not being able to come through" and states that, "sometimes the burp comes through and sometimes it doesn't". Pt was prescribed pantoprazole by PCP and was told to come to ED to be evaluated. Pt denies difficulty swallowing at this time or difficulty breathing, there is just some pain when swallowing. Pt states the problem is worse when trying to burp or when anything is trying to "come up".

## 2021-01-25 ENCOUNTER — Emergency Department (HOSPITAL_BASED_OUTPATIENT_CLINIC_OR_DEPARTMENT_OTHER)
Admission: EM | Admit: 2021-01-25 | Discharge: 2021-01-25 | Disposition: A | Discharge status: Home / Self Care | Payer: BC Managed Care – PPO | Attending: Emergency Medicine | Admitting: Emergency Medicine

## 2021-01-25 ENCOUNTER — Emergency Department (HOSPITAL_BASED_OUTPATIENT_CLINIC_OR_DEPARTMENT_OTHER): Payer: BC Managed Care – PPO

## 2021-01-25 DIAGNOSIS — R0989 Other specified symptoms and signs involving the circulatory and respiratory systems: Secondary | ICD-10-CM

## 2021-01-25 DIAGNOSIS — K2100 Gastro-esophageal reflux disease with esophagitis, without bleeding: Secondary | ICD-10-CM

## 2021-01-25 DIAGNOSIS — J984 Other disorders of lung: Secondary | ICD-10-CM | POA: Diagnosis not present

## 2021-01-25 LAB — GROUP A STREP BY PCR: Group A Strep by PCR: NOT DETECTED

## 2021-01-25 LAB — RESP PANEL BY RT-PCR (FLU A&B, COVID) ARPGX2
Influenza A by PCR: NEGATIVE
Influenza B by PCR: NEGATIVE
SARS Coronavirus 2 by RT PCR: NEGATIVE

## 2021-01-25 MED ORDER — ALUM & MAG HYDROXIDE-SIMETH 200-200-20 MG/5ML PO SUSP
30.0000 mL | Freq: Once | ORAL | Status: AC
  Administered 2021-01-25: 30 mL via ORAL
  Filled 2021-01-25: qty 30

## 2021-01-25 MED ORDER — LIDOCAINE VISCOUS HCL 2 % MT SOLN
15.0000 mL | Freq: Once | OROMUCOSAL | Status: DC
  Filled 2021-01-25: qty 15

## 2021-01-25 NOTE — ED Provider Notes (Signed)
[facility] EMERGENCY DEPT Provider Note   CSN: [id number] Arrival date & time: 01/24/21  2304     History Chief Complaint  Patient presents with   Gastroesophageal Reflux    Jenipher Havel is a 55 y.o. female.  HPI     This is a 55 year old female with a history of reflux, COVID-19 who presents with sore throat and difficulty swallowing.  Patient reports that she ate a chicken wing on Friday.  She developed a sensation that she needed to burp but had difficulty burping.  She states that fairly recently had endoscopy and was told that she had an irritated stomach.  She was taking pantoprazole.  She weaned off of this medicine.  She describes a persistent sensation of pain with swallowing.  She has been able to eat and drink since Friday including liquids and solids.  She states that the sensation has moved into her chest.  Denies significant burning but does describe a globus sensation and "narrowing."  Patient states that she feels like something is trying to "come up."  She also describes coughing up clear mucus.  Patient states that she called her GI doctor and she was referred to the emergency room.  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 Last Assessment & Plan:  Discussed possible hematology consult in future for more definitive dx.   Throat irritation 03/19/2019   Tinea pedis 08/29/2012   Uterine fibroid 02/13/2012   Uterine leiomyoma 02/13/2012    Patient Active Problem List   Diagnosis Date Noted   Dyspnea on exertion 10/16/2020   Essential hypertension 10/16/2020   Factor V Leiden mutation complicating pregnancy ([place]) 10/14/2020   Miscarriage 10/14/2020   Preeclampsia 10/14/2020   Acute bilateral low back pain without sciatica 10/01/2020   History of COVID-19 09/02/2020   Sinus congestion 09/02/2020   Laryngopharyngeal reflux 06/29/2020   Throat irritation 03/19/2019   Arthralgia of right temporomandibular joint 01/03/2017   Seasonal allergic rhinitis 01/03/2017   Bilateral impacted cerumen 09/29/2015   BPPV (benign paroxysmal positional vertigo) 09/29/2015   Sensorineural hearing loss (SNHL) of both ears 09/29/2015   DDD (degenerative disc disease), lumbar 10/07/2014   Menstrual migraine without status migrainosus, not intractable 03/12/2014   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     Family History  Problem Relation  Age of Onset   Breast cancer Mother        deceased   Heart disease Father        deceased   Lung cancer Brother        deceased    Social History   Tobacco Use   Smoking status: Never   Smokeless tobacco: Never  Substance Use Topics   Alcohol use: No   Drug use: No    Home Medications Prior to Admission medications   Medication Sig Start Date End Date Taking? Authorizing Provider  Cholecalciferol (VITAMIN D) 50 MCG (2000 UT) tablet Take 2,000 Units by mouth daily. 07/27/20   [provider]  [employer]S VITAMIN C 1000 MG tablet Take 2,000 mg by mouth daily. 07/27/20   [provider]  Multiple Vitamin (MULTI-VITAMIN) tablet Take 1 tablet by mouth daily.    [provider]  zinc gluconate 50 MG tablet Take 50 mg by mouth daily. 07/27/20   [provider]    Allergies    Penicillins, Methylprednisolone, and Omeprazole  Review of Systems   Review of Systems  Constitutional:  Negative for fever.  HENT:  Positive for sore throat and trouble swallowing. Negative for facial swelling.   Respiratory:  Positive for cough.   Cardiovascular:  Negative for chest pain.  Gastrointestinal:  Negative for abdominal pain, nausea and vomiting.  Genitourinary:  Negative for dysuria.  All other systems reviewed and are negative.  Physical Exam Updated Vital Signs BP (!) 157/95   Pulse 62   Temp 98.3 F (36.8 C) (Temporal)   Resp 12   Ht 1.702 m (5\' 7" )   Wt 75.8 kg   SpO2 100%   BMI 26.16 kg/m   Physical Exam Vitals and nursing note reviewed.  Constitutional:      Appearance: She is well-developed. She is not ill-appearing.  HENT:     Head: Normocephalic and atraumatic.     Nose: Nose normal. No congestion.     Mouth/Throat:     Mouth: Mucous membranes are moist.     Comments: Uvula midline, no significant uvular or tonsillar swelling, no erythema, no exudate, no stridor, normal phonation Eyes:     Pupils: Pupils are equal, round, and reactive to  light.  Cardiovascular:     Rate and Rhythm: Normal rate and regular rhythm.     Heart sounds: Normal heart sounds.  Pulmonary:     Effort: Pulmonary effort is normal. No respiratory distress.     Breath sounds: No wheezing.  Abdominal:     Palpations: Abdomen is soft.     Tenderness: There is no abdominal tenderness.  Musculoskeletal:     Cervical back: Neck supple.     Right lower leg: No edema.     Left lower leg: No edema.  Lymphadenopathy:     Cervical: Cervical adenopathy present.  Skin:    General: Skin is warm and dry.  Neurological:     Mental Status: She is alert and oriented to person, place, and time.  Psychiatric:        Mood and Affect: Mood normal.  ED Results / Procedures / Treatments   Labs (all labs ordered are listed, but only abnormal results are displayed) Labs Reviewed  GROUP A STREP BY PCR  RESP PANEL BY RT-PCR (FLU A&B, COVID) [id number]    EKG EKG Interpretation  Date/Time:  Monday January 25 2021 00:49:51 EST Ventricular Rate:  64 PR Interval:  139 QRS Duration: 92 QT Interval:  393 QTC Calculation: 406 R Axis:   71 Text Interpretation: Sinus rhythm Anteroseptal infarct, old Confirmed by Thayer Jew [phone]) on 01/25/2021 1:03:01 AM  Radiology DG Chest Portable 1 View  Result Date: 01/25/2021 CLINICAL DATA:  Chest discomfort after eating. EXAM: PORTABLE CHEST 1 VIEW COMPARISON:  Chest CT 11/13/2020, portable chest 08/23/2020 FINDINGS: The heart size and mediastinal contours are within normal limits. Both lungs are clear apart from chronic left lower lobe linear scarring. The visualized skeletal structures are intact with lower thoracic / lumbar levoscoliosis and a unilateral right-sided old spinal fusion rod in the lower thoracic spine extending inferiorly. IMPRESSION: No evidence of acute chest disease or interval changes. Electronically Signed   By: Telford Nab M.D.   On: 01/25/2021 01:18    Procedures Procedures   Medications  Ordered in ED Medications  alum & mag hydroxide-simeth (MAALOX/MYLANTA) 200-200-20 MG/5ML suspension 30 mL (30 mLs Oral Given 01/25/21 0102)    And  lidocaine (XYLOCAINE) 2 % viscous mouth solution 15 mL (15 mLs Oral Patient Refused/Not Given 01/25/21 0102)    ED Course  I have reviewed the triage vital signs and the nursing notes.  Pertinent labs & imaging results that were available during my care of the patient were reviewed by me and considered in my medical decision making (see chart for details).  Clinical Course as of 01/25/21 0253  Mon Jan 25, 2021  0130 Patient refused to take lidocaine with Maalox.  She reports no improvement with just Maalox.  She reports persistent globus sensation but is able to eat and drink and has been eating and drinking for the last 24 hours.  I discussed with her that I felt that outpatient GI follow-up was appropriate.  She should continue her pantoprazole and a reflux diet.  Patient is very concerned.  She is confused as to why she was referred to the ED if we could not scope her.  I discussed with her that this happens only in emergent situations with concerns for food bolus or stricture.  She is requesting COVID and strep testing.  She states that she "did not even have a temperature taken."  I have reviewed her vital signs.  Her temperature was recorded at 98.3 orally.  I have requested nursing to retake her temperature.  She continues to have normal phonation, no stridor, and no difficulty tolerating p.o. at bedside. [CH]  0202 I have reviewed notes from the patient's GI doc.  She called on Friday reporting globus sensation.  Recommended PPI unless concern for food impaction. [CH]    Clinical Course User Index [CH] Chanin Frumkin, Barbette Hair, MD   MDM Rules/Calculators/A&P                           Patient presents with persistent globus sensation since Friday.  She reinitiated her pantoprazole.  She is overall nontoxic and vital signs are notable for blood  pressure 159/80.  Patient does Describe the sensation going into her chest.  EKG shows no evidence of acute arrhythmic or ischemic changes.  History is highly suspicious  of GI etiology.  Doubt cardiovascular etiology.  Chest x-ray without pneumothorax or pneumonia.  No persistent or obvious foreign body.  Her oropharyngeal exam is without significant swelling or evidence of infection.  Patient declined full GI cocktail and had minimal improvement with just Maalox.  I highly suspect she may have some web or stricture given her history.  She is able to tolerate both solids and liquids I have a low suspicion for food impaction.  See clinical course above.  Infectious testing is negative.  I have referred her back to her GI doctor.  Recommend continuing pantoprazole.  After history, exam, and medical workup I feel the patient has been appropriately medically screened and is safe for discharge home. Pertinent diagnoses were discussed with the patient. Patient was given return precautions.  Final Clinical Impression(s) / ED Diagnoses Final diagnoses:  Globus sensation  Gastroesophageal reflux disease with esophagitis without hemorrhage    Rx / DC Orders ED Discharge Orders     None        Dina Rich, Barbette Hair, MD 01/25/21 [id number]

## 2021-01-25 NOTE — Discharge Instructions (Signed)
You were seen today with concerns for persistent sore throat and pain with swallowing.  Your work-up was reassuring including your EKG and chest x-ray.  I highly suspect you may have some worsening reflux.  Restart pantoprazole as instructed by your GI doctor.  You should follow a bland diet until follow-up.  Your strep screening is negative.  Your COVID test is pending.  If you develop worsening symptoms or feel like food is getting stuck, you need to follow-up more urgently for endoscopy.

## 2021-01-25 NOTE — ED Notes (Signed)
Opened chart at pts request to answer questions about test results.

## 2021-01-27 DIAGNOSIS — R0602 Shortness of breath: Secondary | ICD-10-CM | POA: Diagnosis not present

## 2021-01-27 DIAGNOSIS — R1310 Dysphagia, unspecified: Secondary | ICD-10-CM | POA: Diagnosis not present

## 2021-01-27 DIAGNOSIS — R7303 Prediabetes: Secondary | ICD-10-CM | POA: Diagnosis not present

## 2021-01-27 DIAGNOSIS — Z6826 Body mass index (BMI) 26.0-26.9, adult: Secondary | ICD-10-CM | POA: Diagnosis not present

## 2021-01-27 DIAGNOSIS — I10 Essential (primary) hypertension: Secondary | ICD-10-CM | POA: Diagnosis not present

## 2021-01-27 DIAGNOSIS — E785 Hyperlipidemia, unspecified: Secondary | ICD-10-CM | POA: Diagnosis not present

## 2021-02-01 ENCOUNTER — Other Ambulatory Visit: Payer: Self-pay | Admitting: *Deleted

## 2021-02-01 ENCOUNTER — Telehealth: Payer: Self-pay | Admitting: Interventional Cardiology

## 2021-02-01 NOTE — Telephone Encounter (Signed)
Spoke with pt and she wanted to call and get our thoughts on what to do about her BP.  Below are the most recent readings.  See Jenny Montana, NP on 10/4 for HTN.  BP was 116/72, HR 52.  Metoprolol was stopped at that time.  Pt with intermittent HA and thought it might be related to coming off of the Metoprolol and then getting back on it.  She restarted the Metoprolol 2 days after stopping it.  SBP was 140 and she called the on call and they told her if it was that high then she needed to start retaking it. States she was seen by PCP recently and BP was elevated and they told her to start HCTZ 25mg  once daily but she hasn't started it because she wanted to talk to our office first.  Denies any symptoms outside of the HA.  Did injure her foot on 10/15 and was taking Ibuprofen for a little bit to help with that.  We discussed that NSAIDs can elevate BP.  Questioned her about seeing a Film/video editor at [employer] and she said that MD only advised that she return to her PCP to discuss HTN because he doesn't use Metoprolol for that, only for HR issues.  Advised I will send message to Jenny Montana, NP to advise since she seen pt most recently and Dr. Tamala Julian has never seen her before.  Pt appreciative for call and assistance.

## 2021-02-01 NOTE — Telephone Encounter (Signed)
Pt c/o BP issue: STAT if pt c/o blurred vision, one-sided weakness or slurred speech  1. What are your last 5 BP readings?  01/22/21: 134/71 HR 59 01/23/21: 175/93 HR 57 01/24/21: 157/92 HR 62 01/25/21: 138/79 HR 65 01/26/21: 140/82 HR 70 01/27/21: 107/65 HR 57 01/28/21: 112/69 HR 53 01/30/21: 114/77 HR 55 01/31/21: 133/70 HR 51  2. Are you having any other symptoms (ex. Dizziness, headache, blurred vision, passed out)? headache  3. What is your BP issue?  These BP readings are after the patient started taking her metoprolol again. She was told at her visit with the NP to stop taking it.   The patient wanted to know if she needs to be on a different medication. She is changing her diet and hopes to start exercising in two weeks or so because she is in a walking boot

## 2021-02-02 NOTE — Telephone Encounter (Signed)
Recommend checking blood pressure about the same time each day at least 2 hours after medications.   Is she still taking the Metoprolol? As she and I discussed in clinic Metoprolol is not a very effective blood pressure medication. More helpful with heart rate. Given that she has some elevated blood pressure, would agree with recommendation for HCTZ 25mg  daily as provided by primary care provider.   Loel Dubonnet, NP

## 2021-02-02 NOTE — Telephone Encounter (Signed)
Discussed with Laurann Montana, NP and made her aware that pt is still taking the Metoprolol.  She agreed that pt should stop the Metoprolol and start the HCTZ 25mg  QD that was prescribed.   Called pt and left message to call back.

## 2021-02-03 NOTE — Telephone Encounter (Signed)
Left message to call back  

## 2021-02-04 NOTE — Telephone Encounter (Signed)
Late entry:  Spoke with pt 11/16 and reviewed recommendations.  Pt agreeable to plan.

## 2021-02-10 ENCOUNTER — Telehealth: Payer: Self-pay | Admitting: Family

## 2021-02-10 ENCOUNTER — Telehealth: Payer: Self-pay | Admitting: Internal Medicine

## 2021-02-10 NOTE — Telephone Encounter (Signed)
Nasal congestion not a side effect of HCTZ. BP readings look good and are at goal of <130/80. Recommend continuing current anti hypertensive regimen. Recommend she contact her PCP regarding nasal congestion. Would be safe to use OTC cetirizine (Zyrtec) or loratidine (Claritin) with Flonase nasal spray for improvement in nasal congestion.   Chest pain atypical for angina. Glad it has improved!  Laurann Montana, NP

## 2021-02-10 NOTE — Telephone Encounter (Signed)
Spoke with pt and has noted for a couple of days nasal congestion feeling without the mucous steam does help some but feels like there is congestion Also pt had middle chest pain yesterday that radiated some to the left side O2 was 99% and here are some B/P readings since medication change one reading at 91/70 and other readings ranging 101-125/68-84  Pt has concerns re recent med change and if these are side effects Will forward to Laurann Montana NP for review and recommendations

## 2021-02-10 NOTE — Telephone Encounter (Signed)
Patient feels like her nose has been stopped/stuffed up since starting HCTZ. Her symptoms starting last evening. Her O2 sats are normal, but she still feels restricted from a breathing standpoint. Instructed her to hold her HCTZ and discuss in the AM with the prescribing physician.

## 2021-02-10 NOTE — Telephone Encounter (Signed)
Pt c/o of Chest Pain: STAT if CP now or developed within 24 hours  1. Are you having CP right now? CRAMPING PAIN RIGHT ABOVE HER HEART  NOT CHEST PAIN  2. Are you experiencing any other symptoms (ex. SOB, nausea, vomiting, sweating)? UNABLE TO BREATHE OUT OF HER NOSE, STARTED A FEW DAYS AGO. TODAY SHE IS RELIEVED  3. How long have you been experiencing CP? YESTERDAY  4. Is your CP continuous or coming and going? COMING AND GOING  5. Have you taken Nitroglycerin? PT DOES NOT HAVE NITRO ? PT SPOKE WITH OUR AFTER HOURS OPERATOR LAST NIGHT AND WAS TOLD TO CONTACT HER PCP

## 2021-02-15 NOTE — Telephone Encounter (Signed)
Called patient no answer.Unable to leave a message voice mail full.

## 2021-02-16 NOTE — Telephone Encounter (Signed)
Called patient no answer.Unable to leave a message voice mail full.

## 2021-02-23 NOTE — Telephone Encounter (Signed)
Spoke to patient Laurann Montana PA advice given.

## 2021-03-08 ENCOUNTER — Other Ambulatory Visit: Payer: Self-pay | Admitting: Unknown Physician Specialty

## 2021-03-08 DIAGNOSIS — Z1231 Encounter for screening mammogram for malignant neoplasm of breast: Secondary | ICD-10-CM

## 2021-03-12 ENCOUNTER — Telehealth: Payer: Self-pay | Admitting: Family

## 2021-03-12 NOTE — Telephone Encounter (Signed)
Pt c/o BP issue: STAT if pt c/o blurred vision, one-sided weakness or slurred speech  1. What are your last 5 BP readings? 121/80 HR 76  2. Are you having any other symptoms (ex. Dizziness, headache, blurred vision, passed out)? Had headache this morning, is feeling lightheaded and feeling weak.   3. What is your BP issue? Low BP

## 2021-03-12 NOTE — Telephone Encounter (Signed)
Spoke to pt. She report waking up feeling very lightheaded and weak and was concerned it could be related to her BP. BP 121/80 HR 76. Pt also state she recently changed her diet and has only been eating salads and healthy food.   Rushie Goltz, NP made and recommended to try to eat something and stay well hydrated. Pt verbalized understanding. Pt will also reach out to pcp for a well balance meal plan.

## 2021-03-23 DIAGNOSIS — Z1231 Encounter for screening mammogram for malignant neoplasm of breast: Secondary | ICD-10-CM | POA: Diagnosis not present

## 2021-04-12 DIAGNOSIS — J310 Chronic rhinitis: Secondary | ICD-10-CM | POA: Diagnosis not present

## 2021-04-12 DIAGNOSIS — K219 Gastro-esophageal reflux disease without esophagitis: Secondary | ICD-10-CM | POA: Diagnosis not present

## 2021-04-18 DIAGNOSIS — M545 Low back pain, unspecified: Secondary | ICD-10-CM | POA: Diagnosis not present

## 2021-04-18 DIAGNOSIS — R8290 Unspecified abnormal findings in urine: Secondary | ICD-10-CM | POA: Diagnosis not present

## 2021-04-23 ENCOUNTER — Ambulatory Visit: Payer: BC Managed Care – PPO | Admitting: Interventional Cardiology

## 2021-04-28 DIAGNOSIS — B351 Tinea unguium: Secondary | ICD-10-CM | POA: Diagnosis not present

## 2021-04-28 DIAGNOSIS — M79671 Pain in right foot: Secondary | ICD-10-CM | POA: Diagnosis not present

## 2021-04-28 DIAGNOSIS — M79672 Pain in left foot: Secondary | ICD-10-CM | POA: Diagnosis not present

## 2021-05-16 ENCOUNTER — Telehealth: Payer: Self-pay | Admitting: Cardiology

## 2021-05-16 ENCOUNTER — Other Ambulatory Visit: Payer: Self-pay | Admitting: Cardiology

## 2021-05-16 MED ORDER — HYDROCHLOROTHIAZIDE 25 MG PO TABS: 25.0000 mg | ORAL_TABLET | Freq: Every day | ORAL | 0 refills | Status: DC

## 2021-05-16 NOTE — Telephone Encounter (Signed)
Pt called in reporting she is out of her HCTZ 25mg  daily. Has a visit on 3/13 with Dr. Tamala Julian but no refills until that time. I instructed I would send a 30d fill to her pharmacy to get her to her follow up visit. Adjustments may be made then. She does not check her BP, advised she should be a log to bring to follow up visit to determine if changes need to be made.

## 2021-05-24 ENCOUNTER — Telehealth: Payer: Self-pay | Admitting: Student in an Organized Health Care Education/Training Program

## 2021-05-24 NOTE — Telephone Encounter (Signed)
Patient called to report a constellation of symptoms. She reports having "tingling" in her left foot that "jumps around" to different parts of her body including her chest. Denies chest or leg pain. Denies SOB and palpitations. No alarming symptoms really. I counseled the patient to present to the ED if she develops chest pain, leg pain or coolness, acute onset SOB or tachycardia. I further told her to speak to her PCP in the AM. She verbalized an understanding.  ?

## 2021-05-25 DIAGNOSIS — G5792 Unspecified mononeuropathy of left lower limb: Secondary | ICD-10-CM | POA: Diagnosis not present

## 2021-05-25 DIAGNOSIS — M8430XA Stress fracture, unspecified site, initial encounter for fracture: Secondary | ICD-10-CM | POA: Diagnosis not present

## 2021-05-25 DIAGNOSIS — E1100 Type 2 diabetes mellitus with hyperosmolarity without nonketotic hyperglycemic-hyperosmolar coma (NKHHC): Secondary | ICD-10-CM | POA: Diagnosis not present

## 2021-05-25 DIAGNOSIS — E1165 Type 2 diabetes mellitus with hyperglycemia: Secondary | ICD-10-CM | POA: Diagnosis not present

## 2021-05-30 NOTE — Progress Notes (Signed)
Cardiology Office Note:    Date:  05/31/2021   ID:  Jenny Wright, DOB 1966-03-21, MRN [id number]  PCP:  Merryl Hacker, No  Cardiologist:  Sinclair Grooms, MD   Referring MD: Secundino Ginger, PA-C   Chief Complaint  Patient presents with   Hypertension   Advice Only    Concerns about prior dyspnea Concerns about risk factors for future cardiac events    History of Present Illness:    Jenny Wright is a 56 y.o. female with a hx of labile hypertension, COVID19 07/2020, Factor V Leiden mutation complicating pregnancy as well as preeclampsia, mild overweight, and DOE.   She is strongly lobbyist for a visit.  She was seen Dr. Dr. Geraldo Pitter.  Her risk factors are noted above.  She has no real cardiopulmonary symptoms but as the aftermath of COVID had dyspnea on exertion and was concerned about an underlying heart problem.  Her blood pressures have been labile over time.  She has had some trouble with mild overweight.  Her mother died of breast cancer.  Her father had an irregular heartbeat.  She denies orthopnea, PND, dyspnea on exertion (which got better after discontinuation of metoprolol), palpitations, syncope, and edema.  Risk factors for cardiovascular disease include preeclampsia..  I do not have any substantiation of her current lipid and hemoglobin A1c values.  Coronary calcium score is 0.  Blood pressure has been on excellent control on HCTZ 25 mg/day.  Past Medical History:  Diagnosis Date   Acute bilateral low back pain without sciatica 10/01/2020   Anemia    Antiphospholipid antibody positive 02/13/2012   Arthralgia of right temporomandibular joint 01/03/2017   Bell's palsy    Bilateral impacted cerumen 09/29/2015   BPPV (benign paroxysmal positional vertigo) 09/29/2015   DDD (degenerative disc disease), lumbar 10/07/2014   Factor V Leiden mutation complicating pregnancy ([place])    Herpes simplex type 2 infection 02/01/2013   History of COVID-19 09/02/2020    Laryngopharyngeal reflux 06/29/2020   Menstrual migraine without status migrainosus, not intractable 03/12/2014   Miscarriage    Onychomycosis 08/29/2012   Preeclampsia    pregnancy   Rash and nonspecific skin eruption 08/29/2012   Scoliosis    Seasonal allergic rhinitis 01/03/2017   Sensorineural hearing loss (SNHL) of both ears 09/29/2015   Sinus congestion 09/02/2020   Systemic lupus erythematosus (SLE) inhibitor ([place]) 02/13/2012   Formatting of this note might be different from the original. Last Assessment & Plan:  Discussed possible hematology consult in future for more definitive dx.   Throat irritation 03/19/2019   Tinea pedis 08/29/2012   Uterine fibroid 02/13/2012   Uterine leiomyoma 02/13/2012    Past Surgical History:  Procedure Laterality Date   BACK SURGERY     CESAREAN SECTION     harrington rod  1980    Current Medications: Current Meds  Medication Sig   Cholecalciferol (VITAMIN D) 50 MCG (2000 UT) tablet Take 2,000 Units by mouth daily.   CVS VITAMIN C 1000 MG tablet Take 2,000 mg by mouth daily.   Ferrous Sulfate (IRON PO) Take by mouth daily.   hydrochlorothiazide (MICROZIDE) 12.5 MG capsule Take 1 capsule (12.5 mg total) by mouth daily.   Multiple Vitamin (MULTI-VITAMIN) tablet Take 1 tablet by mouth daily.   zinc gluconate 50 MG tablet Take 50 mg by mouth daily.   [DISCONTINUED] hydrochlorothiazide (HYDRODIURIL) 25 MG tablet Take 1 tablet (25 mg total) by mouth daily.     Allergies:  Penicillins, Methylprednisolone, and Omeprazole   Social History   Socioeconomic History   Marital status: Divorced    Spouse name: Not on file   Number of children: Not on file   Years of education: Not on file   Highest education level: Not on file  Occupational History   Not on file  Tobacco Use   Smoking status: Never   Smokeless tobacco: Never  Substance and Sexual Activity   Alcohol use: No   Drug use: No   Sexual activity: Not Currently    Birth  control/protection: None  Other Topics Concern   Not on file  Social History Narrative   Not on file   Social Determinants of Health   Financial Resource Strain: Not on file  Food Insecurity: Not on file  Transportation Needs: Not on file  Physical Activity: Not on file  Stress: Not on file  Social Connections: Not on file     Family History: The patient's family history includes Breast cancer in her mother; Heart disease in her father; Lung cancer in her brother.  ROS:   Please see the history of present illness.    Multiple concerns and complaints.  She feels she has acid reflux.  Had chest tightness during COVID.  This is subsequently resolved.  She also equates discontinuing metoprolol with improvement.  All other systems reviewed and are negative.  EKGs/Labs/Other Studies Reviewed:    The following studies were reviewed today:  CORONARY CT Calcium Score 11/13/2020: IMPRESSION: Coronary calcium score of 0. This was 0 percentile for age-, race-, and sex-matched controls.  ECHOCARDIOGRAM 11/02/2020:  Normal EF LVH No mention of diastolic assessment  EKG:  EKG January 25, 2021 demonstrated normal sinus rhythm, low voltage, poor R wave progression probably related to lead placement.  No acute findings.  Recent Labs: 08/23/2020: ALT 19; BUN 11; Creatinine, Ser 0.80; Hemoglobin 12.8; Platelets 216; Potassium 3.6; Sodium 138  Recent Lipid Panel No results found for: CHOL, TRIG, HDL, CHOLHDL, VLDL, LDLCALC, LDLDIRECT  Physical Exam:    VS:  BP (!) 100/58    Pulse 74    Ht '5\' 7"'$  (1.702 m)    Wt 153 lb 3.2 oz (69.5 kg)    SpO2 98%    BMI 23.99 kg/m     Wt Readings from Last 3 Encounters:  05/31/21 153 lb 3.2 oz (69.5 kg)  01/24/21 167 lb (75.8 kg)  01/02/21 169 lb (76.7 kg)     GEN: Normal appearing. No acute distress HEENT: Normal NECK: No JVD. LYMPHATICS: No lymphadenopathy CARDIAC: No murmur. RRR no gallop, or edema. VASCULAR:  Normal Pulses. No  bruits. RESPIRATORY:  Clear to auscultation without rales, wheezing or rhonchi  ABDOMEN: Soft, non-tender, non-distended, No pulsatile mass, MUSCULOSKELETAL: No deformity  SKIN: Warm and dry NEUROLOGIC:  Alert and oriented x 3 PSYCHIATRIC:  Normal affect   ASSESSMENT:    1. Essential hypertension   2. Dyspnea on exertion   3. Factor V Leiden mutation complicating pregnancy ([place])    PLAN:    In order of problems listed above:  Blood pressure is well controlled on a relatively high dose of HCTZ.  Decrease to 12.5 mg daily.  1 month follow-up in the hypertension clinic.  Elevated blood pressure and preeclampsia during pregnancy are CV risk factors.  We need to get information on other solid risk factors such as hemoglobin A1c and lipids.  She will provide that data for [place]. Dyspnea on exertion has resolved.  She is not  vaccinated for COVID-19. Did not discuss.   Medication Adjustments/Labs and Tests Ordered: Current medicines are reviewed at length with the patient today.  Concerns regarding medicines are outlined above.  Orders Placed This Encounter  Procedures   AMB Referral to [facility]   Meds ordered this encounter  Medications   hydrochlorothiazide (MICROZIDE) 12.5 MG capsule    Sig: Take 1 capsule (12.5 mg total) by mouth daily.    Dispense:  90 capsule    Refill:  3    Dose change    Patient Instructions  Medication Instructions:  1) DECREASE Hydrochlorothiazide to 12.'5mg'$  once daily  *If you need a refill on your cardiac medications before your next appointment, please call your pharmacy*   Lab Work: None If you have labs (blood work) drawn today and your tests are completely normal, you will receive your results only by: [place] (if you have MyChart) OR A paper copy in the mail If you have any lab test that is abnormal or we need to change your treatment, we will call you to review the  results.   Testing/Procedures: None   Follow-Up:  Your physician recommends that you schedule a follow-up appointment in: 1 month with the Hypertension Clinic.   At [facility], you and your health needs are our priority.  As part of our continuing mission to provide you with exceptional heart care, we have created designated Provider Care Teams.  These Care Teams include your primary Cardiologist (physician) and Advanced Practice Providers (APPs -  Physician Assistants and Nurse Practitioners) who all work together to provide you with the care you need, when you need it.  We recommend signing up for the patient portal called "MyChart".  Sign up information is provided on this After Visit Summary.  MyChart is used to connect with patients for Virtual Visits (Telemedicine).  Patients are able to view lab/test results, encounter notes, upcoming appointments, etc.  Non-urgent messages can be sent to your provider as well.   To learn more about what you can do with MyChart, go to [email or web address].    Your next appointment:   1 year(s)  The format for your next appointment:   In Person  Provider:   Sinclair Grooms, MD        Signed, Sinclair Grooms, MD  05/31/2021 9:41 AM    [place]

## 2021-05-31 ENCOUNTER — Ambulatory Visit: Payer: BC Managed Care – PPO | Admitting: Interventional Cardiology

## 2021-05-31 ENCOUNTER — Other Ambulatory Visit: Payer: Self-pay

## 2021-05-31 ENCOUNTER — Encounter: Payer: Self-pay | Admitting: Interventional Cardiology

## 2021-05-31 VITALS — BP 100/58 | HR 74 | Ht 67.0 in | Wt 153.2 lb

## 2021-05-31 DIAGNOSIS — I10 Essential (primary) hypertension: Secondary | ICD-10-CM

## 2021-05-31 DIAGNOSIS — R0609 Other forms of dyspnea: Secondary | ICD-10-CM | POA: Diagnosis not present

## 2021-05-31 DIAGNOSIS — O99119 Other diseases of the blood and blood-forming organs and certain disorders involving the immune mechanism complicating pregnancy, unspecified trimester: Secondary | ICD-10-CM | POA: Diagnosis not present

## 2021-05-31 DIAGNOSIS — D6851 Activated protein C resistance: Secondary | ICD-10-CM | POA: Diagnosis not present

## 2021-05-31 MED ORDER — HYDROCHLOROTHIAZIDE 12.5 MG PO CAPS: 12.5000 mg | ORAL_CAPSULE | Freq: Every day | ORAL | 3 refills | Status: DC

## 2021-05-31 NOTE — Patient Instructions (Signed)
Medication Instructions:  ?1) DECREASE Hydrochlorothiazide to 12.'5mg'$  once daily ? ?*If you need a refill on your cardiac medications before your next appointment, please call your pharmacy* ? ? ?Lab Work: ?None ?If you have labs (blood work) drawn today and your tests are completely normal, you will receive your results only by: ?MyChart Message (if you have MyChart) OR ?A paper copy in the mail ?If you have any lab test that is abnormal or we need to change your treatment, we will call you to review the results. ? ? ?Testing/Procedures: ?None ? ? ?Follow-Up: ? ?Your physician recommends that you schedule a follow-up appointment in: 1 month with the Hypertension Clinic. ? ? ?At [facility], you and your health needs are our priority.  As part of our continuing mission to provide you with exceptional heart care, we have created designated Provider Care Teams.  These Care Teams include your primary Cardiologist (physician) and Advanced Practice Providers (APPs -  Physician Assistants and Nurse Practitioners) who all work together to provide you with the care you need, when you need it. ? ?We recommend signing up for the patient portal called "MyChart".  Sign up information is provided on this After Visit Summary.  MyChart is used to connect with patients for Virtual Visits (Telemedicine).  Patients are able to view lab/test results, encounter notes, upcoming appointments, etc.  Non-urgent messages can be sent to your provider as well.   ?To learn more about what you can do with MyChart, go to [email or web address].   ? ?Your next appointment:   ?1 year(s) ? ?The format for your next appointment:   ?In Person ? ?Provider:   ?Sinclair Grooms, MD   ? ?  ?

## 2021-06-08 ENCOUNTER — Encounter: Payer: Self-pay | Admitting: Family Medicine

## 2021-06-08 ENCOUNTER — Ambulatory Visit: Payer: BC Managed Care – PPO | Admitting: Family Medicine

## 2021-06-08 VITALS — BP 115/68 | HR 73 | Temp 97.9°F | Ht 67.0 in | Wt 155.0 lb

## 2021-06-08 DIAGNOSIS — I998 Other disorder of circulatory system: Secondary | ICD-10-CM

## 2021-06-08 DIAGNOSIS — R7303 Prediabetes: Secondary | ICD-10-CM | POA: Diagnosis not present

## 2021-06-08 DIAGNOSIS — Z0000 Encounter for general adult medical examination without abnormal findings: Secondary | ICD-10-CM

## 2021-06-08 NOTE — Addendum Note (Signed)
Addended by: Lynnea Ferrier on: 06/08/2021 04:23 PM ? ? Modules accepted: Orders ? ?

## 2021-06-08 NOTE — Progress Notes (Signed)
? ?New Patient Office Visit ? ?Subjective:  ?Patient ID: Jenny Wright, female    DOB: [birth year]  Age: 56 y.o. MRN: [id number] ? ?CC:  ?Chief Complaint  ?Patient presents with  ? Establish Care  ?  NP/establish care no concerns.   ? ? ?HPI ?Jenny Wright presents for establishment of care.  She has a history of fluctuating blood pressure and is currently followed by cardiology.  She is taking low-dose HCTZ without issue.  History of infertility and positive for factor V Leiden mutation.  Last pregnancy with a successful outcome was treated low weight heparin throughout the gestation.  She does not have a known history of lupus as the chart indicates.  History of prediabetes.  Pregnancy without diabetes.  Female care is through GYN.  She does not smoke, drink alcohol or use illicit drugs. ? ?Past Medical History:  ?Diagnosis Date  ? Acute bilateral low back pain without sciatica 10/01/2020  ? Anemia   ? Antiphospholipid antibody positive 02/13/2012  ? Arthralgia of right temporomandibular joint 01/03/2017  ? Bell's palsy   ? Bilateral impacted cerumen 09/29/2015  ? BPPV (benign paroxysmal positional vertigo) 09/29/2015  ? DDD (degenerative disc disease), lumbar 10/07/2014  ? Factor V Leiden mutation complicating pregnancy ([place])   ? 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 History of COVID-19 09/02/2020  ? Laryngopharyngeal reflux 06/29/2020  ? Menstrual migraine without status migrainosus, not intractable 03/12/2014  ? Miscarriage   ? Onychomycosis 08/29/2012  ? Preeclampsia   ? pregnancy  ? Rash and nonspecific skin eruption 08/29/2012  ? Scoliosis   ? Seasonal allergic rhinitis 01/03/2017  ? Sensorineural hearing loss (SNHL) of both ears 09/29/2015  ? Sinus congestion 09/02/2020  ? Systemic lupus erythematosus (SLE) inhibitor ([place]) 02/13/2012  ? Formatting of this note might be different from the original. Last Assessment & Plan:  Discussed possible hematology consult in future for more definitive dx.  ?  Throat irritation 03/19/2019  ? Tinea pedis 08/29/2012  ? Uterine fibroid 02/13/2012  ? Uterine leiomyoma 02/13/2012  ? ? ?Past Surgical History:  ?Procedure Laterality Date  ? BACK SURGERY    ? CESAREAN SECTION    ? harrington rod  1980  ? ? ?Family History  ?Problem Relation Age of Onset  ? Breast cancer Mother   ?     deceased  ? Heart disease Father   ?     deceased  ? Lung cancer Brother   ?     deceased  ? ? ?Social History  ? ?Socioeconomic History  ? Marital status: Divorced  ?  Spouse name: Not on file  ? Number of children: Not on file  ? Years of education: Not on file  ? Highest education level: Not on file  ?Occupational History  ? Not on file  ?Tobacco Use  ? Smoking status: Never  ? Smokeless tobacco: Never  ?Vaping Use  ? Vaping Use: Never used  ?Substance and Sexual Activity  ? Alcohol use: No  ? Drug use: No  ? Sexual activity: Not Currently  ?  Birth control/protection: None  ?Other Topics Concern  ? Not on file  ?Social History Narrative  ? Not on file  ? ?Social Determinants of Health  ? ?Financial Resource Strain: Not on file  ?Food Insecurity: Not on file  ?Transportation Needs: Not on file  ?Physical Activity: Not on file  ?Stress: Not on file  ?Social Connections: Not on file  ?Intimate Partner Violence: Not on  file  ? ? ?ROS ?Review of Systems  ?Constitutional: Negative.   ?HENT: Negative.    ?Eyes:  Negative for photophobia and visual disturbance.  ?Respiratory: Negative.    ?Cardiovascular: Negative.   ?Gastrointestinal: Negative.   ?Genitourinary: Negative.   ?Musculoskeletal:  Negative for gait problem and joint swelling.  ?Neurological:  Negative for speech difficulty and weakness.  ? ?Objective:  ? ?Today's Vitals: BP 115/68 (BP Location: Right Arm, Patient Position: Sitting, Cuff Size: Normal)   Pulse 73   Temp 97.9 ?F (36.6 ?C) (Temporal)   Ht '5\' 7"'$  (1.702 m)   Wt 155 lb (70.3 kg)   SpO2 98%   BMI 24.28 kg/m?  ? ?Physical Exam ?Vitals and nursing note reviewed.   ?Constitutional:   ?   General: She is not in acute distress. ?   Appearance: Normal appearance. She is not ill-appearing, toxic-appearing or diaphoretic.  ?HENT:  ?   Head: Normocephalic and atraumatic.  ?   Right Ear: External ear normal.  ?   Left Ear: External ear normal.  ?Eyes:  ?   General: No scleral icterus.    ?   Right eye: No discharge.     ?   Left eye: No discharge.  ?   Extraocular Movements: Extraocular movements intact.  ?   Conjunctiva/sclera: Conjunctivae normal.  ?Cardiovascular:  ?   Rate and Rhythm: Normal rate and regular rhythm.  ?Pulmonary:  ?   Effort: Pulmonary effort is normal.  ?   Breath sounds: Normal breath sounds.  ?Skin: ?   General: Skin is warm and dry.  ?Neurological:  ?   Mental Status: She is alert and oriented to person, place, and time.  ?Psychiatric:     ?   Mood and Affect: Mood normal.     ?   Behavior: Behavior normal.  ? ? ?Assessment & Plan:  ? ?Problem List Items Addressed This Visit   ? ?  ? Other  ? Fluctuating blood pressure - Primary  ? Relevant Orders  ? CBC  ? Basic metabolic panel  ? Urinalysis, Routine w reflex microscopic  ? Microalbumin / creatinine urine ratio  ? Prediabetes  ? Relevant Orders  ? CBC  ? Basic metabolic panel  ? Hemoglobin A1c  ? Urinalysis, Routine w reflex microscopic  ? Microalbumin / creatinine urine ratio  ? ?Other Visit Diagnoses   ? ? Healthcare maintenance      ? Relevant Orders  ? LDL cholesterol, direct  ? ?  ? ? ?Outpatient Encounter Medications as of 06/08/2021  ?Medication Sig  ? Cholecalciferol (VITAMIN D) 50 MCG (2000 UT) tablet Take 2,000 Units by mouth daily.  ? CVS VITAMIN C 1000 MG tablet Take 2,000 mg by mouth daily.  ? Ferrous Sulfate (IRON PO) Take by mouth daily.  ? hydrochlorothiazide (MICROZIDE) 12.5 MG capsule Take 1 capsule (12.5 mg total) by mouth daily.  ? Multiple Vitamin (MULTI-VITAMIN) tablet Take 1 tablet by mouth daily.  ? zinc gluconate 50 MG tablet Take 50 mg by mouth daily.  ? ?No facility-administered  encounter medications on file as of 06/08/2021.  ? ? ?Follow-up: Return in about 6 months (around 12/09/2021).  Baseline labs checked today.  Follow-up in 6 months.  Continue follow-up with cardiology and GYN. ? ?Libby Maw, MD ? ?

## 2021-06-10 ENCOUNTER — Other Ambulatory Visit (INDEPENDENT_AMBULATORY_CARE_PROVIDER_SITE_OTHER): Payer: BC Managed Care – PPO

## 2021-06-10 DIAGNOSIS — Z0000 Encounter for general adult medical examination without abnormal findings: Secondary | ICD-10-CM | POA: Diagnosis not present

## 2021-06-10 DIAGNOSIS — R7303 Prediabetes: Secondary | ICD-10-CM

## 2021-06-10 DIAGNOSIS — I998 Other disorder of circulatory system: Secondary | ICD-10-CM | POA: Diagnosis not present

## 2021-06-10 LAB — URINALYSIS, ROUTINE W REFLEX MICROSCOPIC
Bilirubin Urine: NEGATIVE
Hgb urine dipstick: NEGATIVE
Ketones, ur: NEGATIVE
Nitrite: NEGATIVE
Specific Gravity, Urine: 1.025 (ref 1.000–1.030)
Total Protein, Urine: NEGATIVE
Urine Glucose: NEGATIVE
Urobilinogen, UA: 0.2 (ref 0.0–1.0)
pH: 6 (ref 5.0–8.0)

## 2021-06-10 LAB — MICROALBUMIN / CREATININE URINE RATIO
Creatinine,U: 178.4 mg/dL
Microalb Creat Ratio: 0.8 mg/g (ref 0.0–30.0)
Microalb, Ur: 1.5 mg/dL (ref 0.0–1.9)

## 2021-06-10 LAB — BASIC METABOLIC PANEL
BUN: 10 mg/dL (ref 6–23)
CO2: 31 mEq/L (ref 19–32)
Calcium: 9.5 mg/dL (ref 8.4–10.5)
Chloride: 99 mEq/L (ref 96–112)
Creatinine, Ser: 0.85 mg/dL (ref 0.40–1.20)
GFR: 76.98 mL/min (ref >60.00)
Glucose, Bld: 106 mg/dL — ABNORMAL HIGH (ref 70–99)
Potassium: 3.4 mEq/L — ABNORMAL LOW (ref 3.5–5.1)
Sodium: 138 mEq/L (ref 135–145)

## 2021-06-10 LAB — CBC
HCT: 35.8 % — ABNORMAL LOW (ref 36.0–46.0)
Hemoglobin: 12.3 g/dL (ref 12.0–15.0)
MCHC: 34.3 g/dL (ref 30.0–36.0)
MCV: 88.3 fl (ref 78.0–100.0)
Platelets: 216 10*3/uL (ref 150.0–400.0)
RBC: 4.06 Mil/uL (ref 3.87–5.11)
RDW: 12.7 % (ref 11.5–15.5)
WBC: 4.9 10*3/uL (ref 4.0–10.5)

## 2021-06-10 LAB — LDL CHOLESTEROL, DIRECT: Direct LDL: 120 mg/dL

## 2021-06-10 LAB — HEMOGLOBIN A1C: Hgb A1c MFr Bld: 6 % (ref 4.6–6.5)

## 2021-06-11 ENCOUNTER — Telehealth: Payer: Self-pay | Admitting: Interventional Cardiology

## 2021-06-11 NOTE — Telephone Encounter (Signed)
Pt states she spoke with PCP today about labs she had drawn on 3/23 and K+ was a little low at 3.4.  She was concerned that this could be causing the fatigue that she's been having.  Explained it's not impossible but she's barely below normal and has ran 3.5-3.6 historically, occasionally being higher, so unlikely that it's the culprit.  Asked if they mentioned her urine at all as it appears she possibly has a UTI.  Pt states they did not mention that.  She said she has noticed increased odor with urine and cloudiness.  Advised result may not have been back yet when PCP seen blood work.  Advised if worsening over the weekend, please seek help at Urgent Care or ER.  ? ?Pt inquired about possibly coming off HCTZ because of low BPs.  BP at PCP yesterday was 115/68.  BP at home today was 102/66.  Denies any dizziness or vision issues.  Advised I will send message to Dr. Tamala Julian for review.   ?

## 2021-06-11 NOTE — Telephone Encounter (Signed)
Patient states she has been feeling weak today. She says she contacted her PCP had her a1c checked. She says it was great, but her potassium was low for the first time in her life. She says they told her it was really borderline with the normal range. She says she will sit down and eat a banana. She says if she feels worse she will go to the hospital, but for now she is okay. She says her BP is 102/66 HR 65. She says she is not dizzy.  ?

## 2021-06-12 ENCOUNTER — Other Ambulatory Visit: Payer: Self-pay | Admitting: Cardiology

## 2021-06-14 MED ORDER — HYDROCHLOROTHIAZIDE 25 MG PO TABS: 12.5000 mg | ORAL_TABLET | Freq: Every day | ORAL | 3 refills | Status: DC

## 2021-06-14 NOTE — Telephone Encounter (Signed)
Spoke with pt and made her aware of recommendations per Dr.Smith.  Pt agreeable to plan. ? ?While on the phone she mentioned that she would prefer the tablet vs capsule of HCTZ.  Advised I will send tablet in and she will have to break in half.  Pt verbalized understanding.  ?

## 2021-06-16 ENCOUNTER — Telehealth: Payer: Self-pay | Admitting: Family Medicine

## 2021-06-16 DIAGNOSIS — R350 Frequency of micturition: Secondary | ICD-10-CM

## 2021-06-16 NOTE — Telephone Encounter (Signed)
Please advise message below, from what I see there is no UTI.  ?

## 2021-06-16 NOTE — Telephone Encounter (Signed)
Pt has gone to her cardiologist and the RN at that office told her she has a UTI that showed in the urine test she got here. She is frustrated that she did not get  a call about that result from [place].  She talked for about 20 min (complained) but she wasn't complaining. I assured her we do care and it takes 24 to 48 hours for a response, most times less. She would like a call asap for the results and a medication if needed. She wanted it stated she left another practice for this reason, she didn't feel she was taken care of properly. ?

## 2021-06-16 NOTE — Telephone Encounter (Signed)
Spoke to patient for 25 mins regarding needing to schedule a follow up appointment,  she did scheduled for 06/18/21 @ 2:20 pm, but would like to know if she can come into tthe lab in the morning and leave a urine and have a virtual appointment instead of in person on 06/18/21.  ?Please review and advise.  ?Thanks.   Dm/cma ? ?

## 2021-06-17 NOTE — Telephone Encounter (Signed)
Spoke to patient and she will come in the morning to leave a urine sample.  Dm/cma ? ?

## 2021-06-17 NOTE — Telephone Encounter (Signed)
Appointment scheduled for virtual 

## 2021-06-18 ENCOUNTER — Other Ambulatory Visit (INDEPENDENT_AMBULATORY_CARE_PROVIDER_SITE_OTHER): Payer: BC Managed Care – PPO

## 2021-06-18 ENCOUNTER — Encounter: Payer: Self-pay | Admitting: Family Medicine

## 2021-06-18 ENCOUNTER — Telehealth (INDEPENDENT_AMBULATORY_CARE_PROVIDER_SITE_OTHER): Payer: BC Managed Care – PPO | Admitting: Family Medicine

## 2021-06-18 DIAGNOSIS — R350 Frequency of micturition: Secondary | ICD-10-CM | POA: Diagnosis not present

## 2021-06-18 DIAGNOSIS — R8290 Unspecified abnormal findings in urine: Secondary | ICD-10-CM

## 2021-06-18 LAB — URINALYSIS, ROUTINE W REFLEX MICROSCOPIC
Bilirubin Urine: NEGATIVE
Hgb urine dipstick: NEGATIVE
Ketones, ur: NEGATIVE
Nitrite: NEGATIVE
RBC / HPF: NONE SEEN (ref 0–?)
Specific Gravity, Urine: 1.02 (ref 1.000–1.030)
Total Protein, Urine: NEGATIVE
Urine Glucose: NEGATIVE
Urobilinogen, UA: 0.2 (ref 0.0–1.0)
pH: 6 (ref 5.0–8.0)

## 2021-06-18 NOTE — Progress Notes (Signed)
? ?Established Patient Office Visit ? ?Subjective:  ?Patient ID: Jenny Wright, female    DOB: 06-Mar-1966  Age: 56 y.o. MRN: [id number] ? ?CC:  ?Chief Complaint  ?Patient presents with  ? Advice Only  ?  Discuss labs   ? ? ?HPI ?Jenny Wright presents for follow-up of an abnormal urinalysis collected back on the 23rd.  That urinalysis was not a clean-catch.  She gave [place] a repeat urine today looks much better.  It was indeed a clean-catch urine.  ? ?Past Medical History:  ?Diagnosis Date  ? Acute bilateral low back pain without sciatica 10/01/2020  ? Anemia   ? Antiphospholipid antibody positive 02/13/2012  ? Arthralgia of right temporomandibular joint 01/03/2017  ? Bell's palsy   ? Bilateral impacted cerumen 09/29/2015  ? BPPV (benign paroxysmal positional vertigo) 09/29/2015  ? DDD (degenerative disc disease), lumbar 10/07/2014  ? Factor V Leiden mutation complicating pregnancy ([place])   ? 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 History of COVID-19 09/02/2020  ? Laryngopharyngeal reflux 06/29/2020  ? Menstrual migraine without status migrainosus, not intractable 03/12/2014  ? Miscarriage   ? Onychomycosis 08/29/2012  ? Preeclampsia   ? pregnancy  ? Rash and nonspecific skin eruption 08/29/2012  ? Scoliosis   ? Seasonal allergic rhinitis 01/03/2017  ? Sensorineural hearing loss (SNHL) of both ears 09/29/2015  ? Sinus congestion 09/02/2020  ? Systemic lupus erythematosus (SLE) inhibitor ([place]) 02/13/2012  ? Formatting of this note might be different from the original. Last Assessment & Plan:  Discussed possible hematology consult in future for more definitive dx.  ? Throat irritation 03/19/2019  ? Tinea pedis 08/29/2012  ? Uterine fibroid 02/13/2012  ? Uterine leiomyoma 02/13/2012  ? ? ?Past Surgical History:  ?Procedure Laterality Date  ? BACK SURGERY    ? CESAREAN SECTION    ? harrington rod  1980  ? ? ?Family History  ?Problem Relation Age of Onset  ? Breast cancer Mother   ?     deceased  ? Heart disease  Father   ?     deceased  ? Lung cancer Brother   ?     deceased  ? ? ?Social History  ? ?Socioeconomic History  ? Marital status: Divorced  ?  Spouse name: Not on file  ? Number of children: Not on file  ? Years of education: Not on file  ? Highest education level: Not on file  ?Occupational History  ? Not on file  ?Tobacco Use  ? Smoking status: Never  ? Smokeless tobacco: Never  ?Vaping Use  ? Vaping Use: Never used  ?Substance and Sexual Activity  ? Alcohol use: No  ? Drug use: No  ? Sexual activity: Not Currently  ?  Birth control/protection: None  ?Other Topics Concern  ? Not on file  ?Social History Narrative  ? Not on file  ? ?Social Determinants of Health  ? ?Financial Resource Strain: Not on file  ?Food Insecurity: Not on file  ?Transportation Needs: Not on file  ?Physical Activity: Not on file  ?Stress: Not on file  ?Social Connections: Not on file  ?Intimate Partner Violence: Not on file  ? ? ?Outpatient Medications Prior to Visit  ?Medication Sig Dispense Refill  ? Cholecalciferol (VITAMIN D) 50 MCG (2000 UT) tablet Take 2,000 Units by mouth daily.    ? CVS VITAMIN C 1000 MG tablet Take 2,000 mg by mouth daily.    ? Ferrous Sulfate (IRON PO) Take by mouth  daily.    ? hydrochlorothiazide (HYDRODIURIL) 25 MG tablet Take 0.5 tablets (12.5 mg total) by mouth daily. 45 tablet 3  ? Multiple Vitamin (MULTI-VITAMIN) tablet Take 1 tablet by mouth daily.    ? zinc gluconate 50 MG tablet Take 50 mg by mouth daily.    ? ?No facility-administered medications prior to visit.  ? ? ?Allergies  ?Allergen Reactions  ? Penicillins Anaphylaxis, Shortness Of Breath and Swelling  ?  SOB ? ?  ? Methylprednisolone Other (See Comments)  ?  shakes  ? Omeprazole Palpitations  ? ? ?ROS ?Review of Systems  ?Constitutional: Negative.   ?Respiratory: Negative.    ?Cardiovascular: Negative.   ?Gastrointestinal: Negative.   ?Genitourinary: Negative.   ? ?  ?Objective:  ?  ?Physical Exam ?Vitals reviewed.  ?Constitutional:   ?    Appearance: Normal appearance.  ?HENT:  ?   Head: Normocephalic and atraumatic.  ?Pulmonary:  ?   Effort: Pulmonary effort is normal.  ?Neurological:  ?   General: No focal deficit present.  ?   Mental Status: She is alert and oriented to person, place, and time.  ?Psychiatric:     ?   Mood and Affect: Mood normal.     ?   Behavior: Behavior normal.  ? ? ?There were no vitals taken for this visit. ?Wt Readings from Last 3 Encounters:  ?06/08/21 155 lb (70.3 kg)  ?05/31/21 153 lb 3.2 oz (69.5 kg)  ?01/24/21 167 lb (75.8 kg)  ? ? ? ?Health Maintenance Due  ?Topic Date Due  ? HIV Screening  Never done  ? Hepatitis C Screening  Never done  ? ? ?There are no preventive care reminders to display for this patient. ? ?No results found for: TSH ?Lab Results  ?Component Value Date  ? WBC 4.9 06/10/2021  ? HGB 12.3 06/10/2021  ? HCT 35.8 (L) 06/10/2021  ? MCV 88.3 06/10/2021  ? PLT 216.0 06/10/2021  ? ?Lab Results  ?Component Value Date  ? NA 138 06/10/2021  ? K 3.4 (L) 06/10/2021  ? CO2 31 06/10/2021  ? GLUCOSE 106 (H) 06/10/2021  ? BUN 10 06/10/2021  ? CREATININE 0.85 06/10/2021  ? BILITOT 0.2 (L) 08/23/2020  ? ALKPHOS 77 08/23/2020  ? AST 24 08/23/2020  ? ALT 19 08/23/2020  ? PROT 7.7 08/23/2020  ? ALBUMIN 4.3 08/23/2020  ? CALCIUM 9.5 06/10/2021  ? ANIONGAP 9 08/23/2020  ? GFR 76.98 06/10/2021  ? ?No results found for: CHOL ?No results found for: HDL ?No results found for: [place] ?No results found for: TRIG ?No results found for: CHOLHDL ?Lab Results  ?Component Value Date  ? HGBA1C 6.0 06/10/2021  ? ? ?  ?Assessment & Plan:  ? ?Problem List Items Addressed This Visit   ? ?  ? Other  ? Abnormal finding on urinalysis - Primary  ? ? ?No orders of the defined types were placed in this encounter. ? ? ?Follow-up: Return if symptoms worsen or fail to improve.  ?Explained the importance of a clean-catch urine that she provided today.  There were some white blood cells in her urine collected today as indicated by the strip.  I  explained that this is not an uncommon finding.  There was no other indication of a UTI.  We are sending a culture for safety sake.  I will inform her of the results.  ? ?Libby Maw, MD ? ?Virtual Visit via Video Note ? ?I connected with Jenny Wright on 06/18/21  at  2:20 PM EDT by a video enabled telemedicine application and verified that I am speaking with the correct person using two identifiers. ? ?Location: ?Patient: in a room alone.  ?Provider: work ?  ?I discussed the limitations of evaluation and management by telemedicine and the availability of in person appointments. The patient expressed understanding and agreed to proceed. ? ?History of Present Illness: ? ?  ?Observations/Objective: ? ? ?Assessment and Plan: ? ? ?Follow Up Instructions: ? ?  ?I discussed the assessment and treatment plan with the patient. The patient was provided an opportunity to ask questions and all were answered. The patient agreed with the plan and demonstrated an understanding of the instructions. ?  ?The patient was advised to call back or seek an in-person evaluation if the symptoms worsen or if the condition fails to improve as anticipated. ? ?I provided 15 minutes of non-face-to-face time during this encounter. ? ? ?Libby Maw, MD  ?

## 2021-06-19 LAB — URINE CULTURE
MICRO NUMBER:: 13207588
SPECIMEN QUALITY:: ADEQUATE

## 2021-06-24 ENCOUNTER — Encounter: Payer: Self-pay | Admitting: Family Medicine

## 2021-06-24 ENCOUNTER — Ambulatory Visit: Payer: BC Managed Care – PPO | Admitting: Family Medicine

## 2021-06-24 VITALS — BP 118/68 | HR 71 | Temp 97.9°F | Ht 67.0 in | Wt 156.2 lb

## 2021-06-24 DIAGNOSIS — J302 Other seasonal allergic rhinitis: Secondary | ICD-10-CM

## 2021-06-24 DIAGNOSIS — J069 Acute upper respiratory infection, unspecified: Secondary | ICD-10-CM | POA: Diagnosis not present

## 2021-06-24 DIAGNOSIS — H6983 Other specified disorders of Eustachian tube, bilateral: Secondary | ICD-10-CM | POA: Diagnosis not present

## 2021-06-24 DIAGNOSIS — H6123 Impacted cerumen, bilateral: Secondary | ICD-10-CM | POA: Diagnosis not present

## 2021-06-24 DIAGNOSIS — H6993 Unspecified Eustachian tube disorder, bilateral: Secondary | ICD-10-CM

## 2021-06-24 MED ORDER — CETIRIZINE HCL 10 MG PO TABS: 10.0000 mg | ORAL_TABLET | Freq: Every day | ORAL | 11 refills | Status: DC

## 2021-06-24 MED ORDER — FLUTICASONE PROPIONATE 50 MCG/ACT NA SUSP: 2.0000 | Freq: Every day | NASAL | 6 refills | Status: DC

## 2021-06-24 NOTE — Progress Notes (Signed)
? ?Established Patient Office Visit ? ?Subjective:  ?Patient ID: Jenny Wright, female    DOB: 27-Jul-1965  Age: 56 y.o. MRN: [id number] ? ?CC:  ?Chief Complaint  ?Patient presents with  ? Nasal Congestion  ?  Nasal and chest congestion, red eyes symptoms x 1 week little pain in right ear x 3-4 days, red eyes yesterday. Coughing started yesterday.  ? ? ?HPI ?Jenny Wright presents for a 7 to 10-day history of nasal and ear can congestion, postnasal drip, cough.  Eyes were injected yesterday but that is cleared.  She denies sneezing.  She denies wheezing.  She actually feels okay without malaise or fatigue.  She usually has these type of symptoms in the spring.  She also suffers from indigestion.  It is sometimes difficult for her to decide what is going on and she wants me to take a look.  History of cerumen gnosis in the past.  She sees ENT for removal.  She is currently using saline washes and Vicks.  She has been advised not to use decongestants.  She does not tolerate oral steroids. ? ?Past Medical History:  ?Diagnosis Date  ? Acute bilateral low back pain without sciatica 10/01/2020  ? Anemia   ? Antiphospholipid antibody positive 02/13/2012  ? Arthralgia of right temporomandibular joint 01/03/2017  ? Bell's palsy   ? Bilateral impacted cerumen 09/29/2015  ? BPPV (benign paroxysmal positional vertigo) 09/29/2015  ? DDD (degenerative disc disease), lumbar 10/07/2014  ? Factor V Leiden mutation complicating pregnancy ([place])   ? 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 History of COVID-19 09/02/2020  ? Laryngopharyngeal reflux 06/29/2020  ? Menstrual migraine without status migrainosus, not intractable 03/12/2014  ? Miscarriage   ? Onychomycosis 08/29/2012  ? Preeclampsia   ? pregnancy  ? Rash and nonspecific skin eruption 08/29/2012  ? Scoliosis   ? Seasonal allergic rhinitis 01/03/2017  ? Sensorineural hearing loss (SNHL) of both ears 09/29/2015  ? Sinus congestion 09/02/2020  ? Systemic lupus erythematosus  (SLE) inhibitor ([place]) 02/13/2012  ? Formatting of this note might be different from the original. Last Assessment & Plan:  Discussed possible hematology consult in future for more definitive dx.  ? Throat irritation 03/19/2019  ? Tinea pedis 08/29/2012  ? Uterine fibroid 02/13/2012  ? Uterine leiomyoma 02/13/2012  ? ? ?Past Surgical History:  ?Procedure Laterality Date  ? BACK SURGERY    ? CESAREAN SECTION    ? harrington rod  1980  ? ? ?Family History  ?Problem Relation Age of Onset  ? Breast cancer Mother   ?     deceased  ? Heart disease Father   ?     deceased  ? Lung cancer Brother   ?     deceased  ? ? ?Social History  ? ?Socioeconomic History  ? Marital status: Divorced  ?  Spouse name: Not on file  ? Number of children: Not on file  ? Years of education: Not on file  ? Highest education level: Not on file  ?Occupational History  ? Not on file  ?Tobacco Use  ? Smoking status: Never  ? Smokeless tobacco: Never  ?Vaping Use  ? Vaping Use: Never used  ?Substance and Sexual Activity  ? Alcohol use: No  ? Drug use: No  ? Sexual activity: Not Currently  ?  Birth control/protection: None  ?Other Topics Concern  ? Not on file  ?Social History Narrative  ? Not on file  ? ?Social Determinants  of Health  ? ?Financial Resource Strain: Not on file  ?Food Insecurity: Not on file  ?Transportation Needs: Not on file  ?Physical Activity: Not on file  ?Stress: Not on file  ?Social Connections: Not on file  ?Intimate Partner Violence: Not on file  ? ? ?Outpatient Medications Prior to Visit  ?Medication Sig Dispense Refill  ? Cholecalciferol (VITAMIN D) 50 MCG (2000 UT) tablet Take 2,000 Units by mouth daily.    ? CVS VITAMIN C 1000 MG tablet Take 2,000 mg by mouth daily.    ? Ferrous Sulfate (IRON PO) Take by mouth daily.    ? hydrochlorothiazide (HYDRODIURIL) 25 MG tablet Take 0.5 tablets (12.5 mg total) by mouth daily. 45 tablet 3  ? Multiple Vitamin (MULTI-VITAMIN) tablet Take 1 tablet by mouth daily.    ? zinc gluconate 50  MG tablet Take 50 mg by mouth daily.    ? ?No facility-administered medications prior to visit.  ? ? ?Allergies  ?Allergen Reactions  ? Penicillins Anaphylaxis, Shortness Of Breath and Swelling  ?  SOB ? ?  ? Methylprednisolone Other (See Comments)  ?  shakes  ? Omeprazole Palpitations  ? ? ?ROS ?Review of Systems  ?Constitutional:  Negative for chills, diaphoresis, fatigue, fever and unexpected weight change.  ?HENT:  Positive for congestion and hearing loss. Negative for sneezing.   ?Eyes:  Negative for photophobia and visual disturbance.  ?Respiratory:  Positive for cough. Negative for shortness of breath and wheezing.   ?Cardiovascular: Negative.   ?Gastrointestinal: Negative.   ?Neurological:  Positive for headaches. Negative for speech difficulty, weakness and light-headedness.  ? ?  ?Objective:  ?  ?Physical Exam ?Constitutional:   ?   General: She is not in acute distress. ?   Appearance: Normal appearance. She is not ill-appearing, toxic-appearing or diaphoretic.  ?HENT:  ?   Head: Normocephalic and atraumatic.  ?   Right Ear: No middle ear effusion. There is impacted cerumen. Tympanic membrane is retracted. Tympanic membrane is not injected, erythematous or bulging.  ?   Left Ear:  No middle ear effusion. There is impacted cerumen. Tympanic membrane is retracted. Tympanic membrane is not injected, erythematous or bulging.  ?   Mouth/Throat:  ?   Mouth: Mucous membranes are moist.  ?   Pharynx: Oropharynx is clear. No oropharyngeal exudate or posterior oropharyngeal erythema.  ?Eyes:  ?   General: No scleral icterus.    ?   Right eye: No discharge.     ?   Left eye: No discharge.  ?   Extraocular Movements: Extraocular movements intact.  ?   Conjunctiva/sclera: Conjunctivae normal.  ?   Pupils: Pupils are equal, round, and reactive to light.  ?Cardiovascular:  ?   Rate and Rhythm: Normal rate and regular rhythm.  ?Pulmonary:  ?   Effort: Pulmonary effort is normal.  ?   Breath sounds: Normal breath sounds.  No wheezing or rales.  ?Abdominal:  ?   General: Bowel sounds are normal.  ?Musculoskeletal:  ?   Cervical back: No rigidity or tenderness.  ?Lymphadenopathy:  ?   Cervical: No cervical adenopathy.  ?Skin: ?   General: Skin is warm and dry.  ?Neurological:  ?   Mental Status: She is alert and oriented to person, place, and time.  ?Psychiatric:     ?   Mood and Affect: Mood normal.     ?   Behavior: Behavior normal.  ? ? ?BP 118/68 (BP Location: Right Arm, Patient Position: Sitting,  Cuff Size: Large)   Pulse 71   Temp 97.9 ?F (36.6 ?C) (Temporal)   Ht '5\' 7"'$  (1.702 m)   Wt 156 lb 3.2 oz (70.9 kg)   SpO2 98%   BMI 24.46 kg/m?  ?Wt Readings from Last 3 Encounters:  ?06/24/21 156 lb 3.2 oz (70.9 kg)  ?06/08/21 155 lb (70.3 kg)  ?05/31/21 153 lb 3.2 oz (69.5 kg)  ? ? ? ?Health Maintenance Due  ?Topic Date Due  ? HIV Screening  Never done  ? Hepatitis C Screening  Never done  ? ? ?There are no preventive care reminders to display for this patient. ? ?No results found for: TSH ?Lab Results  ?Component Value Date  ? WBC 4.9 06/10/2021  ? HGB 12.3 06/10/2021  ? HCT 35.8 (L) 06/10/2021  ? MCV 88.3 06/10/2021  ? PLT 216.0 06/10/2021  ? ?Lab Results  ?Component Value Date  ? NA 138 06/10/2021  ? K 3.4 (L) 06/10/2021  ? CO2 31 06/10/2021  ? GLUCOSE 106 (H) 06/10/2021  ? BUN 10 06/10/2021  ? CREATININE 0.85 06/10/2021  ? BILITOT 0.2 (L) 08/23/2020  ? ALKPHOS 77 08/23/2020  ? AST 24 08/23/2020  ? ALT 19 08/23/2020  ? PROT 7.7 08/23/2020  ? ALBUMIN 4.3 08/23/2020  ? CALCIUM 9.5 06/10/2021  ? ANIONGAP 9 08/23/2020  ? GFR 76.98 06/10/2021  ? ?No results found for: CHOL ?No results found for: HDL ?No results found for: [place] ?No results found for: TRIG ?No results found for: CHOLHDL ?Lab Results  ?Component Value Date  ? HGBA1C 6.0 06/10/2021  ? ? ?  ?Assessment & Plan:  ? ?Problem List Items Addressed This Visit   ? ?  ? Respiratory  ? Seasonal allergic rhinitis - Primary  ? Relevant Medications  ? cetirizine (ZYRTEC) 10 MG  tablet  ? fluticasone (FLONASE) 50 MCG/ACT nasal spray  ?  ? Nervous and Auditory  ? Ceruminosis, bilateral  ? Dysfunction of both eustachian tubes  ? Relevant Medications  ? cetirizine (ZYRTEC) 10 MG tablet

## 2021-06-26 LAB — NOVEL CORONAVIRUS, NAA: SARS-CoV-2, NAA: NOT DETECTED

## 2021-06-30 NOTE — Progress Notes (Unsigned)
Patient ID: Jenny Wright                 DOB: 08-29-1965                      MRN: [id number] ? ? ? ? ?HPI: ?Hasset Chaviano is a 56 y.o. female referred by Dr. Tamala Julian to HTN clinic. PMH is significant for labile hypertension, COVID19 07/2020, Factor V Leiden mutation complicating pregnancy as well as preeclampsia, mild overweight, and DOE. Pt has no real cardiopulmonary symptoms but post COVID infection had DOE and was concerned about underlying heart problems. DOE improved after stopping metoprolol succinate and switching to HCTZ for better BP control in 01/2021. At 05/31/21 visit with Dr. Tamala Julian, BP was 100/58 and HCTZ was decreased from 25 mg to 12.5 mg daily. K was low at 3.4 on 06/10/21 BMET. Pt told to increase potassium in her diet. BP at [facility] appointments were well-controlled at 115/68 and 118/68. ? ?Pt presents today for HTN f/u.  ? ??tolerating HCTZ ??increased K in diet, recheck BMET ? ?Current HTN meds: HCTZ 12.5 mg daily ?Previously tried: HCTZ 25 mg daily, metoprolol succinate 25 mg daily ? ?BP goal: <130/80 ? ?Family History: The patient's family history includes Breast cancer in her mother; Heart disease in her father; Lung cancer in her brother. ? ?Social History: Denies tobacco, alcohol, and illicit drug use ? ?Diet:  ??increased K ? ?Exercise:  ? ?Home BP readings:  ? ?Wt Readings from Last 3 Encounters:  ?06/24/21 156 lb 3.2 oz (70.9 kg)  ?06/08/21 155 lb (70.3 kg)  ?05/31/21 153 lb 3.2 oz (69.5 kg)  ? ?BP Readings from Last 3 Encounters:  ?06/24/21 118/68  ?06/08/21 115/68  ?05/31/21 (!) 100/58  ? ?Pulse Readings from Last 3 Encounters:  ?06/24/21 71  ?06/08/21 73  ?05/31/21 74  ? ? ?Renal function: ?Estimated Creatinine Clearance: 72.7 mL/min (by C-G formula based on SCr of 0.85 mg/dL). ? ?Past Medical History:  ?Diagnosis Date  ? Acute bilateral low back pain without sciatica 10/01/2020  ? Anemia   ? Antiphospholipid antibody positive 02/13/2012  ? Arthralgia of right  temporomandibular joint 01/03/2017  ? Bell's palsy   ? Bilateral impacted cerumen 09/29/2015  ? BPPV (benign paroxysmal positional vertigo) 09/29/2015  ? DDD (degenerative disc disease), lumbar 10/07/2014  ? Factor V Leiden mutation complicating pregnancy ([place])   ? 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 History of COVID-19 09/02/2020  ? Laryngopharyngeal reflux 06/29/2020  ? Menstrual migraine without status migrainosus, not intractable 03/12/2014  ? Miscarriage   ? Onychomycosis 08/29/2012  ? Preeclampsia   ? pregnancy  ? Rash and nonspecific skin eruption 08/29/2012  ? Scoliosis   ? Seasonal allergic rhinitis 01/03/2017  ? Sensorineural hearing loss (SNHL) of both ears 09/29/2015  ? Sinus congestion 09/02/2020  ? Systemic lupus erythematosus (SLE) inhibitor ([place]) 02/13/2012  ? Formatting of this note might be different from the original. Last Assessment & Plan:  Discussed possible hematology consult in future for more definitive dx.  ? Throat irritation 03/19/2019  ? Tinea pedis 08/29/2012  ? Uterine fibroid 02/13/2012  ? Uterine leiomyoma 02/13/2012  ? ? ?Current Outpatient Medications on File Prior to Visit  ?Medication Sig Dispense Refill  ? cetirizine (ZYRTEC) 10 MG tablet Take 1 tablet (10 mg total) by mouth daily. 30 tablet 11  ? Cholecalciferol (VITAMIN D) 50 MCG (2000 UT) tablet Take 2,000 Units by mouth daily.    ?  CVS VITAMIN C 1000 MG tablet Take 2,000 mg by mouth daily.    ? Ferrous Sulfate (IRON PO) Take by mouth daily.    ? fluticasone (FLONASE) 50 MCG/ACT nasal spray Place 2 sprays into both nostrils daily. 16 g 6  ? hydrochlorothiazide (HYDRODIURIL) 25 MG tablet Take 0.5 tablets (12.5 mg total) by mouth daily. 45 tablet 3  ? Multiple Vitamin (MULTI-VITAMIN) tablet Take 1 tablet by mouth daily.    ? zinc gluconate 50 MG tablet Take 50 mg by mouth daily.    ? ?No current facility-administered medications on file prior to visit.  ? ? ?Allergies  ?Allergen Reactions  ? Penicillins Anaphylaxis, Shortness  Of Breath and Swelling  ?  SOB ? ?  ? Methylprednisolone Other (See Comments)  ?  shakes  ? Omeprazole Palpitations  ? ? ?Assessment/Plan: ? ?1. Hypertension - BP XX at/above/below goal < 130/80 mmHg. Continue HCTZ 12.5 mg daily. ? ?Patient seen with Debria Garret, [employer] student ? ?

## 2021-07-01 ENCOUNTER — Ambulatory Visit: Payer: BC Managed Care – PPO

## 2021-07-05 ENCOUNTER — Telehealth: Payer: Self-pay | Admitting: Family Medicine

## 2021-07-05 DIAGNOSIS — R8290 Unspecified abnormal findings in urine: Secondary | ICD-10-CM

## 2021-07-05 NOTE — Telephone Encounter (Signed)
Pt is wanting a cb concerning her most recent lab results. Her timeframe for calls is between  1-1:30, 2-2:30 and 3-3:30 only for today 07/05/21. Please advise pt at [phone]. ?

## 2021-07-06 ENCOUNTER — Telehealth: Payer: Self-pay | Admitting: Interventional Cardiology

## 2021-07-06 NOTE — Telephone Encounter (Signed)
Went over patients labs patient still has concerns that her urine is sometimes cloudy and has a smell to it. Patient wondering if this maybe a cause to the BP medication that Dr. Tamala Julian put her on? States that she takes in about 30-32oz of water a day. Please advise  ?

## 2021-07-06 NOTE — Telephone Encounter (Signed)
Pt c/o medication issue: ? ?1. Name of Medication:  ?hydrochlorothiazide (HYDRODIURIL) 25 MG tablet ? ? ?2. How are you currently taking this medication (dosage and times per day)?  ?As prescribed 1/2 tablet daily ? ?3. Are you having a reaction (difficulty breathing--STAT)?  ?No  ? ?4. What is your medication issue?  ? ?Patient states since beginning this medication she has been experiencing an odd smell in her urine. She would like to know if this is normal. She states she has also been having back spasms and it unsure if the medication caused this. ? ?

## 2021-07-06 NOTE — Telephone Encounter (Signed)
Spoke with pt and has noted strong  urine odor since starting HCTZ  and pt was wandering if this could cause strong urine smell    Also has had back spasms U/A recently done and wnl and bmet was normal as well Discussed with pharmacy and thought maybe slightly dehydrated or it may cause urine odor  Pt wanting to know how much fluid is allowed Appears pt does not need to be on fluid restriction and may drink as much as 88 oz per day  Encouraged pt to contact PCP re back spasms pt may need to see  kidney MD .Will forward to Dr Tamala Julian for review ./cy ?

## 2021-07-07 NOTE — Telephone Encounter (Signed)
Patient aware of message below.

## 2021-07-08 NOTE — Progress Notes (Deleted)
Patient ID: Jenny Wright                 DOB: Jul 27, 1965                      MRN: [id number] ? ? ? ? ?HPI: ?Jenny Wright is a 56 y.o. female referred by Dr. Tamala Julian to HTN clinic. PMH is significant for labile hypertension, COVID19 07/2020, Factor V Leiden mutation complicating pregnancy as well as preeclampsia, mild overweight, and DOE. Pt has no real cardiopulmonary symptoms but post COVID infection had DOE and was concerned about underlying heart problems. DOE improved after stopping metoprolol succinate and switching to HCTZ for better BP control in 01/2021. At 05/31/21 visit with Dr. Tamala Julian, BP was 100/58 and HCTZ was decreased from 25 mg to 12.5 mg daily. K was low at 3.4 on 06/10/21 BMET. Pt told to increase potassium in her diet. BP at [facility] Med appointments were well-controlled at 115/68 and 118/68. ? ?Pt presents today for HTN f/u.  ? ??tolerating HCTZ ??increased K in diet, recheck BMET ? ?Current HTN meds: HCTZ 12.5 mg daily ?Previously tried: HCTZ 25 mg daily, metoprolol succinate 25 mg daily ? ?BP goal: <130/80 ? ?Family History: The patient's family history includes Breast cancer in her mother; Heart disease in her father; Lung cancer in her brother. ? ?Social History: Denies tobacco, alcohol, and illicit drug use ? ?Diet:  ??increased K ? ?Exercise:  ? ?Home BP readings:  ? ?Wt Readings from Last 3 Encounters:  ?06/24/21 156 lb 3.2 oz (70.9 kg)  ?06/08/21 155 lb (70.3 kg)  ?05/31/21 153 lb 3.2 oz (69.5 kg)  ? ?BP Readings from Last 3 Encounters:  ?06/24/21 118/68  ?06/08/21 115/68  ?05/31/21 (!) 100/58  ? ?Pulse Readings from Last 3 Encounters:  ?06/24/21 71  ?06/08/21 73  ?05/31/21 74  ? ? ?Renal function: ?CrCl cannot be calculated (Patient's most recent lab result is older than the maximum 21 days allowed.). ? ?Past Medical History:  ?Diagnosis Date  ? Acute bilateral low back pain without sciatica 10/01/2020  ? Anemia   ? Antiphospholipid antibody positive 02/13/2012  ? Arthralgia of right  temporomandibular joint 01/03/2017  ? Bell's palsy   ? Bilateral impacted cerumen 09/29/2015  ? BPPV (benign paroxysmal positional vertigo) 09/29/2015  ? DDD (degenerative disc disease), lumbar 10/07/2014  ? Factor V Leiden mutation complicating pregnancy ([place])   ? 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 History of COVID-19 09/02/2020  ? Laryngopharyngeal reflux 06/29/2020  ? Menstrual migraine without status migrainosus, not intractable 03/12/2014  ? Miscarriage   ? Onychomycosis 08/29/2012  ? Preeclampsia   ? pregnancy  ? Rash and nonspecific skin eruption 08/29/2012  ? Scoliosis   ? Seasonal allergic rhinitis 01/03/2017  ? Sensorineural hearing loss (SNHL) of both ears 09/29/2015  ? Sinus congestion 09/02/2020  ? Systemic lupus erythematosus (SLE) inhibitor ([place]) 02/13/2012  ? Formatting of this note might be different from the original. Last Assessment & Plan:  Discussed possible hematology consult in future for more definitive dx.  ? Throat irritation 03/19/2019  ? Tinea pedis 08/29/2012  ? Uterine fibroid 02/13/2012  ? Uterine leiomyoma 02/13/2012  ? ? ?Current Outpatient Medications on File Prior to Visit  ?Medication Sig Dispense Refill  ? cetirizine (ZYRTEC) 10 MG tablet Take 1 tablet (10 mg total) by mouth daily. 30 tablet 11  ? Cholecalciferol (VITAMIN D) 50 MCG (2000 UT) tablet Take 2,000 Units by mouth daily.    ?  CVS VITAMIN C 1000 MG tablet Take 2,000 mg by mouth daily.    ? Ferrous Sulfate (IRON PO) Take by mouth daily.    ? fluticasone (FLONASE) 50 MCG/ACT nasal spray Place 2 sprays into both nostrils daily. 16 g 6  ? hydrochlorothiazide (HYDRODIURIL) 25 MG tablet Take 0.5 tablets (12.5 mg total) by mouth daily. 45 tablet 3  ? Multiple Vitamin (MULTI-VITAMIN) tablet Take 1 tablet by mouth daily.    ? zinc gluconate 50 MG tablet Take 50 mg by mouth daily.    ? ?No current facility-administered medications on file prior to visit.  ? ? ?Allergies  ?Allergen Reactions  ? Penicillins Anaphylaxis, Shortness  Of Breath and Swelling  ?  SOB ? ?  ? Methylprednisolone Other (See Comments)  ?  shakes  ? Omeprazole Palpitations  ? ? ?Assessment/Plan: ? ?1. Hypertension - BP XX at/above/below goal < 130/80 mmHg. Continue HCTZ 12.5 mg daily. ? ?Patient seen with Debria Garret, [place] pharmacy student ? ?

## 2021-07-08 NOTE — Progress Notes (Signed)
Patient ID: Jenny Wright                 DOB: 11/03/1965                      MRN: [id number] ? ? ? ? ?HPI: ?Jenny Wright is a 56 y.o. female referred by Dr. Tamala Julian to HTN clinic. PMH is significant for labile hypertension, COVID19 07/2020, Factor V Leiden mutation complicating pregnancy as well as preeclampsia, mild overweight, and DOE. Pt has no real cardiopulmonary symptoms but post COVID infection had DOE and was concerned about underlying heart problems. DOE improved after stopping metoprolol succinate and switching to HCTZ for better BP control in 01/2021. At 05/31/21 visit with Dr. Tamala Julian, BP was 100/58 and HCTZ was decreased from 25 mg to 12.5 mg daily. K was low at 3.4 on 06/10/21 BMET. Pt advised to increase potassium in her diet. BP at subsequent Family Med appointments were well-controlled at [facility].  ? ?Pt sent MyChart message on 07/06/21 reporting strong urine odor + back spasms and wanted to know if the medication could be causing these issues. She recently had a UA and BMET done which were both normal. PharmD and PCP stated pt might be slightly dehydrated which could be contributing to the strong odor and to increase fluids.  ? ?Pt presents today for HTN clinic appointment. Reports med compliance and takes HCTZ at night. She is still having the strong urine odor smell which has been present for ~1 year and is her primary frustration today. Also reports some lower back pain but has scoliosis as well. She is trying to increase her water intake, but struggles because she does not drink a lot to begin with. Denies dizziness, lightheadedness, blurred vision, and HA. Has increased her potassium in diet after her level came back slightly low. Pt did bring in home BP cuff which is measuring accurately. Reviewed technique as pt initially placed cuff on upsidedown and backwards. Home BP [id number] systolic. Home cuff reading in clinic 114/76, very comparable to manual reading. ? ?Current HTN  meds: HCTZ 12.5 mg daily ? ?BP goal: <130/41mHg ? ?Family History: heart disease (father) ? ?Social History: Denies tobacco, alcohol, and illicit drug use ? ?Diet: Has increased potassium in diet. Stopped eating greasy foods and ate more vegetables. Portion control ? ?Exercise: Lost 20 lbs. Walking 30 mins per day. Stationary bike - 20 mins ? ?Wt Readings from Last 3 Encounters:  ?06/24/21 156 lb 3.2 oz (70.9 kg)  ?06/08/21 155 lb (70.3 kg)  ?05/31/21 153 lb 3.2 oz (69.5 kg)  ? ?BP Readings from Last 3 Encounters:  ?06/24/21 118/68  ?06/08/21 115/68  ?05/31/21 (!) 100/58  ? ?Pulse Readings from Last 3 Encounters:  ?06/24/21 71  ?06/08/21 73  ?05/31/21 74  ? ?Renal function: ?CrCl cannot be calculated (Patient's most recent lab result is older than the maximum 21 days allowed.). ? ?Past Medical History:  ?Diagnosis Date  ? Acute bilateral low back pain without sciatica 10/01/2020  ? Anemia   ? Antiphospholipid antibody positive 02/13/2012  ? Arthralgia of right temporomandibular joint 01/03/2017  ? Bell's palsy   ? Bilateral impacted cerumen 09/29/2015  ? BPPV (benign paroxysmal positional vertigo) 09/29/2015  ? DDD (degenerative disc disease), lumbar 10/07/2014  ? [place]   ? 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 History of COVID-19 09/02/2020  ? Laryngopharyngeal reflux 06/29/2020  ? Menstrual migraine without  status migrainosus, not intractable 03/12/2014  ? Miscarriage   ? Onychomycosis 08/29/2012  ? Preeclampsia   ? pregnancy  ? Rash and nonspecific skin eruption 08/29/2012  ? Scoliosis   ? Seasonal allergic rhinitis 01/03/2017  ? Sensorineural hearing loss (SNHL) of both ears 09/29/2015  ? Sinus congestion 09/02/2020  ? Systemic lupus erythematosus (SLE) inhibitor ([place]) 02/13/2012  ? Formatting of this note might be different from the original. Last Assessment & Plan:  Discussed possible hematology consult in future for more definitive dx.  ? Throat irritation  03/19/2019  ? Tinea pedis 08/29/2012  ? Uterine fibroid 02/13/2012  ? Uterine leiomyoma 02/13/2012  ? ? ?Current Outpatient Medications on File Prior to Visit  ?Medication Sig Dispense Refill  ? cetirizine (ZYRTEC) 10 MG tablet Take 1 tablet (10 mg total) by mouth daily. 30 tablet 11  ? Cholecalciferol (VITAMIN D) 50 MCG (2000 UT) tablet Take 2,000 Units by mouth daily.    ? CVS VITAMIN C 1000 MG tablet Take 2,000 mg by mouth daily.    ? Ferrous Sulfate (IRON PO) Take by mouth daily.    ? fluticasone (FLONASE) 50 MCG/ACT nasal spray Place 2 sprays into both nostrils daily. 16 g 6  ? hydrochlorothiazide (HYDRODIURIL) 25 MG tablet Take 0.5 tablets (12.5 mg total) by mouth daily. 45 tablet 3  ? Multiple Vitamin (MULTI-VITAMIN) tablet Take 1 tablet by mouth daily.    ? zinc gluconate 50 MG tablet Take 50 mg by mouth daily.    ? ?No current facility-administered medications on file prior to visit.  ? ? ?Allergies  ?Allergen Reactions  ? Penicillins Anaphylaxis, Shortness Of Breath and Swelling  ?  SOB ? ?  ? Methylprednisolone Other (See Comments)  ?  shakes  ? Omeprazole Palpitations  ? ?Assessment/Plan: ? ?1. Hypertension - BP at goal <130/26mHg. Continue HCTZ 12.5 mg daily. Her home cuff is measuring accurately. Reiterated HCTZ was unlikely causing different smelling urine (symptoms have been going on for a year before HCTZ was started). Advised her to follow up with PCP and continue with increased fluid intake. Encouraged pt to increase activity level to 150 mins per week. Follow-up with PharmD as needed. ? ?Patient seen with SDebria Garret PVillarrealpharmacy student ? ?Megan E. Supple, PharmD, BCACP, CPP ?[id number] [place] [id number]?Phone: [phone] Fax: [phone]?07/09/2021 9:43 AM ? ?

## 2021-07-09 ENCOUNTER — Ambulatory Visit: Payer: BC Managed Care – PPO | Admitting: Pharmacist

## 2021-07-09 VITALS — BP 112/76 | HR 75

## 2021-07-09 DIAGNOSIS — I10 Essential (primary) hypertension: Secondary | ICD-10-CM | POA: Diagnosis not present

## 2021-07-09 NOTE — Addendum Note (Signed)
Addended by: Lynda Rainwater on: 07/09/2021 05:21 PM ? ? Modules accepted: Orders ? ?

## 2021-07-09 NOTE — Patient Instructions (Addendum)
Your blood pressure goal is < 130/80 ? ?Continue meds ? ?Try to increase activity up to 150 mins per week. ?

## 2021-07-09 NOTE — Telephone Encounter (Signed)
Patient calling states that she would like a referral to see a urologist due to strong smelling urine. Went over labs several times with patient and advised hydrating more per patient this is not helping. Okay for referral?  ?

## 2021-07-09 NOTE — Telephone Encounter (Signed)
Referral placed called patient to inform that someone will be giving her a call to schedule appointment. No answer LMTCB ?

## 2021-07-09 NOTE — Telephone Encounter (Signed)
Pt is calling back wanting a kidney specialist referral. She says she has been drinking a lot of water and still has a strong odor. Please advise pt at [phone] ?

## 2021-07-14 DIAGNOSIS — Z01419 Encounter for gynecological examination (general) (routine) without abnormal findings: Secondary | ICD-10-CM | POA: Diagnosis not present

## 2021-07-15 NOTE — Telephone Encounter (Signed)
Pt aware ./cy 

## 2021-07-19 DIAGNOSIS — K219 Gastro-esophageal reflux disease without esophagitis: Secondary | ICD-10-CM | POA: Diagnosis not present

## 2021-07-19 DIAGNOSIS — H6123 Impacted cerumen, bilateral: Secondary | ICD-10-CM | POA: Diagnosis not present

## 2021-07-19 DIAGNOSIS — J310 Chronic rhinitis: Secondary | ICD-10-CM | POA: Diagnosis not present

## 2021-08-23 ENCOUNTER — Encounter (HOSPITAL_BASED_OUTPATIENT_CLINIC_OR_DEPARTMENT_OTHER): Payer: Self-pay | Admitting: Emergency Medicine

## 2021-08-23 ENCOUNTER — Other Ambulatory Visit: Payer: Self-pay

## 2021-08-23 ENCOUNTER — Telehealth: Payer: Self-pay | Admitting: Internal Medicine

## 2021-08-23 ENCOUNTER — Telehealth: Payer: Self-pay | Admitting: Cardiology

## 2021-08-23 ENCOUNTER — Emergency Department (HOSPITAL_BASED_OUTPATIENT_CLINIC_OR_DEPARTMENT_OTHER)
Admission: EM | Admit: 2021-08-23 | Discharge: 2021-08-24 | Disposition: A | Discharge status: Home / Self Care | Payer: BC Managed Care – PPO | Attending: Emergency Medicine | Admitting: Emergency Medicine

## 2021-08-23 DIAGNOSIS — Z79899 Other long term (current) drug therapy: Secondary | ICD-10-CM | POA: Insufficient documentation

## 2021-08-23 DIAGNOSIS — R202 Paresthesia of skin: Secondary | ICD-10-CM

## 2021-08-23 DIAGNOSIS — M79605 Pain in left leg: Secondary | ICD-10-CM | POA: Insufficient documentation

## 2021-08-23 DIAGNOSIS — R209 Unspecified disturbances of skin sensation: Secondary | ICD-10-CM | POA: Insufficient documentation

## 2021-08-23 NOTE — Telephone Encounter (Signed)
Pt called with tingling all over.  Has been occurring for 2 days.  Kept her from sleep.  No chest pain.  She felt her foot had this issue but now all over.  I asked her to go to urgent care to be evaluated.  I do not know what could be causing and she would be better served seeing someone.  She may need labs done.  I could not promise pt an OV tomorrow and we do not have walk ins.  She could call her PCP and also be instructed.    Triage please check on her 08/24/21.  Thank you

## 2021-08-23 NOTE — Telephone Encounter (Signed)
I was called by the patient for similar issue to earlier call. The patient was unable to get evaluated at urgent care prior to closure.  She was still complaining of numbness throughout her entire body worse after sleeping.  Was questioning whether this was related to her hypertension or any of her hypertension meds.  I discussed with her that given the inability to evaluate and without labs, it was impossible for me to tell over the phone.  Thus, I recommended that she come to the emergency department for evaluation.  I noted that given her change in symptoms over the last 24 hours, I would recommend emergency department evaluation and that I do not necessarily believe this is a cardiac issue.  Billey Chang, MD Cardiology

## 2021-08-23 NOTE — ED Triage Notes (Signed)
Patient c/o numbness and tingling in her right leg since yesterday. Reports she has had intermittent episodes in all extremities for the last 3 weeks.

## 2021-08-24 ENCOUNTER — Telehealth: Payer: Self-pay | Admitting: Family Medicine

## 2021-08-24 LAB — CBC WITH DIFFERENTIAL/PLATELET
Abs Immature Granulocytes: 0 10*3/uL (ref 0.00–0.07)
Basophils Absolute: 0 10*3/uL (ref 0.0–0.1)
Basophils Relative: 0 %
Eosinophils Absolute: 0.1 10*3/uL (ref 0.0–0.5)
Eosinophils Relative: 1 %
HCT: 36.9 % (ref 36.0–46.0)
Hemoglobin: 12.7 g/dL (ref 12.0–15.0)
Immature Granulocytes: 0 %
Lymphocytes Relative: 41 %
Lymphs Abs: 1.9 10*3/uL (ref 0.7–4.0)
MCH: 30 pg (ref 26.0–34.0)
MCHC: 34.4 g/dL (ref 30.0–36.0)
MCV: 87 fL (ref 80.0–100.0)
Monocytes Absolute: 0.4 10*3/uL (ref 0.1–1.0)
Monocytes Relative: 8 %
Neutro Abs: 2.3 10*3/uL (ref 1.7–7.7)
Neutrophils Relative %: 50 %
Platelets: 177 10*3/uL (ref 150–400)
RBC: 4.24 MIL/uL (ref 3.87–5.11)
RDW: 12.4 % (ref 11.5–15.5)
WBC: 4.7 10*3/uL (ref 4.0–10.5)
nRBC: 0 % (ref 0.0–0.2)

## 2021-08-24 LAB — COMPREHENSIVE METABOLIC PANEL
ALT: 18 U/L (ref 0–44)
AST: 20 U/L (ref 15–41)
Albumin: 4.3 g/dL (ref 3.5–5.0)
Alkaline Phosphatase: 65 U/L (ref 38–126)
Anion gap: 7 (ref 5–15)
BUN: 16 mg/dL (ref 6–20)
CO2: 29 mmol/L (ref 22–32)
Calcium: 9.8 mg/dL (ref 8.9–10.3)
Chloride: 103 mmol/L (ref 98–111)
Creatinine, Ser: 0.84 mg/dL (ref 0.44–1.00)
GFR, Estimated: >60 mL/min (ref >60)
Glucose, Bld: 108 mg/dL — ABNORMAL HIGH (ref 70–99)
Potassium: 4 mmol/L (ref 3.5–5.1)
Sodium: 139 mmol/L (ref 135–145)
Total Bilirubin: 0.6 mg/dL (ref 0.3–1.2)
Total Protein: 8.1 g/dL (ref 6.5–8.1)

## 2021-08-24 LAB — T4, FREE: Free T4: 0.92 ng/dL (ref 0.61–1.12)

## 2021-08-24 LAB — TSH: TSH: 2.601 u[IU]/mL (ref 0.350–4.500)

## 2021-08-24 LAB — MAGNESIUM: Magnesium: 2.2 mg/dL (ref 1.7–2.4)

## 2021-08-24 NOTE — Discharge Instructions (Signed)
The cause for your numbness is not clear.  Your potassium and magnesium levels are normal.  You need to have further evaluation by a neurologist.  Please follow-up with the neurologist for further evaluation and treatment.

## 2021-08-24 NOTE — Telephone Encounter (Signed)
Patient was seen in [facility] emergency room

## 2021-08-24 NOTE — ED Notes (Signed)
Patient came to secretary desk complaining about long wait for test result and wanting to ask clinical questions. Informed patient that I will have her assigned  nurse or charge nurse come to her room once someone was available. Patient become aggressive towards Network engineer.  Another RN was able to assist patient with answering her question but patient continue to aggressive towards staff.

## 2021-08-24 NOTE — ED Notes (Addendum)
Pt to secretary's desk to voice complaints regarding timing of test results. Pt extremely verbally aggressive to Network engineer and this Therapist, sports. Pt advised of timing of test results and ED flow process. Pt repeatedly stating the same information several times. Pt wanted to know why nobody has done an EKG, I explained to the pt that an EKG was for cardiac issues, and that if the provider had a concern for cardiac problems then he would have ordered. I am unsure at this time if the patient understood what I was saying as she kept backing away and continued to be verbally aggressive. Pt called this RN condescending and demanded my name which was provided to her as asked. CN made aware as was primary RN.

## 2021-08-24 NOTE — ED Provider Notes (Signed)
[facility] EMERGENCY DEPARTMENT Provider Note   CSN: [id number] Arrival date & time: 08/23/21  2328     History  Chief Complaint  Patient presents with   Leg Pain    Jenny Wright is a 56 y.o. female.  The history is provided by the patient.  Leg Pain She has history of positive antiphospholipid antibody and factor V Leiden mutation and comes in complaining of numbness in multiple locations.  This has been present for about a month and seems to be getting worse.  She started off with numbness in her left foot and leg, but it has spread to involve the right leg, right arm, forehead.  She also has had some mild numbness in the left arm.  She has not noticed any weakness and has not noticed any incoordination.  She does relate that she was started on hydrochlorothiazide last fall and had injured her left first toe in January and wonders if these might be related to her numbness.  Her foot doctor stated that it was not from her foot injury.   Home Medications Prior to Admission medications   Medication Sig Start Date End Date Taking? Authorizing Provider  cetirizine (ZYRTEC) 10 MG tablet Take 1 tablet (10 mg total) by mouth daily. 06/24/21   Libby Maw, MD  Cholecalciferol (VITAMIN D) 50 MCG (2000 UT) tablet Take 2,000 Units by mouth daily. 07/27/20   [provider]  CVS VITAMIN C 1000 MG tablet Take 2,000 mg by mouth daily. 07/27/20   [provider]  Ferrous Sulfate (IRON PO) Take by mouth daily.    [provider]  fluticasone (FLONASE) 50 MCG/ACT nasal spray Place 2 sprays into both nostrils daily. 06/24/21   Libby Maw, MD  hydrochlorothiazide (HYDRODIURIL) 25 MG tablet Take 0.5 tablets (12.5 mg total) by mouth daily. 06/14/21 09/12/21  Belva Crome, MD  Multiple Vitamin (MULTI-VITAMIN) tablet Take 1 tablet by mouth daily.    [provider]  zinc gluconate 50 MG tablet Take 50 mg by mouth daily. 07/27/20   [provider]      Allergies    Penicillins, Methylprednisolone, and Omeprazole    Review of Systems   Review of Systems  All other systems reviewed and are negative.  Physical Exam Updated Vital Signs BP 124/84 (BP Location: Right Arm)   Pulse 66   Temp 98.1 F (36.7 C) (Oral)   Resp 18   Ht '5\' 7"'$  (1.702 m)   Wt 69.4 kg   LMP 06/20/2017   SpO2 99%   BMI 23.96 kg/m  Physical Exam Vitals and nursing note reviewed.  56 year old female, resting comfortably and in no acute distress. Vital signs are normal. Oxygen saturation is 99%, which is normal. Head is normocephalic and atraumatic. PERRLA, EOMI. moderate lid lag is noted. Neck is nontender and supple without adenopathy or JVD. Back is nontender and there is no CVA tenderness. Lungs are clear without rales, wheezes, or rhonchi. Chest is nontender. Heart has regular rate and rhythm without murmur. Abdomen is soft, flat, nontender. Extremities have no cyanosis or edema, full range of motion is present. Skin is warm and dry without rash. Neurologic: Mental status is normal, cranial nerves are intact.  Strength is 5/5 and all 4 extremities.  There is no pronator drift.  Sensory exam is normal including in areas that she has subjective numbness.  There is no extinction on double simultaneous stimulation.  ED Results / Procedures /  Treatments   Labs (all labs ordered are listed, but only abnormal results are displayed) Labs Reviewed  COMPREHENSIVE METABOLIC PANEL - Abnormal; Notable for the following components:      Result Value   Glucose, Bld 108 (*)    All other components within normal limits  CBC WITH DIFFERENTIAL/PLATELET  MAGNESIUM  TSH  T4, FREE    Procedures Procedures    Medications Ordered in ED Medications - No data to display  ED Course/ Medical Decision Making/ A&P                           Medical Decision Making Amount and/or Complexity of Data Reviewed Labs: ordered.   Paresthesias of  uncertain cause.  Consider electrolyte disturbance given that the patient is on a diuretic.  That leg is concerning for possible occult hyperthyroidism.  No evidence of stroke.  Patient's age is atypical for onset, but consider multiple sclerosis.  I have ordered screening labs including CBC, comprehensive metabolic panel, magnesium, TSH, free T4.  I have reviewed interpreted all of the laboratory tests and there are no including magnesium, calcium, and potassium.  She will need referral to neurology for further evaluation.  Of note, she has a rod in her back and likely cannot have MRI scans.  She is discharged with an ambulatory referral to neurology.  Final Clinical Impression(s) / ED Diagnoses Final diagnoses:  Paresthesias    Rx / DC Orders ED Discharge Orders          Ordered    Ambulatory referral to Neurology       Comments: An appointment is requested in approximately: 1 week   08/24/21 4097              Delora Fuel, MD 35/32/99 [phone]

## 2021-08-24 NOTE — ED Notes (Signed)
Pt verbalizes understanding of discharge instructions. Opportunity for questioning and answers were provided. Pt discharged from ED to home.   ? ?

## 2021-08-24 NOTE — Telephone Encounter (Signed)
Pt want to know if you can sign off annual physical ( health care acknowledge paperwork)pt stated that she not hat  you did not do her physical but do you feel comfortable signing the paper work. Pt said you can call her

## 2021-08-24 NOTE — Telephone Encounter (Signed)
Returned patients call per patient she was able to contact her previous provider for the form.

## 2021-08-30 DIAGNOSIS — M79671 Pain in right foot: Secondary | ICD-10-CM | POA: Diagnosis not present

## 2021-08-30 DIAGNOSIS — B351 Tinea unguium: Secondary | ICD-10-CM | POA: Diagnosis not present

## 2021-08-30 DIAGNOSIS — M79672 Pain in left foot: Secondary | ICD-10-CM | POA: Diagnosis not present

## 2021-09-01 DIAGNOSIS — R8271 Bacteriuria: Secondary | ICD-10-CM | POA: Diagnosis not present

## 2021-09-05 ENCOUNTER — Telehealth: Payer: Self-pay | Admitting: Internal Medicine

## 2021-09-05 NOTE — Telephone Encounter (Signed)
Patient describes the onset of a "cramping in her chest" and tingling in her legs and hands. She states that she has trouble describing it. Started approximately 3 weeks ago and seems to be worse at night. Of note, she has had similar reports of tingling throughout her body over the past several months. She is calling tonight for reassurance as she is nervous about the symptoms. We spent 16 minutes discussing her symptoms. I do not think that she needs to be evaluated emergently for this. I recommended that she make an appointment to see her PCP and/or cardiology in the outpatient setting. Questions were answered. ED precautions given.

## 2021-09-06 ENCOUNTER — Ambulatory Visit: Payer: BC Managed Care – PPO | Admitting: Family Medicine

## 2021-09-06 ENCOUNTER — Inpatient Hospital Stay: Payer: BC Managed Care – PPO | Admitting: Family Medicine

## 2021-09-06 ENCOUNTER — Encounter: Payer: Self-pay | Admitting: Family Medicine

## 2021-09-06 VITALS — BP 124/70 | HR 74 | Temp 97.5°F | Wt 153.0 lb

## 2021-09-06 DIAGNOSIS — R7303 Prediabetes: Secondary | ICD-10-CM | POA: Diagnosis not present

## 2021-09-06 DIAGNOSIS — R202 Paresthesia of skin: Secondary | ICD-10-CM

## 2021-09-06 MED ORDER — BLOOD GLUCOSE MONITOR KIT: PACK | 0 refills | Status: AC

## 2021-09-06 NOTE — Progress Notes (Signed)
Established Patient Office Visit  Subjective   Patient ID: Jenny Wright, female    DOB: 12/01/1965  Age: 56 y.o. MRN: [id number]  Chief Complaint  Patient presents with   Hospitalization [facility] follow up, discuss possible diabetes.     HPI for follow-up of possible prediabetes.  Last hemoglobin A1c was 6.  She has no history of gestational diabetes.  Denies frequency of urination.  There has been some weight loss.  Blood sugars have been slightly elevated with routine labs.  More recently has developed paresthesias about her body.  These started in the left foot (which were recently at her crush injury from which she is recovering).  They are now also in her right foot and leg for pends interface.  She has had no blurred vision or diplopia.  She did have a strange sensation in her right anterior neck area thought to be a spot possible muscle spasm.  No difficulty swallowing.  No headaches.    Review of Systems  Constitutional:  Negative for chills, diaphoresis, malaise/fatigue and weight loss.  HENT: Negative.    Eyes: Negative.  Negative for blurred vision and double vision.  Cardiovascular:  Negative for chest pain.  Gastrointestinal:  Negative for abdominal pain.  Genitourinary: Negative.  Negative for dysuria, frequency and urgency.  Musculoskeletal:  Negative for falls and myalgias.  Neurological:  Negative for speech change, loss of consciousness and weakness.  Psychiatric/Behavioral: Negative.        Objective:     BP 124/70 (BP Location: Right Arm, Patient Position: Sitting, Cuff Size: Normal)   Pulse 74   Temp (!) 97.5 F (36.4 C) (Temporal)   Wt 153 lb (69.4 kg)   LMP 06/20/2017   SpO2 97%   BMI 23.96 kg/m  Wt Readings from Last 3 Encounters:  09/06/21 153 lb (69.4 kg)  08/23/21 153 lb (69.4 kg)  06/24/21 156 lb 3.2 oz (70.9 kg)      Physical Exam Constitutional:      General: She is not in acute distress.    Appearance: Normal  appearance. She is not ill-appearing, toxic-appearing or diaphoretic.  HENT:     Head: Normocephalic and atraumatic.     Right Ear: External ear normal.     Left Ear: External ear normal.     Mouth/Throat:     Mouth: Mucous membranes are moist.     Pharynx: Oropharynx is clear. No oropharyngeal exudate or posterior oropharyngeal erythema.  Eyes:     General: No visual field deficit or scleral icterus.       Right eye: No discharge.        Left eye: No discharge.     Extraocular Movements: Extraocular movements intact.     Conjunctiva/sclera: Conjunctivae normal.     Pupils: Pupils are equal, round, and reactive to light.  Cardiovascular:     Rate and Rhythm: Normal rate and regular rhythm.  Pulmonary:     Effort: Pulmonary effort is normal. No respiratory distress.     Breath sounds: Normal breath sounds.  Abdominal:     General: Bowel sounds are normal.     Tenderness: There is no abdominal tenderness. There is no guarding.  Musculoskeletal:     Cervical back: No rigidity or tenderness.  Skin:    General: Skin is warm and dry.  Neurological:     Mental Status: She is alert and oriented to person, place, and time.     Cranial Nerves: No  cranial nerve deficit, dysarthria or facial asymmetry.     Motor: No weakness.  Psychiatric:        Mood and Affect: Mood normal.        Behavior: Behavior normal.      No results found for any visits on 09/06/21.    The ASCVD Risk score (Arnett DK, et al., 2019) failed to calculate for the following reasons:   Cannot find a previous HDL lab   Cannot find a previous total cholesterol lab    Assessment & Plan:   Problem List Items Addressed This Visit       Other   Prediabetes   Relevant Medications   blood glucose meter kit and supplies KIT   Paresthesias - Primary   Relevant Orders   Vitamin B12    Return in about 2 months (around 11/06/2021).  Will see urology in July and follow-up with me after that.  Concerned about the  possibility of MS.  We will check random fasting blood sugars with her glucometer.  Information was given glucose monitoring.  Spent over 30 minutes with this patient discussing her symptoms and possible diagnoses. Libby Maw, MD

## 2021-09-07 ENCOUNTER — Telehealth: Payer: Self-pay | Admitting: Interventional Cardiology

## 2021-09-07 ENCOUNTER — Ambulatory Visit: Payer: Self-pay

## 2021-09-07 ENCOUNTER — Telehealth: Payer: Self-pay | Admitting: Family Medicine

## 2021-09-07 ENCOUNTER — Telehealth: Payer: Self-pay | Admitting: Physician Assistant

## 2021-09-07 DIAGNOSIS — R0609 Other forms of dyspnea: Secondary | ICD-10-CM

## 2021-09-07 DIAGNOSIS — I10 Essential (primary) hypertension: Secondary | ICD-10-CM

## 2021-09-07 LAB — VITAMIN B12: Vitamin B-12: 377 pg/mL (ref 211–911)

## 2021-09-07 NOTE — Telephone Encounter (Signed)
Spoke with patient who reports about a week and a half ago she started having numbness and tingling in legs. She reports now she has this sensation in her arms and legs. She reports she feels weak, "I could barely get out of bed, my arms and legs feel like jelly."  Patient saw PCP yesterday and had labs drawn. Patient called Dr. Bebe Shaggy office to request add of for Potassium and Vitamin D level. I also called Dr. Bebe Shaggy office after getting off phone with patient and left message requesting add on for kidney function (BMET) due to patient describing symptoms that sound like dehydration.  Patient states she is drinking 3-4 16.9oz bottles of water daily, and endorses she tends to not drink enough fluids and has had issues with dehydration in the past.   Patient reports BP 120's over 70's.  Patient is concerned HCTZ is causing her to become dehydrated/depleted of electrolytes.   Will forward to Pharm D and Dr. Tamala Julian to review and advise.

## 2021-09-07 NOTE — Telephone Encounter (Signed)
Pt is also wanting to be checked for anemia. She would like these orders placed, we can call her back and schedule a lab or OV app if needed. [phone]

## 2021-09-07 NOTE — Telephone Encounter (Addendum)
   Pt called because she has multiple pains  She has had upper chest pain, associated w/ difficulty swallowing at times.  The upper chest pain has resolved.  But she is very concerned about this.  I explained that she had a 0 calcium score 10/2020.  I explained that the likelihood she has obstructive coronary artery disease less than a year after a negative calcium score is extremely low.  She has not had exertional symptoms and her symptoms have resolved.  She has been extremely weak today.  She does not have batteries for her blood pressure cuff so has not been able to check her blood pressure and is not able to check her pulse.  At this time, she seems to be feeling a little bit better, so is going to rest at home for a while and see how she feels.  She also has had numbness in multiple locations, for which she was seen in the ER on 08/24/2021.  This has been present for about a month and seems to be getting worse.  She started off with numbness in her left foot and leg, but it has spread to involve the right leg, right arm, forehead.  She also has had some mild numbness in the left arm.  She has not noticed any weakness and has not noticed any incoordination.   She was concerned that the numbness was from the HCTZ.  However, she did not have any obvious neural deficits according to the ER MD.  She also had multiple labs done that showed no significant abnormality.  Her vitamin B12 that was done yesterday by her PCP was also within normal limits.  I reviewed all of her labs with her and reassured her on the results.  I explained that I do not have a cause for her symptoms.  If her symptoms continue or get worse, and she feels she needs to come to the emergency room, that is up to her.  For now, she is okay staying at home and monitoring her condition carefully.  I will route this to Dr. Tamala Julian, her PCP, and triage at [facility]. I requested that someone from Triage to give her a call tomorrow  and make sure she is doing okay.  Rosaria Ferries, PA-C 09/07/2021 7:27 PM

## 2021-09-07 NOTE — Telephone Encounter (Signed)
Can dr Ethelene Hal check kidney function work that was drawn from yesterday the patient is complaining of symptoms hurting   Jenny Wright [phone] please call if any questions

## 2021-09-07 NOTE — Telephone Encounter (Signed)
Pt is wanting her neurology referral sent to another provider, the one we sent her to [phone] can not see her until 10/14/21. She is very distressed over this.

## 2021-09-07 NOTE — Telephone Encounter (Signed)
    Chief Complaint: Pt. Calling to review labs done in ED again. Verbalizes understanding. Reports she remains "extremely weak and I need to get better." Symptoms: Weak Frequency: 08/23/21 Pertinent Negatives: Patient denies  Disposition: '[]'$ ED /'[]'$ Urgent Care (no appt availability in office) / '[]'$ Appointment(In office/virtual)/ '[]'$  [place] [facility]/ '[]'$ Home Care/ '[]'$ Refused Recommended Disposition /'[]'$ [place] [facility]/ '[x]'$  Follow-up with PCP Additional Notes: States she has "put a call in to my doctor as well."  Answer Assessment - Initial Assessment Questions 1. DESCRIPTION: "Describe how you are feeling."     Extremely weak 2. SEVERITY: "How bad is it?"  "Can you stand and walk?"   - MILD - Feels weak or tired, but does not interfere with work, school or normal activities   - [place] to stand and walk; weakness interferes with work, school, or normal activities   - SEVERE - Unable to stand or walk     Moderate 3. ONSET:  "When did the weakness begin?"     08/23/21 4. CAUSE: "What do you think is causing the weakness?"     Unsure 5. MEDICINES: "Have you recently started a new medicine or had a change in the amount of a medicine?"     Yes 6. OTHER SYMPTOMS: "Do you have any other symptoms?" (e.g., chest pain, fever, cough, SOB, vomiting, diarrhea, bleeding, other areas of pain)     No 7. PREGNANCY: "Is there any chance you are pregnant?" "When was your last menstrual period?"     no  Protocols used: Weakness (Generalized) and Fatigue-A-AH

## 2021-09-07 NOTE — Telephone Encounter (Signed)
Pt c/o medication issue:  1. Name of Medication:  hydrochlorothiazide (HYDRODIURIL) 25 MG tablet  2. How are you currently taking this medication (dosage and times per day)? As prescribed   3. Are you having a reaction (difficulty breathing--STAT)? Yes  4. What is your medication issue? Patient is calling stating that for the past two weeks she has been having tingling all over. As of yesterday she started feeling slightly weak and today the weakness is to the point she can barely lift her arms and legs. She reports she spoke with an after hours provider who had told her that they had never heard of this being a reaction to this medication and advised she f/u with her PCP. Patient did this and had labs performed by Dr. Ethelene Hal. Patient also spoke with her pharmacy who advised it is rare, but possible that this medication is the cause. Patient is wanting to know if Dr. Tamala Julian has ever heard of this occurring in patients. Advised patient Dr. Tamala Julian is not in office today. Patient is requesting a callback from the nurse with a plan in the meantime. Please advise.

## 2021-09-07 NOTE — Telephone Encounter (Signed)
Please advise message below, patient calling to see if she could have Potassium and Vit. D checked she would also like to know if she is anemic?

## 2021-09-07 NOTE — Telephone Encounter (Signed)
Pt stated she is feeling weak and lethargic and wonders if her Vit D and Potassium were checked. She feels this would help if they are low.

## 2021-09-08 NOTE — Telephone Encounter (Signed)
Pt is calling back about scheduling for labs. Requesting call.  Pt states she has a break from 3:30-4 p.m. today and to please call her back at that time.

## 2021-09-08 NOTE — Telephone Encounter (Signed)
Duplicate message advised follow up visit for continued and new symptoms per patient she will call us back to schedule if needed due to having to hang up for meeting.

## 2021-09-08 NOTE — Telephone Encounter (Signed)
Labs (K, Na, scr) were normal two weeks ago. Would need to see updated labs to see if this is the issue. BMP would be needed to assess this. Would also check Magnesium.  HCTZ doesn't cause numbness

## 2021-09-08 NOTE — Telephone Encounter (Signed)
Patient called on-call provider last night. See above telephone encounter.

## 2021-09-08 NOTE — Addendum Note (Signed)
Addended by: Antonieta Iba on: 09/08/2021 03:43 PM   Modules accepted: Orders

## 2021-09-08 NOTE — Telephone Encounter (Signed)
Spoke with the patient who states that she is feeling okay today. She is not having any current chest pain. Gave advisement from PharmD in regards to HCTZ not likely causing her symptoms. She states that she will call back about lab work.

## 2021-09-08 NOTE — Telephone Encounter (Signed)
Called patient and scheduled lab work. Patient had previously requested a vitamin D level be checked at her PCP but this was not done. She is wondering if this can be added on.

## 2021-09-08 NOTE — Telephone Encounter (Signed)
Ramond Dial, RPH-CPP   09/08/21  8:35 AM Note Labs (K, Na, scr) were normal two weeks ago. Would need to see updated labs to see if this is the issue. BMP would be needed to assess this. Would also check Magnesium.  HCTZ doesn't cause numbness

## 2021-09-08 NOTE — Telephone Encounter (Signed)
Patient aware of message below.

## 2021-09-08 NOTE — Telephone Encounter (Signed)
Attempted to call patient. Unable to leave voicemail.  

## 2021-09-09 NOTE — Telephone Encounter (Signed)
Called and spoke with pt, she understood. She is planning on staying where she was referred.

## 2021-09-10 ENCOUNTER — Other Ambulatory Visit: Payer: BC Managed Care – PPO

## 2021-09-10 DIAGNOSIS — R0609 Other forms of dyspnea: Secondary | ICD-10-CM

## 2021-09-10 DIAGNOSIS — I10 Essential (primary) hypertension: Secondary | ICD-10-CM

## 2021-09-10 LAB — MAGNESIUM: Magnesium: 2.1 mg/dL (ref 1.6–2.3)

## 2021-09-10 LAB — BASIC METABOLIC PANEL
BUN/Creatinine Ratio: 15 (ref 9–23)
BUN: 12 mg/dL (ref 6–24)
CO2: 26 mmol/L (ref 20–29)
Calcium: 9.4 mg/dL (ref 8.7–10.2)
Chloride: 102 mmol/L (ref 96–106)
Creatinine, Ser: 0.79 mg/dL (ref 0.57–1.00)
Glucose: 96 mg/dL (ref 70–99)
Potassium: 3.9 mmol/L (ref 3.5–5.2)
Sodium: 139 mmol/L (ref 134–144)
eGFR: 88 mL/min/{1.73_m2} (ref >59)

## 2021-09-12 ENCOUNTER — Ambulatory Visit (HOSPITAL_COMMUNITY)
Admission: EM | Admit: 2021-09-12 | Discharge: 2021-09-12 | Disposition: A | Discharge status: Home / Self Care | Payer: BC Managed Care – PPO

## 2021-09-12 ENCOUNTER — Encounter (HOSPITAL_COMMUNITY): Payer: Self-pay

## 2021-09-12 DIAGNOSIS — R200 Anesthesia of skin: Secondary | ICD-10-CM

## 2021-09-12 DIAGNOSIS — R202 Paresthesia of skin: Secondary | ICD-10-CM

## 2021-09-12 LAB — CBG MONITORING, ED: Glucose-Capillary: 94 mg/dL (ref 70–99)

## 2021-09-12 MED ORDER — ERYTHROMYCIN 5 MG/GM OP OINT
TOPICAL_OINTMENT | OPHTHALMIC | 0 refills | Status: DC
Start: 2021-09-12 — End: 2021-09-12

## 2021-09-13 ENCOUNTER — Telehealth: Payer: Self-pay | Admitting: Interventional Cardiology

## 2021-09-14 ENCOUNTER — Encounter: Payer: Self-pay | Admitting: Neurology

## 2021-09-14 ENCOUNTER — Ambulatory Visit: Payer: BC Managed Care – PPO | Admitting: Neurology

## 2021-09-14 VITALS — BP 118/71 | HR 80 | Ht 67.0 in | Wt 152.5 lb

## 2021-09-14 DIAGNOSIS — G5792 Unspecified mononeuropathy of left lower limb: Secondary | ICD-10-CM | POA: Diagnosis not present

## 2021-09-14 DIAGNOSIS — M79672 Pain in left foot: Secondary | ICD-10-CM

## 2021-09-14 DIAGNOSIS — R202 Paresthesia of skin: Secondary | ICD-10-CM

## 2021-09-14 DIAGNOSIS — M4120 Other idiopathic scoliosis, site unspecified: Secondary | ICD-10-CM

## 2021-09-14 DIAGNOSIS — B351 Tinea unguium: Secondary | ICD-10-CM | POA: Diagnosis not present

## 2021-09-14 MED ORDER — DICLOFENAC SODIUM 1 % EX GEL: CUTANEOUS | 3 refills | Status: AC

## 2021-09-15 ENCOUNTER — Ambulatory Visit
Admission: RE | Admit: 2021-09-15 | Discharge: 2021-09-15 | Disposition: A | Discharge status: Home / Self Care | Payer: BC Managed Care – PPO | Source: Ambulatory Visit | Attending: Neurology | Admitting: Neurology

## 2021-09-15 DIAGNOSIS — M419 Scoliosis, unspecified: Secondary | ICD-10-CM | POA: Diagnosis not present

## 2021-09-15 DIAGNOSIS — M5136 Other intervertebral disc degeneration, lumbar region: Secondary | ICD-10-CM | POA: Diagnosis not present

## 2021-09-15 DIAGNOSIS — R202 Paresthesia of skin: Secondary | ICD-10-CM

## 2021-09-15 NOTE — Progress Notes (Signed)
Cardiology Office Note:    Date:  09/15/2021   ID:  Jenny Wright, DOB 1965/09/19, MRN [id number]  PCP:  Libby Maw, MD  Cardiologist:  Sinclair Grooms, MD   Referring MD: Libby Maw   No chief complaint on file.   History of Present Illness:    Jenny Wright is a 56 y.o. female with a hx of  labile hypertension, COVID19 07/2020, Factor V Leiden mutation complicating pregnancy as well as preeclampsia, mild overweight, and DOE.   Having numbness and tingling all over her body and feels it may be related to hydrochlorothiazide.  We asked her to discontinue the medication.  She feels that she has a heart patient but we really find no significant abnormalities on exam or by prior echo done outside of our institution but performed as recently as June 2022.  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 Last Assessment & Plan:  Discussed possible hematology consult in future for more definitive  dx.   Throat irritation 03/19/2019   Tinea pedis 08/29/2012   Uterine fibroid 02/13/2012   Uterine leiomyoma 02/13/2012    Past Surgical History:  Procedure Laterality Date   BACK SURGERY     CESAREAN SECTION     harrington rod  1980    Current Medications: No outpatient medications have been marked as taking for the 09/17/21 encounter (Appointment) with Belva Crome, MD.     Allergies:   Penicillins, Hydrochlorothiazide, Methylprednisolone, and Omeprazole   Social History   Socioeconomic History   Marital status: Divorced    Spouse name: Not on file   Number of children: Not on file   Years of education: Not on file   Highest education level: Not on file  Occupational History   Not on file  Tobacco Use   Smoking status: Never   Smokeless tobacco: Never  Vaping Use   Vaping Use: Never used  Substance and Sexual Activity   Alcohol use: No   Drug use: No   Sexual activity: Not Currently    Birth control/protection: None  Other Topics Concern   Not on file  Social History Narrative   Not on file   Social Determinants of Health   Financial Resource Strain: Not on file  Food Insecurity: Not on file  Transportation Needs: Not on file  Physical Activity: Not on file  Stress: Not on file  Social Connections: Not on file     Family History: The patient's family history includes Breast cancer in her mother; Heart disease in  her father; Lung cancer in her brother.  ROS:   Please see the history of present illness.    She has significant anxiety about her health.  She was told she had factor V Leiden that was a factor in her losing a child. 1 All other systems reviewed and are negative.  EKGs/Labs/Other Studies Reviewed:    The following studies were reviewed today: ECHO 08/2020: Mild concentric hypertrophy with EF >55%. No mention of diastolic function.  EKG:  EKG an EKG performed in June 2022 and November 2022 are both normal, demonstrating poor R wave  progression.  Echo however demonstrated normal wall motion.  Recent Labs: 08/24/2021: ALT 18; Hemoglobin 12.7; Platelets 177; TSH 2.601 09/10/2021: BUN 12; Creatinine, Ser 0.79; Magnesium 2.1; Potassium 3.9; Sodium 139  Recent Lipid Panel    Component Value Date/Time   LDLDIRECT 120.0 06/10/2021 0755    Physical Exam:    VS:  LMP 06/20/2017     Wt Readings from Last 3 Encounters:  09/14/21 152 lb 8 oz (69.2 kg)  09/06/21 153 lb (69.4 kg)  08/23/21 153 lb (69.4 kg)     GEN: Mast. No acute distress HEENT: Normal NECK: No JVD. LYMPHATICS: No lymphadenopathy CARDIAC: No murmur murmur. RRR no gallop, or edema. VASCULAR:  Normal Pulses. No bruits. RESPIRATORY:  Clear to auscultation without rales, wheezing or rhonchi  ABDOMEN: Soft, non-tender, non-distended, No pulsatile mass, MUSCULOSKELETAL: No deformity  SKIN: Warm and dry NEUROLOGIC:  Alert and oriented x 3 PSYCHIATRIC:  Normal affect   ASSESSMENT:    1. Essential hypertension   2. Dyspnea on exertion   3. Factor V Leiden mutation complicating pregnancy ([place])    PLAN:    In order of problems listed above:  Normal blood pressure off hydrochlorothiazide.  I have recommended that she monitor her blood pressure intermittently and if she runs pressures above 140/90 on a consistent basis, she will need her primary care physician to start antihypertensive therapy. The dyspnea that she was having after COVID has resolved.   Medication Adjustments/Labs and Tests Ordered: Current medicines are reviewed at length with the patient today.  Concerns regarding medicines are outlined above.  No orders of the defined types were placed in this encounter.  No orders of the defined types were placed in this encounter.   There are no Patient Instructions on file for this visit.   Signed, Sinclair Grooms, MD  09/15/2021 4:09 PM    [facility]

## 2021-09-16 ENCOUNTER — Telehealth: Payer: Self-pay | Admitting: *Deleted

## 2021-09-16 NOTE — Telephone Encounter (Signed)
Attempted to reach the pt and discuss results.  Pt was not available and VM was full. Will try again at a later time.

## 2021-09-16 NOTE — Telephone Encounter (Signed)
I attempted to call the pt but VM was full and not accepting new messages.  Will try again at a later time.

## 2021-09-16 NOTE — Telephone Encounter (Signed)
-----   Message from Marcial Pacas, MD sent at 09/16/2021  8:42 AM EDT ----- Please call patient, x-ray of lumbar spine showed moderate lumbar levoscoliosis with Harrington rod in place, diffuse degenerative disc disease.

## 2021-09-16 NOTE — Telephone Encounter (Signed)
-----   Message from Marcial Pacas, MD sent at 09/16/2021  8:41 AM EDT ----- Please call patient, x-ray of cervical spine showed multilevel cervical degenerative disease, most marked at C4-5, C5-6.  No evidence of acute abnormality

## 2021-09-17 ENCOUNTER — Ambulatory Visit: Payer: BC Managed Care – PPO | Admitting: Interventional Cardiology

## 2021-09-17 ENCOUNTER — Encounter: Payer: Self-pay | Admitting: Interventional Cardiology

## 2021-09-17 VITALS — BP 114/58 | HR 66 | Ht 67.0 in | Wt 153.2 lb

## 2021-09-17 DIAGNOSIS — O99119 Other diseases of the blood and blood-forming organs and certain disorders involving the immune mechanism complicating pregnancy, unspecified trimester: Secondary | ICD-10-CM

## 2021-09-17 DIAGNOSIS — I10 Essential (primary) hypertension: Secondary | ICD-10-CM

## 2021-09-17 DIAGNOSIS — D6851 Activated protein C resistance: Secondary | ICD-10-CM | POA: Diagnosis not present

## 2021-09-17 DIAGNOSIS — R0609 Other forms of dyspnea: Secondary | ICD-10-CM | POA: Diagnosis not present

## 2021-09-17 NOTE — Patient Instructions (Signed)
Medication Instructions:  Your physician recommends that you continue on your current medications as directed. Please refer to the Current Medication list given to you today.  *If you need a refill on your cardiac medications before your next appointment, please call your pharmacy*  Lab Work: NONE  Testing/Procedures: NONE  Other Instructions Check your blood pressure 5-7 days per week. If you are getting blood pressure readings consistently higher than 140/90 contact your primary care provider to discuss antihypertensive therapy.  DASH Eating Plan DASH stands for Dietary Approaches to Stop Hypertension. The DASH eating plan is a healthy eating plan that has been shown to: Reduce high blood pressure (hypertension). Reduce your risk for type 2 diabetes, heart disease, and stroke. Help with weight loss. What are tips for following this plan? Reading food labels Check food labels for the amount of salt (sodium) per serving. Choose foods with less than 5 percent of the Daily Value of sodium. Generally, foods with less than 300 milligrams (mg) of sodium per serving fit into this eating plan. To find whole grains, look for the word "whole" as the first word in the ingredient list. Shopping Buy products labeled as "low-sodium" or "no salt added." Buy fresh foods. Avoid canned foods and pre-made or frozen meals. Cooking Avoid adding salt when cooking. Use salt-free seasonings or herbs instead of table salt or sea salt. Check with your health care provider or pharmacist before using salt substitutes. Do not fry foods. Cook foods using healthy methods such as baking, boiling, grilling, roasting, and broiling instead. Cook with heart-healthy oils, such as olive, canola, avocado, soybean, or sunflower oil. Meal planning  Eat a balanced diet that includes: 4 or more servings of fruits and 4 or more servings of vegetables each day. Try to fill one-half of your plate with fruits and  vegetables. 6-8 servings of whole grains each day. Less than 6 oz (170 g) of lean meat, poultry, or fish each day. A 3-oz (85-g) serving of meat is about the same size as a deck of cards. One egg equals 1 oz (28 g). 2-3 servings of low-fat dairy each day. One serving is 1 cup (237 mL). 1 serving of nuts, seeds, or beans 5 times each week. 2-3 servings of heart-healthy fats. Healthy fats called omega-3 fatty acids are found in foods such as walnuts, flaxseeds, fortified milks, and eggs. These fats are also found in cold-water fish, such as sardines, salmon, and mackerel. Limit how much you eat of: Canned or prepackaged foods. Food that is high in trans fat, such as some fried foods. Food that is high in saturated fat, such as fatty meat. Desserts and other sweets, sugary drinks, and other foods with added sugar. Full-fat dairy products. Do not salt foods before eating. Do not eat more than 4 egg yolks a week. Try to eat at least 2 vegetarian meals a week. Eat more home-cooked food and less restaurant, buffet, and fast food. Lifestyle When eating at a restaurant, ask that your food be prepared with less salt or no salt, if possible. If you drink alcohol: Limit how much you use to: 0-1 drink a day for women who are not pregnant. 0-2 drinks a day for men. Be aware of how much alcohol is in your drink. In the [place] one drink equals one 12 oz bottle of beer (355 mL), one 5 oz glass of wine (148 mL), or one 1 oz glass of hard liquor (44 mL). General information Avoid eating more than 2,300  mg of salt a day. If you have hypertension, you may need to reduce your sodium intake to 1,500 mg a day. Work with your health care provider to maintain a healthy body weight or to lose weight. Ask what an ideal weight is for you. Get at least 30 minutes of exercise that causes your heart to beat faster (aerobic exercise) most days of the week. Activities may include walking, swimming, or biking. Work with  your health care provider or dietitian to adjust your eating plan to your individual calorie needs. What foods should I eat? Fruits All fresh, dried, or frozen fruit. Canned fruit in natural juice (without added sugar). Vegetables Fresh or frozen vegetables (raw, steamed, roasted, or grilled). Low-sodium or reduced-sodium tomato and vegetable juice. Low-sodium or reduced-sodium tomato sauce and tomato paste. Low-sodium or reduced-sodium canned vegetables. Grains Whole-grain or whole-wheat bread. Whole-grain or whole-wheat pasta. Brown rice. [place]. Bulgur. Whole-grain and low-sodium cereals. Pita bread. Low-fat, low-sodium crackers. Whole-wheat flour tortillas. Meats and other proteins Skinless chicken or [place]. Ground chicken or [place]. Pork with fat trimmed off. Fish and seafood. Egg whites. Dried beans, peas, or lentils. Unsalted nuts, nut butters, and seeds. Unsalted canned beans. Lean cuts of beef with fat trimmed off. Low-sodium, lean precooked or cured meat, such as sausages or meat loaves. Dairy Low-fat (1%) or fat-free (skim) milk. Reduced-fat, low-fat, or fat-free cheeses. Nonfat, low-sodium ricotta or cottage cheese. Low-fat or nonfat yogurt. Low-fat, low-sodium cheese. Fats and oils Soft margarine without trans fats. Vegetable oil. Reduced-fat, low-fat, or light mayonnaise and salad dressings (reduced-sodium). Canola, safflower, olive, avocado, soybean, and sunflower oils. Avocado. Seasonings and condiments Herbs. Spices. Seasoning mixes without salt. Other foods Unsalted popcorn and pretzels. Fat-free sweets. The items listed above may not be a complete list of foods and beverages you can eat. Contact a dietitian for more information. What foods should I avoid? Fruits Canned fruit in a light or heavy syrup. Fried fruit. Fruit in cream or butter sauce. Vegetables Creamed or fried vegetables. Vegetables in a cheese sauce. Regular canned vegetables (not low-sodium or  reduced-sodium). Regular canned tomato sauce and paste (not low-sodium or reduced-sodium). Regular tomato and vegetable juice (not low-sodium or reduced-sodium). Angie Fava. Olives. Grains Baked goods made with fat, such as croissants, muffins, or some breads. Dry pasta or rice meal packs. Meats and other proteins Fatty cuts of meat. Ribs. Fried meat. Berniece Salines. Bologna, salami, and other precooked or cured meats, such as sausages or meat loaves. Fat from the back of a pig (fatback). Bratwurst. Salted nuts and seeds. Canned beans with added salt. Canned or smoked fish. Whole eggs or egg yolks. Chicken or [place] with skin. Dairy Whole or 2% milk, cream, and half-and-half. Whole or full-fat cream cheese. Whole-fat or sweetened yogurt. Full-fat cheese. Nondairy creamers. Whipped toppings. Processed cheese and cheese spreads. Fats and oils Butter. Stick margarine. Lard. Shortening. Ghee. Bacon fat. Tropical oils, such as coconut, palm kernel, or palm oil. Seasonings and condiments Onion salt, garlic salt, seasoned salt, table salt, and sea salt. Worcestershire sauce. Tartar sauce. Barbecue sauce. Teriyaki sauce. Soy sauce, including reduced-sodium. Steak sauce. Canned and packaged gravies. Fish sauce. Oyster sauce. Cocktail sauce. Store-bought horseradish. Ketchup. Mustard. Meat flavorings and tenderizers. Bouillon cubes. Hot sauces. Pre-made or packaged marinades. Pre-made or packaged taco seasonings. Relishes. Regular salad dressings. Other foods Salted popcorn and pretzels. The items listed above may not be a complete list of foods and beverages you should avoid. Contact a dietitian for more information. Where to find  more information National Heart, Lung, and Blood Institute: https://wilson-eaton.com/ American Heart Association: www.heart.org Academy of Nutrition and Dietetics: www.eatright.[place]: www.kidney.org Summary The DASH eating plan is a healthy eating plan that has been shown  to reduce high blood pressure (hypertension). It may also reduce your risk for type 2 diabetes, heart disease, and stroke. When on the DASH eating plan, aim to eat more fresh fruits and vegetables, whole grains, lean proteins, low-fat dairy, and heart-healthy fats. With the DASH eating plan, you should limit salt (sodium) intake to 2,300 mg a day. If you have hypertension, you may need to reduce your sodium intake to 1,500 mg a day. Work with your health care provider or dietitian to adjust your eating plan to your individual calorie needs. This information is not intended to replace advice given to you by your health care provider. Make sure you discuss any questions you have with your health care provider. Document Revised: 02/08/2019 Document Reviewed: 02/08/2019 Elsevier Patient Education  [place]

## 2021-09-22 NOTE — Telephone Encounter (Signed)
Attempted to reach the pt and discuss results.  Pt was not available and VM was full. Will try again at a later time

## 2021-09-22 NOTE — Telephone Encounter (Signed)
Attempted to reach the pt and discuss results.  Pt was not available and VM was full. Will try again at a later time.

## 2021-09-29 DIAGNOSIS — M542 Cervicalgia: Secondary | ICD-10-CM | POA: Diagnosis not present

## 2021-09-29 DIAGNOSIS — M6281 Muscle weakness (generalized): Secondary | ICD-10-CM | POA: Diagnosis not present

## 2021-09-29 DIAGNOSIS — R202 Paresthesia of skin: Secondary | ICD-10-CM | POA: Diagnosis not present

## 2021-10-01 ENCOUNTER — Telehealth: Payer: Self-pay

## 2021-10-01 ENCOUNTER — Telehealth: Payer: Self-pay | Admitting: Interventional Cardiology

## 2021-10-01 NOTE — Telephone Encounter (Signed)
Pt c/o of Chest Pain: STAT if CP now or developed within 24 hours  1. Are you having CP right now?   Yes  2. Are you experiencing any other symptoms (ex. SOB, nausea, vomiting, sweating)?   No  3. How long have you been experiencing CP? Since yesterday  4. Is your CP continuous or coming and going?   Coming and going  5. Have you taken Nitroglycerin?  No ?   Patient called stating something inside her chest is pinching in the middle of her chest. Started off and on since yesterday.  BP yesterday was 123/77.

## 2021-10-01 NOTE — Telephone Encounter (Signed)
Returned call to patient. Patient states she has been having intermittent chest pain for 2 days. Her BP yesterday was 123/77, she did not check it today. She denies nausea, SOB. She was concerned that stopping hydrochlorothiazide last week was causing CP.   Advised patient that this was not likely due to stopping medication as her BP was in normal range. Advised her to check her BP daily and keep a record. Also provided education on the need for in person evaluation for active CP and advised her to go to the ED for evaluation. Patient verbalized understanding of instructions.

## 2021-10-06 ENCOUNTER — Ambulatory Visit: Payer: BC Managed Care – PPO | Admitting: Physical Therapy

## 2021-10-11 NOTE — Telephone Encounter (Signed)
I have been unable to reach the pt or leave a vm.I have mailed a letter to pt's home address asking for a call back so we could review results of X-Ray.

## 2021-10-13 ENCOUNTER — Ambulatory Visit: Payer: BC Managed Care – PPO | Admitting: Diagnostic Neuroimaging

## 2021-10-13 DIAGNOSIS — R7303 Prediabetes: Secondary | ICD-10-CM | POA: Diagnosis not present

## 2021-10-18 ENCOUNTER — Telehealth: Payer: Self-pay | Admitting: Neurology

## 2021-10-18 ENCOUNTER — Telehealth: Payer: Self-pay | Admitting: Family Medicine

## 2021-10-18 DIAGNOSIS — R202 Paresthesia of skin: Secondary | ICD-10-CM

## 2021-10-18 NOTE — Telephone Encounter (Signed)
Pt is calling about results and would like a call back from nurse. Pt said she is on call a lot and don't get call sometimes while working. Pt said she will be free all-day today because she is off.

## 2021-10-18 NOTE — Telephone Encounter (Signed)
I spoke to the patient. She was been provided with her x-ray results (separate note in Epic). She would like her PT orders sent to the following practice:

## 2021-10-18 NOTE — Telephone Encounter (Signed)
Caller Name: [facility] Call back phone #: [phone]  ext. [phone]  Reason for Call: Calling to check the status of a form that was faxed and received confirmation.

## 2021-10-19 NOTE — Telephone Encounter (Signed)
Can you place a new PT referral please?

## 2021-10-20 NOTE — Addendum Note (Signed)
Addended by: Desmond Lope on: 10/20/2021 07:58 AM   Modules accepted: Orders

## 2021-10-20 NOTE — Telephone Encounter (Signed)
[facility] [place], [place] [address]  [phone]  [phone])

## 2021-10-20 NOTE — Telephone Encounter (Signed)
I sent the referral to the location provided by the patient.

## 2021-10-25 NOTE — Telephone Encounter (Signed)
Called patient and [facility] to inform that forms were received awaiting to be viewed and signed then faxed. No answer for either LMTCB with any questions.

## 2021-10-27 NOTE — Telephone Encounter (Signed)
Charlie with [facility] (Other) [phone] ext [phone]   Called to f/u on documentation being returned to [facility] regarding glucose medications.

## 2021-10-29 ENCOUNTER — Telehealth: Payer: Self-pay | Admitting: Family Medicine

## 2021-10-29 NOTE — Telephone Encounter (Signed)
Ovid Curd called from [employer] stated that she is looking to see if you received a fax from 8.1.23 about medication . Please call riley [phone] [phone]

## 2021-10-29 NOTE — Telephone Encounter (Signed)
Called left message to inform that forms were received they are currently pending to be signed.

## 2021-11-01 ENCOUNTER — Telehealth: Payer: Self-pay

## 2021-11-01 NOTE — Telephone Encounter (Signed)
Paperwork signed and faxed to [facility] called Ovid Curd to inform that paperwork faxed to them.  No answer LMTCb with any questions.

## 2021-11-02 NOTE — Telephone Encounter (Signed)
I resent the referral since I sent it to the wrong location the first time. Sent to: [facility] [address],  [address]  [phone]  [phone] Joylene Igo)  Verified this info with the patient

## 2021-11-15 NOTE — Telephone Encounter (Signed)
Just an FYI, patient called in irate that she has not heard back from the facility where she requested her PT orders be sent. I confirmed that she wanted the referral sent to:  [facility] [address], [place] [address]  She went to look it up and advised that the photos on google has changed and she is no longer sure if that is where she is wanting to be referred. She is insisting that its [address] [facility]. She was given the number to their office to call and confirm that is where she is wanting to go. She will call back if there are any concerns.

## 2021-11-24 ENCOUNTER — Telehealth: Payer: Self-pay | Admitting: Interventional Cardiology

## 2021-11-24 NOTE — Telephone Encounter (Signed)
Pt c/o BP issue: STAT if pt c/o blurred vision, one-sided weakness or slurred speech  1. What are your last 5 BP readings? 157/101 HR 66  2. Are you having any other symptoms (ex. Dizziness, headache, blurred vision, passed out)? headache  3. What is your BP issue? Patient states she went off her BP medication to see how she would do and her BP has been increasing. She says her BP has been increasing. She says a couple weeks ago she spoke with a provider after hours and was told to monitor her BP and call back if it continued to increase. She says she was told she may need lab work to check her potassium and kidney function.

## 2021-11-24 NOTE — Telephone Encounter (Signed)
Returned call to patient.  Per last OV note on 09/17/2021 with Dr. Tamala Julian: Normal blood pressure off hydrochlorothiazide.  I have recommended that she monitor her blood pressure intermittently and if she runs pressures above 140/90 on a consistent basis, she will need her primary care physician to start antihypertensive therapy.  Discussed this with patient, she verbalized understanding.

## 2021-11-25 ENCOUNTER — Encounter: Payer: Self-pay | Admitting: Family Medicine

## 2021-11-25 ENCOUNTER — Ambulatory Visit: Payer: BC Managed Care – PPO | Admitting: Family Medicine

## 2021-11-25 ENCOUNTER — Telehealth: Payer: Self-pay | Admitting: Family Medicine

## 2021-11-25 VITALS — BP 118/64 | HR 67 | Temp 97.4°F | Ht 67.0 in | Wt 147.4 lb

## 2021-11-25 DIAGNOSIS — I998 Other disorder of circulatory system: Secondary | ICD-10-CM

## 2021-11-25 NOTE — Progress Notes (Signed)
Established Patient Office Visit  Subjective   Patient ID: Jenny Wright, female    DOB: 1965/12/10  Age: 56 y.o. MRN: [id number]  Chief Complaint  Patient presents with   Follow-up    Follow up on BP per patient after stopping BP meds BP seems to be elevated.     HPI for follow-up of blood pressure.  Measuring higher at home.  She is seeing cardiology and they recommended that she discontinue all of her blood pressure medicines and follow-up with me.  Not exercising as much as she would like but plans to increase it when she can.  Planning on making a dental appointment for regular dental care.  Seeing neurology for paresthesias in her legs.  She is starting physical therapy soon.  Lives at home with her son.  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    Review of Systems  Constitutional: Negative.   HENT: Negative.    Eyes:  Negative for blurred vision, discharge and redness.  Respiratory: Negative.    Cardiovascular: Negative.   Gastrointestinal:  Negative for abdominal pain.  Genitourinary: Negative.   Musculoskeletal: Negative.  Negative for myalgias.  Skin:  Negative for rash.  Neurological:  Negative for tingling, loss of consciousness, weakness and headaches.  Endo/Heme/Allergies:  Negative for polydipsia.      Objective:     BP 118/64 (BP Location: Right Arm, Patient Position: Sitting, Cuff Size: Normal)   Pulse 67   Temp (!) 97.4 F (36.3 C) (Temporal)   Ht '5\' 7"'$  (1.702 m)   Wt 147 lb 6.4 oz (66.9 kg)   LMP 06/20/2017   SpO2 98%   BMI 23.09 kg/m  BP Readings from Last 3 Encounters:  11/25/21 118/64  09/17/21 (!) 114/58  09/14/21 118/71   Wt Readings from Last 3 Encounters:  11/25/21 147 lb 6.4 oz (66.9 kg)  09/17/21 153 lb 3.2 oz (69.5 kg)  09/14/21 152 lb 8 oz (69.2 kg)      Physical Exam Constitutional:      General: She is not in acute distress.    Appearance: Normal appearance. She is not ill-appearing, toxic-appearing or diaphoretic.   HENT:     Head: Normocephalic and atraumatic.     Right Ear: External ear normal.     Left Ear: External ear normal.  Eyes:     General: No scleral icterus.       Right eye: No discharge.        Left eye: No discharge.     Extraocular Movements: Extraocular movements intact.     Conjunctiva/sclera: Conjunctivae normal.  Cardiovascular:     Rate and Rhythm: Normal rate and regular rhythm.  Pulmonary:     Effort: Pulmonary effort is normal. No respiratory distress.     Breath sounds: Normal breath sounds.  Skin:    General: Skin is warm and dry.  Neurological:     Mental Status: She is alert and oriented to person, place, and time.  Psychiatric:        Mood and Affect: Mood normal.        Behavior: Behavior normal.      No results found for any visits on 11/25/21.    The ASCVD Risk score (Arnett DK, et al., 2019) failed to calculate for the following reasons:   Cannot find a previous HDL lab   Cannot find a previous total cholesterol lab    Assessment & Plan:   Problem List Items Addressed This Visit  Other   Fluctuating blood pressure - Primary    Return in about 3 months (around 02/24/2022).  Explained that blood pressure varies throughout the day.  Advised her to check her pressures no more than once or twice weekly.  She will bring her cuff in with her next visit.  Information was given on preventing hypertension.  She declined a flu vaccine today.  Advised her to have the new COVID-vaccine.  Libby Maw, MD

## 2021-11-25 NOTE — Telephone Encounter (Signed)
At check out pt stated can you give her a call about a cpe appointment and follow up appt.

## 2021-11-30 DIAGNOSIS — M79671 Pain in right foot: Secondary | ICD-10-CM | POA: Diagnosis not present

## 2021-11-30 DIAGNOSIS — B351 Tinea unguium: Secondary | ICD-10-CM | POA: Diagnosis not present

## 2021-11-30 DIAGNOSIS — M79672 Pain in left foot: Secondary | ICD-10-CM | POA: Diagnosis not present

## 2021-12-02 NOTE — Telephone Encounter (Signed)
Patient left a voice mail that she had her PT appointment yesterday, but it will be too expensive for her so she is asking that we send her referral to another facility. She said it is an [facility] facility at [address] phone number [phone] fax [phone]. Please call her and verify this info with her so it gets sent to the correct location she wants. Let her know you're doing referrals now please. Thanks

## 2021-12-03 NOTE — Telephone Encounter (Signed)
Spoke with pt to confirm fax number. Pt requested sent to [facility]. Phone:  [phone], Fax: [phone]

## 2021-12-06 ENCOUNTER — Other Ambulatory Visit: Payer: Self-pay | Admitting: Family Medicine

## 2021-12-06 DIAGNOSIS — R7303 Prediabetes: Secondary | ICD-10-CM

## 2021-12-07 ENCOUNTER — Ambulatory Visit: Payer: BC Managed Care – PPO | Admitting: Family Medicine

## 2021-12-16 DIAGNOSIS — L089 Local infection of the skin and subcutaneous tissue, unspecified: Secondary | ICD-10-CM | POA: Diagnosis not present

## 2021-12-16 DIAGNOSIS — L299 Pruritus, unspecified: Secondary | ICD-10-CM | POA: Diagnosis not present

## 2022-01-12 DIAGNOSIS — R293 Abnormal posture: Secondary | ICD-10-CM | POA: Diagnosis not present

## 2022-01-12 DIAGNOSIS — Z981 Arthrodesis status: Secondary | ICD-10-CM | POA: Diagnosis not present

## 2022-01-12 DIAGNOSIS — R202 Paresthesia of skin: Secondary | ICD-10-CM | POA: Diagnosis not present

## 2022-01-19 DIAGNOSIS — B9689 Other specified bacterial agents as the cause of diseases classified elsewhere: Secondary | ICD-10-CM | POA: Diagnosis not present

## 2022-01-19 DIAGNOSIS — N898 Other specified noninflammatory disorders of vagina: Secondary | ICD-10-CM | POA: Diagnosis not present

## 2022-01-19 DIAGNOSIS — N9089 Other specified noninflammatory disorders of vulva and perineum: Secondary | ICD-10-CM | POA: Diagnosis not present

## 2022-01-19 DIAGNOSIS — N760 Acute vaginitis: Secondary | ICD-10-CM | POA: Diagnosis not present

## 2022-01-21 DIAGNOSIS — R293 Abnormal posture: Secondary | ICD-10-CM | POA: Diagnosis not present

## 2022-01-21 DIAGNOSIS — Z981 Arthrodesis status: Secondary | ICD-10-CM | POA: Diagnosis not present

## 2022-01-21 DIAGNOSIS — R29898 Other symptoms and signs involving the musculoskeletal system: Secondary | ICD-10-CM | POA: Diagnosis not present

## 2022-02-03 DIAGNOSIS — Z1151 Encounter for screening for human papillomavirus (HPV): Secondary | ICD-10-CM | POA: Diagnosis not present

## 2022-02-03 DIAGNOSIS — Z124 Encounter for screening for malignant neoplasm of cervix: Secondary | ICD-10-CM | POA: Diagnosis not present

## 2022-02-03 DIAGNOSIS — N950 Postmenopausal bleeding: Secondary | ICD-10-CM | POA: Diagnosis not present

## 2022-02-03 DIAGNOSIS — D259 Leiomyoma of uterus, unspecified: Secondary | ICD-10-CM | POA: Diagnosis not present

## 2022-02-03 DIAGNOSIS — N952 Postmenopausal atrophic vaginitis: Secondary | ICD-10-CM | POA: Diagnosis not present

## 2022-02-04 DIAGNOSIS — Z981 Arthrodesis status: Secondary | ICD-10-CM | POA: Diagnosis not present

## 2022-02-04 DIAGNOSIS — R293 Abnormal posture: Secondary | ICD-10-CM | POA: Diagnosis not present

## 2022-02-04 DIAGNOSIS — R29898 Other symptoms and signs involving the musculoskeletal system: Secondary | ICD-10-CM | POA: Diagnosis not present

## 2022-02-08 DIAGNOSIS — Z981 Arthrodesis status: Secondary | ICD-10-CM | POA: Diagnosis not present

## 2022-02-08 DIAGNOSIS — R293 Abnormal posture: Secondary | ICD-10-CM | POA: Diagnosis not present

## 2022-02-08 DIAGNOSIS — R29898 Other symptoms and signs involving the musculoskeletal system: Secondary | ICD-10-CM | POA: Diagnosis not present

## 2022-02-14 DIAGNOSIS — K219 Gastro-esophageal reflux disease without esophagitis: Secondary | ICD-10-CM | POA: Diagnosis not present

## 2022-02-22 ENCOUNTER — Other Ambulatory Visit: Payer: Self-pay | Admitting: Family Medicine

## 2022-02-22 ENCOUNTER — Encounter: Payer: Self-pay | Admitting: Family Medicine

## 2022-02-22 ENCOUNTER — Telehealth: Payer: Self-pay

## 2022-02-22 ENCOUNTER — Ambulatory Visit (INDEPENDENT_AMBULATORY_CARE_PROVIDER_SITE_OTHER): Payer: BC Managed Care – PPO | Admitting: Family Medicine

## 2022-02-22 VITALS — BP 136/78 | HR 71 | Temp 97.1°F | Ht 67.0 in | Wt 150.2 lb

## 2022-02-22 DIAGNOSIS — Z1231 Encounter for screening mammogram for malignant neoplasm of breast: Secondary | ICD-10-CM

## 2022-02-22 DIAGNOSIS — R7303 Prediabetes: Secondary | ICD-10-CM | POA: Diagnosis not present

## 2022-02-22 DIAGNOSIS — N950 Postmenopausal bleeding: Secondary | ICD-10-CM | POA: Diagnosis not present

## 2022-02-22 DIAGNOSIS — N898 Other specified noninflammatory disorders of vagina: Secondary | ICD-10-CM | POA: Diagnosis not present

## 2022-02-22 DIAGNOSIS — Z0000 Encounter for general adult medical examination without abnormal findings: Secondary | ICD-10-CM

## 2022-02-22 LAB — COMPREHENSIVE METABOLIC PANEL
ALT: 14 U/L (ref 0–35)
AST: 17 U/L (ref 0–37)
Albumin: 4.7 g/dL (ref 3.5–5.2)
Alkaline Phosphatase: 69 U/L (ref 39–117)
BUN: 13 mg/dL (ref 6–23)
CO2: 32 mEq/L (ref 19–32)
Calcium: 9.7 mg/dL (ref 8.4–10.5)
Chloride: 102 mEq/L (ref 96–112)
Creatinine, Ser: 0.79 mg/dL (ref 0.40–1.20)
GFR: 83.63 mL/min (ref >60.00)
Glucose, Bld: 107 mg/dL — ABNORMAL HIGH (ref 70–99)
Potassium: 4.1 mEq/L (ref 3.5–5.1)
Sodium: 140 mEq/L (ref 135–145)
Total Bilirubin: 0.8 mg/dL (ref 0.2–1.2)
Total Protein: 7.4 g/dL (ref 6.0–8.3)

## 2022-02-22 LAB — CBC WITH DIFFERENTIAL/PLATELET
Basophils Absolute: 0 10*3/uL (ref 0.0–0.1)
Basophils Relative: 0.8 % (ref 0.0–3.0)
Eosinophils Absolute: 0 10*3/uL (ref 0.0–0.7)
Eosinophils Relative: 0.9 % (ref 0.0–5.0)
HCT: 40.3 % (ref 36.0–46.0)
Hemoglobin: 13.6 g/dL (ref 12.0–15.0)
Lymphocytes Relative: 43.3 % (ref 12.0–46.0)
Lymphs Abs: 1.5 10*3/uL (ref 0.7–4.0)
MCHC: 33.8 g/dL (ref 30.0–36.0)
MCV: 88.4 fl (ref 78.0–100.0)
Monocytes Absolute: 0.3 10*3/uL (ref 0.1–1.0)
Monocytes Relative: 8.4 % (ref 3.0–12.0)
Neutro Abs: 1.6 10*3/uL (ref 1.4–7.7)
Neutrophils Relative %: 46.6 % (ref 43.0–77.0)
Platelets: 215 10*3/uL (ref 150.0–400.0)
RBC: 4.56 Mil/uL (ref 3.87–5.11)
RDW: 13.2 % (ref 11.5–15.5)
WBC: 3.5 10*3/uL — ABNORMAL LOW (ref 4.0–10.5)

## 2022-02-22 LAB — URINALYSIS, ROUTINE W REFLEX MICROSCOPIC
Bilirubin Urine: NEGATIVE
Hgb urine dipstick: NEGATIVE
Ketones, ur: NEGATIVE
Leukocytes,Ua: NEGATIVE
Nitrite: NEGATIVE
Specific Gravity, Urine: 1.02 (ref 1.000–1.030)
Total Protein, Urine: NEGATIVE
Urine Glucose: NEGATIVE
Urobilinogen, UA: 0.2 (ref 0.0–1.0)
pH: 6 (ref 5.0–8.0)

## 2022-02-22 LAB — LIPID PANEL
Cholesterol: 253 mg/dL — ABNORMAL HIGH (ref 0–200)
HDL: 67.8 mg/dL (ref >39.00)
LDL Cholesterol: 170 mg/dL — ABNORMAL HIGH (ref 0–99)
NonHDL: 184.78
Total CHOL/HDL Ratio: 4
Triglycerides: 75 mg/dL (ref 0.0–149.0)
VLDL: 15 mg/dL (ref 0.0–40.0)

## 2022-02-22 LAB — HEMOGLOBIN A1C: Hgb A1c MFr Bld: 6 % (ref 4.6–6.5)

## 2022-02-22 NOTE — Progress Notes (Signed)
Established Patient Office Visit   Subjective:  Patient ID: Jenny Wright, female    DOB: 07/25/1965  Age: 56 y.o. MRN: [id number]  Chief Complaint  Patient presents with   Annual Exam    CPE, referral for 3D mammogram. Patient fasting.     HPI Encounter Diagnoses  Name Primary?   Prediabetes Yes   Healthcare maintenance    For yearly physical.  She is fasting.  She exercises by walking or riding her stationary bike.  She has access to dental care and goes for regular cleanings.  She has regular follow-up with her GYN provider.  She lives at home with her 56 year old son.   Review of Systems  Constitutional: Negative.   HENT: Negative.    Eyes:  Negative for blurred vision, discharge and redness.  Respiratory: Negative.    Cardiovascular: Negative.   Gastrointestinal:  Negative for abdominal pain.  Genitourinary: Negative.   Musculoskeletal: Negative.  Negative for myalgias.  Skin:  Negative for rash.  Neurological:  Negative for tingling, loss of consciousness and weakness.  Endo/Heme/Allergies:  Negative for polydipsia.     Current Outpatient Medications:    ACCU-CHEK GUIDE test strip, USE AS DIRECTED EVERY OTHER DAY TO CHECK FASTING MORNING BLOOD SUGARS, Disp: 50 strip, Rfl: 0   b complex vitamins capsule, Take 1 capsule by mouth daily., Disp: , Rfl:    blood glucose meter kit and supplies KIT, Dispense based on patient and insurance preference.  Please check fasting morning blood sugars every other day., Disp: 1 each, Rfl: 0   cetirizine (ZYRTEC) 10 MG tablet, Take 1 tablet (10 mg total) by mouth daily., Disp: 30 tablet, Rfl: 11   Cholecalciferol (VITAMIN D) 50 MCG (2000 UT) tablet, Take 2,000 Units by mouth daily., Disp: , Rfl:    [employer]S VITAMIN C 1000 MG tablet, Take 2,000 mg by mouth daily., Disp: , Rfl:    diclofenac Sodium (VOLTAREN) 1 % GEL, 2 gram qid prn, Disp: 100 g, Rfl: 3   Ferrous Sulfate (IRON PO), Take by mouth daily., Disp: , Rfl:    fluticasone  (FLONASE) 50 MCG/ACT nasal spray, Place 2 sprays into both nostrils daily., Disp: 16 g, Rfl: 6   Multiple Vitamin (MULTI-VITAMIN) tablet, Take 1 tablet by mouth daily., Disp: , Rfl:    zinc gluconate 50 MG tablet, Take 50 mg by mouth daily., Disp: , Rfl:    Objective:     BP 136/78 (BP Location: Right Arm, Patient Position: Sitting, Cuff Size: Normal)   Pulse 71   Temp (!) 97.1 F (36.2 C) (Temporal)   Ht _0  (1.702 m)   Wt 150 lb 3.2 oz (68.1 kg)   LMP 06/20/2017   SpO2 97%   BMI 23.52 kg/m    Physical Exam Constitutional:      General: She is not in acute distress.    Appearance: Normal appearance. She is not ill-appearing, toxic-appearing or diaphoretic.  HENT:     Head: Normocephalic and atraumatic.     Right Ear: Tympanic membrane, ear canal and external ear normal.     Left Ear: Tympanic membrane, ear canal and external ear normal.     Mouth/Throat:     Mouth: Mucous membranes are moist.     Pharynx: Oropharynx is clear. No oropharyngeal exudate or posterior oropharyngeal erythema.  Eyes:     General: No scleral icterus.       Right eye: No discharge.        Left eye: No  discharge.     Extraocular Movements: Extraocular movements intact.     Conjunctiva/sclera: Conjunctivae normal.     Pupils: Pupils are equal, round, and reactive to light.  Cardiovascular:     Rate and Rhythm: Normal rate and regular rhythm.  Pulmonary:     Effort: Pulmonary effort is normal. No respiratory distress.     Breath sounds: Normal breath sounds.  Abdominal:     General: Bowel sounds are normal.  Musculoskeletal:     Cervical back: No rigidity or tenderness.  Skin:    General: Skin is warm and dry.  Neurological:     Mental Status: She is alert and oriented to person, place, and time.  Psychiatric:        Mood and Affect: Mood normal.        Behavior: Behavior normal.      No results found for any visits on 02/22/22.    The ASCVD Risk score (Arnett DK, et al., 2019)  failed to calculate for the following reasons:   Cannot find a previous HDL lab   Cannot find a previous total cholesterol lab    Assessment & Plan:   Prediabetes -     Comprehensive metabolic panel -     Hemoglobin A1c  Healthcare maintenance -     CBC with Differential/Platelet -     Lipid panel -     Urinalysis, Routine w reflex microscopic    Return in about 1 year (around 02/23/2023), or if symptoms worsen or fail to improve.  Continue active healthy lifestyle.  Follow-up with the breast center for 3D mammogram.  Libby Maw, MD

## 2022-02-24 ENCOUNTER — Ambulatory Visit: Payer: BC Managed Care – PPO | Admitting: Family Medicine

## 2022-03-01 ENCOUNTER — Telehealth: Payer: Self-pay | Admitting: Family Medicine

## 2022-03-01 NOTE — Telephone Encounter (Signed)
Pt is wanting a cb concerning her most recent lab results. Please advise at [phone]

## 2022-03-04 ENCOUNTER — Telehealth: Payer: Self-pay | Admitting: Interventional Cardiology

## 2022-03-04 NOTE — Telephone Encounter (Signed)
Returned call to pt.  She has been made aware that Dr. Tamala Julian nor his RN, Eulogio Ditch, is in the office today and that I would route the call to Dr. Tamala Julian so he can view her chart / history and make a recommendation.  Pt verbalized understanding and thanked me for following back up with her.

## 2022-03-04 NOTE — Telephone Encounter (Signed)
Patient is requesting to speak with Dr. Thompson Caul nurse regarding her LDL and how it relates to her heart. She would like to know when it will become necessary to take a statin and which statin is best for patient's who are prone to medication reactions. She would also like to know how often she should have her arteries rechecked. Please advise.

## 2022-03-04 NOTE — Telephone Encounter (Signed)
Returned patients call to go over labs, no answer unable to LM will call back. Duplicate message

## 2022-03-08 DIAGNOSIS — M79671 Pain in right foot: Secondary | ICD-10-CM | POA: Diagnosis not present

## 2022-03-08 DIAGNOSIS — B351 Tinea unguium: Secondary | ICD-10-CM | POA: Diagnosis not present

## 2022-03-08 DIAGNOSIS — M79672 Pain in left foot: Secondary | ICD-10-CM | POA: Diagnosis not present

## 2022-03-09 ENCOUNTER — Telehealth: Payer: Self-pay | Admitting: Interventional Cardiology

## 2022-03-09 NOTE — Telephone Encounter (Signed)
Patient called back due to not getting a call for  her previous message.She has concerns about recent labs and her LDL and wants to discuss with Dr. Tamala Julian. I advised I would let Dr Tamala Julian and nurse know of her concerns and someone would follow up with her tomorrow.

## 2022-03-09 NOTE — Telephone Encounter (Signed)
Patient is returning call back to talk with Dr. Tamala Julian or nurse. Says that nobody has gotten back with her

## 2022-03-10 NOTE — Telephone Encounter (Signed)
See phone note from 03/09/2022.

## 2022-03-10 NOTE — Telephone Encounter (Signed)
Called pt in regards to referral to Dr. Debara Pickett.   Pt reports would like Dr. Tamala Julian opinion on LDL- 170.  Wants to know why LDL level was not caught by now.  Wants to know if BP could contribute to higher LDL.  Pt has a lot of questions and would like to speak with Dr. Tamala Julian or Dr. Debara Pickett to get more information.   Advised pt of Primary nurse's conversation with pt.  MD will reply after 03/25/21.  Also, advised to work on diet and exercise told pt to look up the [place].  Pt talked for 15 I had to advise pt that MD will respond when able and I will have to end call.

## 2022-03-10 NOTE — Telephone Encounter (Signed)
Returned call to patient, had a 30 minute discussion with her about her recent lab results. Patient states her LDL cholesterol is elevated and her PCP would like to start her on cholesterol-lowering medications.  Patient has many concerns regarding her results and medications. She insists on having Dr. Tamala Julian review her history, lab results and calcium score from 2022 and give her his opinion on how to best treat her cholesterol. She does not just want to start medications and is concerned about medication sensitivities.  Patient states she read about discontinuing BP medication can cause increased LDL and decreased HDL. She is concerned that the tingling she has complained of could be from elevated LDL levels.  Patient would like to speak with Dr. Tamala Julian if possible about her concerns and what he recommends for her.   Advised patient I may not have an answer for her until 03/25/22 when Dr. Tamala Julian is back in the office, but will forward message to him to see if he can respond to her concerns before then.  Patient verbalized understanding and expressed appreciation for assistance.

## 2022-03-10 NOTE — Telephone Encounter (Signed)
Patient is following up, requesting to speak with Dr. Thompson Caul nurse again. She states she is interested in a possible referral to [facility]. [facility] and she would like to know what the clinic entails.

## 2022-03-11 ENCOUNTER — Telehealth: Payer: Self-pay | Admitting: Family Medicine

## 2022-03-11 DIAGNOSIS — R0789 Other chest pain: Secondary | ICD-10-CM | POA: Diagnosis not present

## 2022-03-11 DIAGNOSIS — I10 Essential (primary) hypertension: Secondary | ICD-10-CM | POA: Diagnosis not present

## 2022-03-11 NOTE — Telephone Encounter (Signed)
Spoke with Mollie at Dr. Lysbeth [facility]ner office. They have no appointments available until June 2024. Patient agrees to waitlist.

## 2022-03-11 NOTE — Telephone Encounter (Signed)
Pt called and stated that she have questions about medication you put on , pt also need to know the name of medication.please call the pt

## 2022-03-11 NOTE — Telephone Encounter (Signed)
Patient is calling back bout the referral being sent to Dr. Debara Pickett. Please advise

## 2022-03-11 NOTE — Telephone Encounter (Signed)
Spoke with patient, patient agrees to be set up with Dr. Debara Pickett. She agrees to discuss any statins with Dr. Debara Pickett. Also sent staff message to scheduling to try and get her appointment with Dr. Debara Pickett set up today.

## 2022-03-15 ENCOUNTER — Telehealth: Payer: Self-pay | Admitting: Family Medicine

## 2022-03-15 ENCOUNTER — Emergency Department (HOSPITAL_BASED_OUTPATIENT_CLINIC_OR_DEPARTMENT_OTHER): Payer: BC Managed Care – PPO

## 2022-03-15 ENCOUNTER — Other Ambulatory Visit: Payer: Self-pay

## 2022-03-15 ENCOUNTER — Encounter (HOSPITAL_BASED_OUTPATIENT_CLINIC_OR_DEPARTMENT_OTHER): Payer: Self-pay | Admitting: Emergency Medicine

## 2022-03-15 ENCOUNTER — Emergency Department (HOSPITAL_BASED_OUTPATIENT_CLINIC_OR_DEPARTMENT_OTHER)
Admission: EM | Admit: 2022-03-15 | Discharge: 2022-03-15 | Disposition: A | Discharge status: Home / Self Care | Payer: BC Managed Care – PPO | Attending: Emergency Medicine | Admitting: Emergency Medicine

## 2022-03-15 DIAGNOSIS — R209 Unspecified disturbances of skin sensation: Secondary | ICD-10-CM | POA: Diagnosis not present

## 2022-03-15 DIAGNOSIS — R519 Headache, unspecified: Secondary | ICD-10-CM | POA: Diagnosis not present

## 2022-03-15 DIAGNOSIS — R531 Weakness: Secondary | ICD-10-CM | POA: Diagnosis not present

## 2022-03-15 DIAGNOSIS — R202 Paresthesia of skin: Secondary | ICD-10-CM | POA: Insufficient documentation

## 2022-03-15 DIAGNOSIS — Z1152 Encounter for screening for COVID-19: Secondary | ICD-10-CM | POA: Diagnosis not present

## 2022-03-15 DIAGNOSIS — R9431 Abnormal electrocardiogram [ECG] [EKG]: Secondary | ICD-10-CM | POA: Diagnosis not present

## 2022-03-15 LAB — CBC
HCT: 36.6 % (ref 36.0–46.0)
Hemoglobin: 12.2 g/dL (ref 12.0–15.0)
MCH: 29.2 pg (ref 26.0–34.0)
MCHC: 33.3 g/dL (ref 30.0–36.0)
MCV: 87.6 fL (ref 80.0–100.0)
Platelets: 164 10*3/uL (ref 150–400)
RBC: 4.18 MIL/uL (ref 3.87–5.11)
RDW: 12.3 % (ref 11.5–15.5)
WBC: 2.8 10*3/uL — ABNORMAL LOW (ref 4.0–10.5)
nRBC: 0 % (ref 0.0–0.2)

## 2022-03-15 LAB — URINALYSIS, ROUTINE W REFLEX MICROSCOPIC
Bilirubin Urine: NEGATIVE
Glucose, UA: NEGATIVE mg/dL
Hgb urine dipstick: NEGATIVE
Ketones, ur: NEGATIVE mg/dL
Nitrite: NEGATIVE
Protein, ur: NEGATIVE mg/dL
Specific Gravity, Urine: >=1.03 (ref 1.005–1.030)
pH: 5.5 (ref 5.0–8.0)

## 2022-03-15 LAB — RESP PANEL BY RT-PCR (RSV, FLU A&B, COVID)  RVPGX2
Influenza A by PCR: NEGATIVE
Influenza B by PCR: NEGATIVE
Resp Syncytial Virus by PCR: NEGATIVE
SARS Coronavirus 2 by RT PCR: NEGATIVE

## 2022-03-15 LAB — BASIC METABOLIC PANEL
Anion gap: 8 (ref 5–15)
BUN: 12 mg/dL (ref 6–20)
CO2: 26 mmol/L (ref 22–32)
Calcium: 9.4 mg/dL (ref 8.9–10.3)
Chloride: 105 mmol/L (ref 98–111)
Creatinine, Ser: 0.72 mg/dL (ref 0.44–1.00)
GFR, Estimated: >60 mL/min (ref >60)
Glucose, Bld: 101 mg/dL — ABNORMAL HIGH (ref 70–99)
Potassium: 3.9 mmol/L (ref 3.5–5.1)
Sodium: 139 mmol/L (ref 135–145)

## 2022-03-15 LAB — URINALYSIS, MICROSCOPIC (REFLEX): RBC / HPF: NONE SEEN RBC/hpf (ref 0–5)

## 2022-03-15 MED ORDER — SODIUM CHLORIDE 0.9 % IV BOLUS
1000.0000 mL | Freq: Once | INTRAVENOUS | Status: AC
  Administered 2022-03-15: 1000 mL via INTRAVENOUS

## 2022-03-15 NOTE — ED Provider Notes (Signed)
[facility] EMERGENCY DEPARTMENT Provider Note   CSN: [id number] Arrival date & time: 03/15/22  1445     History  Chief Complaint  Patient presents with   Weakness    Jenny Wright is a 56 y.o. female.  HPI 56 year old female presents with weakness and abnormal head feeling. Started this morning when she woke up. When she first woke up, her entire left arm felt numb and was weak. Overall lasted ~10 minutes. She also felt like both legs have been heavy since she woke up. No numbness. Still present now. She's also feeling like her head is full and cloudy. It's not a pain but more of a pressure, like a balloon inflation. No facial or speech symptoms. No visual complaints.   Home Medications Prior to Admission medications   Medication Sig Start Date End Date Taking? Authorizing Provider  ACCU-CHEK GUIDE test strip USE AS DIRECTED EVERY OTHER DAY TO CHECK FASTING MORNING BLOOD SUGARS 12/06/21   Libby Maw, MD  b complex vitamins capsule Take 1 capsule by mouth daily.    [provider]  blood glucose meter kit and supplies KIT Dispense based on patient and insurance preference.  Please check fasting morning blood sugars every other day. 09/06/21   Libby Maw, MD  cetirizine (ZYRTEC) 10 MG tablet Take 1 tablet (10 mg total) by mouth daily. 06/24/21   Libby Maw, MD  Cholecalciferol (VITAMIN D) 50 MCG (2000 UT) tablet Take 2,000 Units by mouth daily. 07/27/20   [provider]  CVS VITAMIN C 1000 MG tablet Take 2,000 mg by mouth daily. 07/27/20   [provider]  diclofenac Sodium (VOLTAREN) 1 % GEL 2 gram qid prn 09/14/21   Marcial Pacas, MD  Ferrous Sulfate (IRON PO) Take by mouth daily.    [provider]  fluticasone (FLONASE) 50 MCG/ACT nasal spray Place 2 sprays into both nostrils daily. 06/24/21   Libby Maw, MD  Multiple Vitamin (MULTI-VITAMIN) tablet Take 1 tablet by mouth daily.    [provider]  zinc gluconate 50 MG tablet Take 50 mg by mouth daily. 07/27/20   [provider]      Allergies    Penicillins, Hydrochlorothiazide, Methylprednisolone, and Omeprazole    Review of Systems   Review of Systems  Eyes:  Negative for visual disturbance.  Musculoskeletal:  Negative for neck pain and neck stiffness.  Neurological:  Positive for weakness and numbness. Negative for headaches.    Physical Exam Updated Vital Signs BP 130/73   Pulse (!) 52   Temp 98.5 F (36.9 C) (Oral)   Resp 16   LMP 06/20/2017   SpO2 100%  Physical Exam Vitals and nursing note reviewed.  Constitutional:      Appearance: She is well-developed.  HENT:     Head: Normocephalic and atraumatic.  Eyes:     Extraocular Movements: Extraocular movements intact.     Pupils: Pupils are equal, round, and reactive to light.  Cardiovascular:     Rate and Rhythm: Normal rate and regular rhythm.     Pulses:          Radial pulses are 2+ on the left side.     Heart sounds: Normal heart sounds.  Pulmonary:     Effort: Pulmonary effort is normal.     Breath sounds: Normal breath sounds.  Abdominal:     Palpations: Abdomen is soft.     Tenderness: There is no abdominal tenderness.  Skin:    General: Skin is warm and dry.  Neurological:     Mental Status: She is alert.     Comments: CN 3-12 grossly intact. 5/5 strength in all 4 extremities. Grossly normal sensation. Normal finger to nose.      ED Results / Procedures / Treatments   Labs (all labs ordered are listed, but only abnormal results are displayed) Labs Reviewed  BASIC METABOLIC PANEL - Abnormal; Notable for the following components:      Result Value   Glucose, Bld 101 (*)    All other components within normal limits  CBC - Abnormal; Notable for the following components:   WBC 2.8 (*)    All other components within normal limits  URINALYSIS, ROUTINE W REFLEX MICROSCOPIC - Abnormal; Notable for the following components:    Leukocytes,Ua TRACE (*)    All other components within normal limits  URINALYSIS, MICROSCOPIC (REFLEX) - Abnormal; Notable for the following components:   Bacteria, UA RARE (*)    All other components within normal limits  RESP PANEL BY RT-PCR (RSV, FLU A&B, COVID)  RVPGX2    EKG EKG Interpretation  Date/Time:  Tuesday March 15 2022 14:58:02 EST Ventricular Rate:  61 PR Interval:  147 QRS Duration: 79 QT Interval:  389 QTC Calculation: 392 R Axis:   84 Text Interpretation: Sinus rhythm Atrial premature complex Borderline repolarization abnormality nonspecific changes unchanged since Nov 2022 Confirmed by Sherwood Gambler [phone]) on 03/15/2022 6:09:27 PM  Radiology CT Head Wo Contrast  Result Date: 03/15/2022 CLINICAL DATA:  Generalized upper and lower extremity weakness, headache EXAM: CT HEAD WITHOUT CONTRAST TECHNIQUE: Contiguous axial images were obtained from the base of the skull through the vertex without intravenous contrast. RADIATION DOSE REDUCTION: This exam was performed according to the departmental dose-optimization program which includes automated exposure control, adjustment of the mA and/or kV according to patient size and/or use of iterative reconstruction technique. COMPARISON:  02/09/2012 FINDINGS: Brain: No acute infarct or hemorrhage. Lateral ventricles and midline structures are unremarkable. No acute extra-axial fluid collections. No mass effect. Vascular: No hyperdense vessel or unexpected calcification. Skull: Normal. Negative for fracture or focal lesion. Sinuses/Orbits: No acute finding. Other: None. IMPRESSION: 1. No acute intracranial process. Electronically Signed   By: Randa Ngo M.D.   On: 03/15/2022 18:51    Procedures Procedures    Medications Ordered in ED Medications  sodium chloride 0.9 % bolus 1,000 mL ( Intravenous Stopped 03/15/22 1957)    ED Course/ Medical Decision Making/ A&P                           Medical Decision  Making Amount and/or Complexity of Data Reviewed Labs: ordered.    Details: COVID/flu testing negative.  No significant electrolyte disturbance.  Leukopenia but this is similar to previous value. Radiology: ordered and independent interpretation performed.    Details: No head bleed. ECG/medicine tests: independent interpretation performed.    Details: No acute ischemia.   Patient presents with numbness and weakness.  Based on presentation TIA/stroke is in the differential though I think this is less likely.  More likely paresthesias.  Especially given the bilateral leg weakness symptoms.  Neuroexam is unremarkable here.  Labs, CT, etc. are unremarkable.  I did discuss briefly with neurology, Dr. Cheral Marker.  Patient is unable to get an MRI given her history of rods in her back sciatica treatment which she states is not MRI compatible.  Either way,  this sounds unlikely enough to be TIA/stroke that she can be referred to outpatient neuro based on that conversation.  We did discuss return precautions as well as need for neuro follow-up.  Otherwise she appears stable for discharge.        Final Clinical Impression(s) / ED Diagnoses Final diagnoses:  Paresthesia    Rx / DC Orders ED Discharge Orders     None         Sherwood Gambler, MD 03/15/22 [phone]

## 2022-03-15 NOTE — Telephone Encounter (Signed)
Spoke with patient who states that she spoke with triage nurse and was advised to go to ED patient agrees and will go today.

## 2022-03-15 NOTE — Telephone Encounter (Signed)
Returned patients call no answer VM full unable to LM. Will call back.

## 2022-03-15 NOTE — Telephone Encounter (Signed)
Pt called in needing to speak with a nurse. She is saying she can not be put on any more prescriptions because of a script she has been put on by her OBGYN. She is feeling foggy and she goes numb all over. She then stated "I'm afraid for my child". I transferred her over to nurse triage.

## 2022-03-15 NOTE — Telephone Encounter (Signed)
Duplicate message, patient advised to be seen at ED today per patient she will go

## 2022-03-15 NOTE — Discharge Instructions (Signed)
Follow-up with the neurologist for further workup and examination regarding the numbness she had.  If you develop severe headache, new or worsening numbness, trouble with vision or speech, weakness in your extremities, or any other new/concerning symptoms then return to the ER for evaluation.

## 2022-03-15 NOTE — ED Triage Notes (Signed)
Pt here from home with c/o  gen  weakness both arms and legs and headache overnight ,

## 2022-03-18 NOTE — Telephone Encounter (Signed)
Patient states: - Last night she took an aspirin; noticed when waking up she had a bruise  on her arm   Wasn't able to sleep last night due to blood rush in head type of feeling  - Following her previous triage and ED visit, triage nurse informed her to follow up with PCP  Pt has been scheduled with PCP on 03/25/22 @ 3pm. Patient requests to speak with triage nurse due to concern of bruise on arm.   Pt has been transferred to triage.

## 2022-03-19 ENCOUNTER — Other Ambulatory Visit: Payer: Self-pay

## 2022-03-19 ENCOUNTER — Encounter (HOSPITAL_COMMUNITY): Payer: Self-pay | Admitting: Emergency Medicine

## 2022-03-19 ENCOUNTER — Emergency Department (HOSPITAL_COMMUNITY): Payer: BC Managed Care – PPO

## 2022-03-19 ENCOUNTER — Emergency Department (HOSPITAL_COMMUNITY)
Admission: EM | Admit: 2022-03-19 | Discharge: 2022-03-19 | Disposition: A | Discharge status: Home / Self Care | Payer: BC Managed Care – PPO | Attending: Emergency Medicine | Admitting: Emergency Medicine

## 2022-03-19 DIAGNOSIS — Z20822 Contact with and (suspected) exposure to covid-19: Secondary | ICD-10-CM | POA: Diagnosis not present

## 2022-03-19 DIAGNOSIS — R519 Headache, unspecified: Secondary | ICD-10-CM

## 2022-03-19 DIAGNOSIS — I671 Cerebral aneurysm, nonruptured: Secondary | ICD-10-CM

## 2022-03-19 DIAGNOSIS — I720 Aneurysm of carotid artery: Secondary | ICD-10-CM | POA: Diagnosis not present

## 2022-03-19 DIAGNOSIS — I6501 Occlusion and stenosis of right vertebral artery: Secondary | ICD-10-CM | POA: Diagnosis not present

## 2022-03-19 DIAGNOSIS — Z8616 Personal history of COVID-19: Secondary | ICD-10-CM | POA: Insufficient documentation

## 2022-03-19 DIAGNOSIS — I10 Essential (primary) hypertension: Secondary | ICD-10-CM | POA: Diagnosis not present

## 2022-03-19 DIAGNOSIS — I6521 Occlusion and stenosis of right carotid artery: Secondary | ICD-10-CM | POA: Diagnosis not present

## 2022-03-19 LAB — CBC WITH DIFFERENTIAL/PLATELET
Abs Immature Granulocytes: 0 10*3/uL (ref 0.00–0.07)
Basophils Absolute: 0 10*3/uL (ref 0.0–0.1)
Basophils Relative: 0 %
Eosinophils Absolute: 0 10*3/uL (ref 0.0–0.5)
Eosinophils Relative: 1 %
HCT: 35.8 % — ABNORMAL LOW (ref 36.0–46.0)
Hemoglobin: 12.1 g/dL (ref 12.0–15.0)
Immature Granulocytes: 0 %
Lymphocytes Relative: 36 %
Lymphs Abs: 1.1 10*3/uL (ref 0.7–4.0)
MCH: 29.8 pg (ref 26.0–34.0)
MCHC: 33.8 g/dL (ref 30.0–36.0)
MCV: 88.2 fL (ref 80.0–100.0)
Monocytes Absolute: 0.3 10*3/uL (ref 0.1–1.0)
Monocytes Relative: 8 %
Neutro Abs: 1.6 10*3/uL — ABNORMAL LOW (ref 1.7–7.7)
Neutrophils Relative %: 55 %
Platelets: 160 10*3/uL (ref 150–400)
RBC: 4.06 MIL/uL (ref 3.87–5.11)
RDW: 12.1 % (ref 11.5–15.5)
WBC: 3 10*3/uL — ABNORMAL LOW (ref 4.0–10.5)
nRBC: 0 % (ref 0.0–0.2)

## 2022-03-19 LAB — COMPREHENSIVE METABOLIC PANEL
ALT: 15 U/L (ref 0–44)
AST: 20 U/L (ref 15–41)
Albumin: 4 g/dL (ref 3.5–5.0)
Alkaline Phosphatase: 49 U/L (ref 38–126)
Anion gap: 10 (ref 5–15)
BUN: 11 mg/dL (ref 6–20)
CO2: 25 mmol/L (ref 22–32)
Calcium: 9.4 mg/dL (ref 8.9–10.3)
Chloride: 106 mmol/L (ref 98–111)
Creatinine, Ser: 0.86 mg/dL (ref 0.44–1.00)
GFR, Estimated: >60 mL/min (ref >60)
Glucose, Bld: 105 mg/dL — ABNORMAL HIGH (ref 70–99)
Potassium: 4 mmol/L (ref 3.5–5.1)
Sodium: 141 mmol/L (ref 135–145)
Total Bilirubin: 0.4 mg/dL (ref 0.3–1.2)
Total Protein: 6.5 g/dL (ref 6.5–8.1)

## 2022-03-19 LAB — RESP PANEL BY RT-PCR (RSV, FLU A&B, COVID)  RVPGX2
Influenza A by PCR: NEGATIVE
Influenza B by PCR: NEGATIVE
Resp Syncytial Virus by PCR: NEGATIVE
SARS Coronavirus 2 by RT PCR: NEGATIVE

## 2022-03-19 MED ORDER — IOHEXOL 350 MG/ML SOLN
75.0000 mL | Freq: Once | INTRAVENOUS | Status: AC | PRN
  Administered 2022-03-19: 75 mL via INTRAVENOUS

## 2022-03-19 NOTE — ED Triage Notes (Signed)
Pt reports generalized weakness that started 2 days ago. Pt reports having a headache currently, feeling like her head is having pinching and tingling.Patient reports feeling like she was "in a stupor" and couldn't think clearly 2 days ago.

## 2022-03-19 NOTE — ED Notes (Signed)
Discharge instructions reviewed with patient. Patient denies any questions or concerns. Patient ambulatory out of ED.

## 2022-03-19 NOTE — ED Notes (Signed)
Patient transported to CT 

## 2022-03-19 NOTE — ED Provider Notes (Incomplete)
[facility] EMERGENCY DEPARTMENT Provider Note   CSN: [id number] Arrival date & time: 03/19/22  8315     History {Add pertinent medical, surgical, social history, OB history to HPI:1} Chief Complaint  Patient presents with   Headache    Jenny Wright is a 56 y.o. female.  HPI     56yo female with history of scoliosis with harrington rod placement at [facility], intermittent paresthesias of left upper and lower extremity, antiphospholipid antibody positive, question lupus, factor V leiden mutation complicating pregnancy who presents with concern for headache.     Pinlike feelings in head, headache  A few days before had a different feeling like in a stupor, never had that feeling before, woke up with whole left arm numb, both legs felt weak and chest hurting.  Took a bayer aspirin thinking that would help with chest pain, feeling like blood rushing to head, tingling pinching in head.  Since COVID last year has had some htn but not before that, was taken off of the htn medication recently because having tingling everywhere legs, arms.    Had seen Neurology after presentation for left arm and leg numbness,  at the time had noticed over the past few weeks she noticed when she sleeps on her left side, woke up a few hours later, transient left arm and leg numbness, changing positions helps. Episode the other day woke and felt left arm numbness as well.    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 Last Assessment & Plan:  Discussed possible hematology consult in future for more definitive dx.   Throat irritation 03/19/2019   Tinea pedis 08/29/2012   Uterine fibroid 02/13/2012   Uterine leiomyoma 02/13/2012    Home Medications Prior to Admission medications   Medication Sig Start Date End Date Taking? Authorizing Provider  ACCU-CHEK GUIDE test strip USE AS DIRECTED EVERY OTHER DAY TO CHECK FASTING MORNING BLOOD SUGARS 12/06/21   Libby Maw, MD  b complex vitamins capsule Take 1 capsule by mouth daily.    [provider]  blood glucose meter kit and supplies KIT Dispense based on patient and insurance preference.  Please check fasting morning blood sugars every other day. 09/06/21   Libby Maw, MD  cetirizine (ZYRTEC) 10 MG tablet Take 1 tablet (10 mg total) by mouth daily. 06/24/21   Libby Maw, MD  Cholecalciferol (VITAMIN D) 50 MCG (2000 UT) tablet Take 2,000 Units by mouth daily. 07/27/20   [provider]  CVS VITAMIN C 1000 MG tablet Take  2,000 mg by mouth daily. 07/27/20   [provider]  diclofenac Sodium (VOLTAREN) 1 % GEL 2 gram qid prn 09/14/21   Marcial Pacas, MD  Ferrous Sulfate (IRON PO) Take by mouth daily.    [provider]  fluticasone (FLONASE) 50 MCG/ACT nasal spray Place 2 sprays into both nostrils daily. 06/24/21   Libby Maw, MD  Multiple Vitamin (MULTI-VITAMIN) tablet Take 1 tablet by mouth daily.     [provider]  zinc gluconate 50 MG tablet Take 50 mg by mouth daily. 07/27/20   [provider]      Allergies    Penicillins, Hydrochlorothiazide, Methylprednisolone, and Omeprazole    Review of Systems   Review of Systems  Physical Exam Updated Vital Signs BP 133/82   Pulse (!) 58   Temp 98.1 F (36.7 C) (Oral)   Resp 16   Ht _0  (1.702 m)   Wt 68.9 kg   LMP 06/20/2017   SpO2 100%   BMI 23.81 kg/m  Physical Exam  ED Results / Procedures / Treatments   Labs (all labs ordered are listed, but only abnormal results are displayed) Labs Reviewed  RESP PANEL BY RT-PCR (RSV, FLU A&B, COVID)  RVPGX2  CBC WITH DIFFERENTIAL/PLATELET  COMPREHENSIVE METABOLIC PANEL  URINALYSIS, ROUTINE W REFLEX MICROSCOPIC    EKG None  Radiology No results found.  Procedures Procedures  {Document cardiac monitor, telemetry assessment procedure when appropriate:1}  Medications Ordered in ED Medications - No data to display  ED Course/ Medical Decision Making/ A&P                           Medical Decision Making Amount and/or Complexity of Data Reviewed Labs: ordered. Radiology: ordered.   ***  {Document critical care time when appropriate:1} {Document review of labs and clinical decision tools ie heart score, Chads2Vasc2 etc:1}  {Document your independent review of radiology images, and any outside records:1} {Document your discussion with family members, caretakers, and with consultants:1} {Document social determinants of health affecting pt's care:1} {Document your decision making why or why not admission, treatments were needed:1} Final Clinical Impression(s) / ED Diagnoses Final diagnoses:  None    Rx / DC Orders ED Discharge Orders     None

## 2022-03-22 ENCOUNTER — Telehealth: Payer: Self-pay | Admitting: Neurology

## 2022-03-22 NOTE — Telephone Encounter (Signed)
Pt said, would like a call back  because not clear if numbness was due to the cervical issue.

## 2022-03-23 ENCOUNTER — Other Ambulatory Visit: Payer: Self-pay

## 2022-03-23 ENCOUNTER — Telehealth: Payer: Self-pay | Admitting: Family Medicine

## 2022-03-23 DIAGNOSIS — Z79899 Other long term (current) drug therapy: Secondary | ICD-10-CM

## 2022-03-23 NOTE — Telephone Encounter (Signed)
Please give pt a call she is returning your call

## 2022-03-23 NOTE — Progress Notes (Signed)
Order placed again on follow-up as lipid clinic order not found in [place].

## 2022-03-23 NOTE — Telephone Encounter (Signed)
Called and spoke to patient about the numbness she is having, patient states that she was seen in the ER over the holidays for a numb left arm and "heavy feeling" in her head. She says she was having trouble speaking and getting out the words she wanted to say. She states they did a CT scan of her head for stroke and it was negative but she is still having occasional numbness in the left arm. Pt is unsure if it related to the nerve pain she has been here for in the past. Patient has appointment for 05/05/2022, but was requesting an earlier appointment if available but was okay to keep her appointment if not. Patient also states she went to PT as referred by Dr. Krista Blue, and the therapist is not working with her regarding her arm pain and would like it noted at the next appointment so her PT has a clearer picture of what to work on.

## 2022-03-24 NOTE — Telephone Encounter (Signed)
Response from [place], [place] read by phone rep.

## 2022-03-25 ENCOUNTER — Inpatient Hospital Stay: Payer: BC Managed Care – PPO | Admitting: Family Medicine

## 2022-03-25 ENCOUNTER — Telehealth: Payer: Self-pay

## 2022-03-25 ENCOUNTER — Telehealth: Payer: Self-pay | Admitting: Interventional Cardiology

## 2022-03-25 ENCOUNTER — Ambulatory Visit: Payer: BC Managed Care – PPO | Admitting: Family Medicine

## 2022-03-25 ENCOUNTER — Encounter: Payer: Self-pay | Admitting: Family Medicine

## 2022-03-25 VITALS — BP 122/70 | HR 94 | Temp 97.6°F | Ht 67.0 in | Wt 146.4 lb

## 2022-03-25 DIAGNOSIS — I998 Other disorder of circulatory system: Secondary | ICD-10-CM

## 2022-03-25 DIAGNOSIS — F459 Somatoform disorder, unspecified: Secondary | ICD-10-CM | POA: Insufficient documentation

## 2022-03-25 DIAGNOSIS — Z09 Encounter for follow-up examination after completed treatment for conditions other than malignant neoplasm: Secondary | ICD-10-CM

## 2022-03-25 NOTE — Telephone Encounter (Signed)
Patient has OV today, tried calling no answer LMTCB

## 2022-03-25 NOTE — Progress Notes (Unsigned)
Established Patient Office Visit   Subjective:  Patient ID: Jenny Wright, female    DOB: 14-Mar-1966  Age: 57 y.o. MRN: [id number]  Chief Complaint  Patient presents with   Hospitalization [facility] follow up, would like to discuss elevated BP levels.     HPI Encounter Diagnoses  Name Primary?   Fluctuating blood pressure Yes   Hospital discharge follow-up    Returns out of concern for blood pressure.  Status post recent ER visits for headache and paresthesias arm.  Blood pressure was normal during those visits.  Her current home blood pressure cuff is been shown to be an accurate.  Blood pressures have been normal up to today.  She was discharged from cardiology to my care with a directive that her blood pressures average less than 140/90.  They certainly have.  Today's is the highest at scene. {History (Optional):23778}  Review of Systems  Constitutional: Negative.   HENT: Negative.    Eyes:  Negative for blurred vision, discharge and redness.  Respiratory: Negative.    Cardiovascular: Negative.   Gastrointestinal:  Negative for abdominal pain.  Genitourinary: Negative.   Musculoskeletal: Negative.  Negative for myalgias.  Skin:  Negative for rash.  Neurological:  Negative for tingling, loss of consciousness and weakness.  Endo/Heme/Allergies:  Negative for polydipsia.     Current Outpatient Medications:    ACCU-CHEK GUIDE test strip, USE AS DIRECTED EVERY OTHER DAY TO CHECK FASTING MORNING BLOOD SUGARS, Disp: 50 strip, Rfl: 0   b complex vitamins capsule, Take 1 capsule by mouth daily., Disp: , Rfl:    blood glucose meter kit and supplies KIT, Dispense based on patient and insurance preference.  Please check fasting morning blood sugars every other day., Disp: 1 each, Rfl: 0   cetirizine (ZYRTEC) 10 MG tablet, Take 1 tablet (10 mg total) by mouth daily., Disp: 30 tablet, Rfl: 11   Cholecalciferol (VITAMIN D) 50 MCG (2000 UT) tablet, Take 2,000 Units by  mouth daily., Disp: , Rfl:    CVS VITAMIN C 1000 MG tablet, Take 2,000 mg by mouth daily., Disp: , Rfl:    DHA-EPA-Vitamin E (OMEGA-3 COMPLEX PO), Take by mouth., Disp: , Rfl:    diclofenac Sodium (VOLTAREN) 1 % GEL, 2 gram qid prn, Disp: 100 g, Rfl: 3   Ferrous Sulfate (IRON PO), Take by mouth daily., Disp: , Rfl:    fluticasone (FLONASE) 50 MCG/ACT nasal spray, Place 2 sprays into both nostrils daily., Disp: 16 g, Rfl: 6   Multiple Vitamin (MULTI-VITAMIN) tablet, Take 1 tablet by mouth daily., Disp: , Rfl:    zinc gluconate 50 MG tablet, Take 50 mg by mouth daily., Disp: , Rfl:    Objective:     BP (!) 142/80 (BP Location: Right Arm, Patient Position: Sitting, Cuff Size: Normal)   Pulse 94   Temp 97.6 F (36.4 C) (Temporal)   Ht '5\' 7"'$  (1.702 m)   Wt 146 lb 6.4 oz (66.4 kg)   LMP 06/20/2017   SpO2 99%   BMI 22.93 kg/m  BP Readings from Last 3 Encounters:  03/25/22 (!) 142/80  03/19/22 113/64  03/15/22 130/73   Wt Readings from Last 3 Encounters:  03/25/22 146 lb 6.4 oz (66.4 kg)  03/19/22 152 lb (68.9 kg)  02/22/22 150 lb 3.2 oz (68.1 kg)      Physical Exam Constitutional:      General: She is not in acute distress.    Appearance: Normal appearance. She  is not ill-appearing, toxic-appearing or diaphoretic.  HENT:     Head: Normocephalic and atraumatic.     Right Ear: External ear normal.     Left Ear: External ear normal.  Eyes:     General: No scleral icterus.       Right eye: No discharge.        Left eye: No discharge.     Extraocular Movements: Extraocular movements intact.     Conjunctiva/sclera: Conjunctivae normal.  Pulmonary:     Effort: Pulmonary effort is normal. No respiratory distress.  Skin:    General: Skin is warm and dry.  Neurological:     Mental Status: She is alert and oriented to person, place, and time.  Psychiatric:        Mood and Affect: Mood normal.        Behavior: Behavior normal.      No results found for any visits on  03/25/22.  {Labs (Optional):23779}  The 10-year ASCVD risk score (Arnett DK, et al., 2019) is: 11.9%    Assessment & Plan:   Fluctuating blood pressure  Hospital discharge follow-up    Return in about 6 weeks (around 05/06/2022), or Obtain a new bp cuff for the arm..  Follow-up with neurology as directed by ER.  Question somatoform disorder.  Will see her back in 1 month.  For blood pressure recheck.  Patient's blood pressure cuff was inaccurate by 20 points.  Advised to obtain a new cuff.  Libby Maw, MD

## 2022-03-25 NOTE — Telephone Encounter (Addendum)
Transition Care Management Follow-up Telephone Call Date of discharge and from where: 03/15/22. [facility] ED. Dx: Parethesia How have you been since you were released from the hospital? I'm ok Any questions or concerns? No  Items Reviewed: Did the pt receive and understand the discharge instructions provided? Yes  Medications obtained and verified? No  Other? No  Any new allergies since your discharge? No  Dietary orders reviewed? No Do you have support at home? Yes    Functional Questionnaire: (I = Independent and D = Dependent) ADLs: I  Bathing/Dressing- I  Meal Prep- I  Eating- I  Maintaining continence- I  Transferring/Ambulation- I  Managing Meds- I  Follow up appointments reviewed:  PCP Hospital f/u appt confirmed? Yes  Scheduled to see Dr. Ethelene Hal on 03/25/2022 @ 3:00pm. [facility] f/u appt confirmed? No  Scheduled to see n/a on n/a @ n/a. Are transportation arrangements needed? No  If their condition worsens, is the pt aware to call PCP or go to the Emergency Dept.? Yes Was the patient provided with contact information for the PCP's office or ED? Yes Was to pt encouraged to call back with questions or concerns? Yes   Angeline Slim, RN, BSN RN Clinical Supervisor [facility] [employer]

## 2022-03-25 NOTE — Telephone Encounter (Signed)
Transition Care Management Follow-up Telephone Call Date of discharge and from where: 03/19/22 [facility] ED. Dx: Nonintractable headache How have you been since you were released from the hospital? I'm ok Any questions or concerns? No  Items Reviewed: Did the pt receive and understand the discharge instructions provided? Yes  Medications obtained and verified? No  Other? No  Any new allergies since your discharge? No  Dietary orders reviewed? No Do you have support at home? Yes    Functional Questionnaire: (I = Independent and D = Dependent) ADLs: I  Bathing/Dressing- I  Meal Prep- I  Eating- I  Maintaining continence- I  Transferring/Ambulation- I  Managing Meds- I  Follow up appointments reviewed:  PCP Hospital f/u appt confirmed? Yes  Scheduled to see Dr. Ethelene Hal on 03/25/2022 @ 3:00pm. [facility] f/u appt confirmed? Yes  Scheduled to see Dr. Kathlen Mody on 03/28/22 @ 10:55am. Are transportation arrangements needed? Yes  If their condition worsens, is the pt aware to call PCP or go to the Emergency Dept.? Yes Was the patient provided with contact information for the PCP's office or ED? Yes Was to pt encouraged to call back with questions or concerns? Yes  Angeline Slim, RN, BSN RN Clinical Supervisor [facility] [employer]

## 2022-03-25 NOTE — Telephone Encounter (Signed)
Patient is calling and said that Dr. Tamala Julian had given her some directions in regards to her BP and also to her PCP. Please call back to discuss

## 2022-03-27 NOTE — Progress Notes (Signed)
Cardiology Office Note:    Date:  03/28/2022   ID:  Jenny Wright, DOB [birth year], MRN [id number]  PCP:  Mliss Sax, MD  [facility] Providers Cardiologist:  Lesleigh Noe, MD     Referring MD: Mliss Sax,*   Chief Complaint:  F/u on BP    Patient Profile: Labile hypertension Factor V Leiden mutation Complicated pregnancy, preeclampsia CAC score 11/13/2020: CAC score 0 TTE 09/11/2020 [facility]): Mild LVH, EF 55-60 Hyperlipidemia  Lupus Scoliosis s/p Harrington Rod placement     History of Present Illness:   Jenny Wright is a 57 y.o. female with the above problem list.  She was last seen by Dr. Katrinka Blazing 09/17/2021.  She had stopped hydrochlorothiazide secondary to numbness and tingling.  Her blood pressure was controlled off of hydrochlorothiazide.  Continued monitoring was recommended.  She was seen in the emergency room 12/26 with left arm and bilateral leg numbness and chest pain.  EKG demonstrated sinus bradycardia, HR 53, normal axis, nonspecific ST-T wave changes, QTc 388. Head CT at that time was negative.  She went to the emergency room again on 03/19/2022 with complaints of a headache.  CT was negative for bleed.  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  CT venogram was normal.  Case was discussed with neurosurgery.  Recommendation was to follow-up with Dr. Conchita Paris as an outpatient.  She has also seen Dr. Terrace Arabia with Neuro in the past for paresthesias.   She is here alone. She mainly scheduled her appt today to discuss her BP and chol. She has an appt with neurosurgery later this week. I have advised her that the neurosurgeon can answer her questions about the aneurysm and what management is needed. She has an appt with Dr. Terrace Arabia in Feb. She has had a lot of paresthesias. I advised her to call to see if she can get a sooner f/u with neurology. I reviewed all of her BPs in  Epic. Her BPs have mostly been optimal. She has a machine at home and has been told that it reads higher than an office mercury cuff. She notes that she ate poorly prior to her recent Lipid panel. She would like to recheck this before being committed to chol medications.       EKG:  not done    Reviewed and updated this encounter:   Tobacco  Allergies  Meds  Problems  Med Hx  Surg Hx  Fam Hx     ROS  Labs/Other Test Reviewed:   Recent Labs: 08/24/2021: TSH 2.601 09/10/2021: Magnesium 2.1 03/19/2022: ALT 15; BUN 11; Creatinine, Ser 0.86; Hemoglobin 12.1; Platelets 160; Potassium 4.0; Sodium 141  Recent Lipid Panel Recent Labs    06/10/21 0755 02/22/22 0839  CHOL  --  253*  TRIG  --  75.0  HDL  --  67.80  VLDL  --  08.6  LDLCALC  --  170*  LDLDIRECT 120.0  --     Risk Assessment/Calculations/Metrics:             Physical Exam:   VS:  BP 120/70   Pulse 60   Ht 5\' 7"  (1.702 m)   Wt 148 lb 6.4 oz (67.3 kg)   LMP 06/20/2017   SpO2 98%   BMI 23.24 kg/m    Wt Readings from Last 3 Encounters:  03/28/22 148 lb 6.4 oz (67.3 kg)  03/25/22 146 lb 6.4  oz (66.4 kg)  03/19/22 152 lb (68.9 kg)    Constitutional:      Appearance: Healthy appearance. Not in distress.  Pulmonary:     Breath sounds: Normal breath sounds. No rales.  Cardiovascular:     Normal rate. Regular rhythm.     Murmurs: There is no murmur.  Edema:    Peripheral edema absent.          ASSESSMENT & PLAN:   Essential hypertension She was previously on medication for BP but had side effects and these were stopped (metoprolol and HCTZ). HCTZ was stopped in June 2023. Her side effects resolved with this. She has had mostly optimal BPs since. We discussed the appropriate way to monitor BP at home. She has been told by someone that her machine is reading higher than an office cuff. I will see if we can provide her a new BP machine. I have also asked her to check with the mfg. She has f/u with Dr. Duke Salvia in  April. She can f/u at that time. I have asked her contact us if she sees that her BP is running higher. She already does a good job at limiting salt in her diet.   Pure hypercholesterolemia LDL 12/5 was 170. She admits to poor diet prior to this. Her LDL in the past was ~ 130. She would like to recheck her labs prior to committing to meds. I think this is reasonable. CAC Score last year was 0.  -Arrange fasting Lipids 1 week prior to f/u with Dr. Duke Salvia.   Total time spent with patient today and reviewing records today was 36 mins.         Dispo:  Return in 4 months (on 07/13/2022) for Scheduled Follow Up w/ Dr. Duke Salvia.  Medication Adjustments/Labs and Tests Ordered: Current medicines are reviewed at length with the patient today.  Concerns regarding medicines are outlined above.  Tests Ordered: Orders Placed This Encounter  Procedures   Lipid panel   Medication Changes: No orders of the defined types were placed in this encounter.  Signed, Tereso Newcomer, PA-C  03/28/2022 12:26 PM    [facility] [address] [place], [place], [place]  [address] Phone: [phone]; Fax: [phone]

## 2022-03-28 ENCOUNTER — Encounter: Payer: Self-pay | Admitting: Physician Assistant

## 2022-03-28 ENCOUNTER — Ambulatory Visit: Payer: BC Managed Care – PPO | Attending: Physician Assistant | Admitting: Physician Assistant

## 2022-03-28 VITALS — BP 120/70 | HR 60 | Ht 67.0 in | Wt 148.4 lb

## 2022-03-28 DIAGNOSIS — I10 Essential (primary) hypertension: Secondary | ICD-10-CM | POA: Diagnosis not present

## 2022-03-28 DIAGNOSIS — E7800 Pure hypercholesterolemia, unspecified: Secondary | ICD-10-CM

## 2022-03-28 NOTE — Assessment & Plan Note (Signed)
She was previously on medication for BP but had side effects and these were stopped (metoprolol and HCTZ). HCTZ was stopped in June 2023. Her side effects resolved with this. She has had mostly optimal BPs since. We discussed the appropriate way to monitor BP at home. She has been told by someone that her machine is reading higher than an office cuff. I will see if we can provide her a new BP machine. I have also asked her to check with the mfg. She has f/u with Dr. Oval Linsey in April. She can f/u at that time. I have asked her contact us if she sees that her BP is running higher. She already does a good job at limiting salt in her diet.

## 2022-03-28 NOTE — Assessment & Plan Note (Signed)
LDL 12/5 was 170. She admits to poor diet prior to this. Her LDL in the past was ~ 130. She would like to recheck her labs prior to committing to meds. I think this is reasonable. CAC Score last year was 0.  -Arrange fasting Lipids 1 week prior to f/u with Dr. Oval Linsey.

## 2022-03-28 NOTE — Patient Instructions (Addendum)
Medication Instructions:  Your physician recommends that you continue on your current medications as directed. Please refer to the Current Medication list given to you today.   *If you need a refill on your cardiac medications before your next appointment, please call your pharmacy*   Lab Work: 1 WEEK BEFORE APPT:  LIPID  06/24/22 COME TO THE LAB ANYTIME AFTER 7:15 A.M, MAKE SURE YOU ARE FASTING..   If you have labs (blood work) drawn today and your tests are completely normal, you will receive your results only by: [facility] (if you have MyChart) OR A paper copy in the mail If you have any lab test that is abnormal or we need to change your treatment, we will call you to review the results.   Testing/Procedures: None ordered   Follow-Up: At [facility], you and your health needs are our priority.  As part of our continuing mission to provide you with exceptional heart care, we have created designated Provider Care Teams.  These Care Teams include your primary Cardiologist (physician) and Advanced Practice Providers (APPs -  Physician Assistants and Nurse Practitioners) who all work together to provide you with the care you need, when you need it.  We recommend signing up for the patient portal called "MyChart".  Sign up information is provided on this After Visit Summary.  MyChart is used to connect with patients for Virtual Visits (Telemedicine).  Patients are able to view lab/test results, encounter notes, upcoming appointments, etc.  Non-urgent messages can be sent to your provider as well.   To learn more about what you can do with MyChart, go to [email or web address].    Your next appointment:   As scheduled   The format for your next appointment:   In Person  Provider:   Skeet Latch, MD    Other Instructions   Important Information About Sugar

## 2022-03-31 DIAGNOSIS — I671 Cerebral aneurysm, nonruptured: Secondary | ICD-10-CM | POA: Diagnosis not present

## 2022-04-19 ENCOUNTER — Ambulatory Visit
Admission: RE | Admit: 2022-04-19 | Discharge: 2022-04-19 | Disposition: A | Discharge status: Home / Self Care | Payer: BC Managed Care – PPO | Source: Ambulatory Visit | Attending: Family Medicine | Admitting: Family Medicine

## 2022-04-19 DIAGNOSIS — Z1231 Encounter for screening mammogram for malignant neoplasm of breast: Secondary | ICD-10-CM | POA: Diagnosis not present

## 2022-04-21 ENCOUNTER — Other Ambulatory Visit: Payer: Self-pay | Admitting: Family Medicine

## 2022-04-21 DIAGNOSIS — R928 Other abnormal and inconclusive findings on diagnostic imaging of breast: Secondary | ICD-10-CM

## 2022-05-03 ENCOUNTER — Ambulatory Visit
Admission: RE | Admit: 2022-05-03 | Discharge: 2022-05-03 | Disposition: A | Discharge status: Home / Self Care | Payer: BC Managed Care – PPO | Source: Ambulatory Visit | Attending: Family Medicine | Admitting: Family Medicine

## 2022-05-03 DIAGNOSIS — R921 Mammographic calcification found on diagnostic imaging of breast: Secondary | ICD-10-CM | POA: Diagnosis not present

## 2022-05-03 DIAGNOSIS — R928 Other abnormal and inconclusive findings on diagnostic imaging of breast: Secondary | ICD-10-CM

## 2022-05-04 ENCOUNTER — Other Ambulatory Visit: Payer: Self-pay | Admitting: Family Medicine

## 2022-05-04 DIAGNOSIS — R921 Mammographic calcification found on diagnostic imaging of breast: Secondary | ICD-10-CM

## 2022-05-05 ENCOUNTER — Encounter: Payer: Self-pay | Admitting: Neurology

## 2022-05-05 ENCOUNTER — Ambulatory Visit: Payer: BC Managed Care – PPO | Admitting: Neurology

## 2022-05-05 VITALS — BP 117/71 | HR 65 | Ht 67.0 in | Wt 148.0 lb

## 2022-05-05 DIAGNOSIS — M4120 Other idiopathic scoliosis, site unspecified: Secondary | ICD-10-CM | POA: Diagnosis not present

## 2022-05-05 DIAGNOSIS — R202 Paresthesia of skin: Secondary | ICD-10-CM | POA: Diagnosis not present

## 2022-05-05 DIAGNOSIS — M47812 Spondylosis without myelopathy or radiculopathy, cervical region: Secondary | ICD-10-CM | POA: Diagnosis not present
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
## 2022-05-06 DIAGNOSIS — N644 Mastodynia: Secondary | ICD-10-CM | POA: Diagnosis not present

## 2022-05-06 DIAGNOSIS — Z1501 Genetic susceptibility to malignant neoplasm of breast: Secondary | ICD-10-CM | POA: Diagnosis not present

## 2022-05-06 DIAGNOSIS — Z803 Family history of malignant neoplasm of breast: Secondary | ICD-10-CM | POA: Diagnosis not present

## 2022-05-06 DIAGNOSIS — Z8042 Family history of malignant neoplasm of prostate: Secondary | ICD-10-CM | POA: Diagnosis not present

## 2022-05-12 ENCOUNTER — Ambulatory Visit
Admission: RE | Admit: 2022-05-12 | Discharge: 2022-05-12 | Disposition: A | Discharge status: Home / Self Care | Payer: BC Managed Care – PPO | Source: Ambulatory Visit | Attending: Family Medicine | Admitting: Family Medicine

## 2022-05-12 DIAGNOSIS — R921 Mammographic calcification found on diagnostic imaging of breast: Secondary | ICD-10-CM | POA: Diagnosis not present

## 2022-05-12 DIAGNOSIS — N6011 Diffuse cystic mastopathy of right breast: Secondary | ICD-10-CM | POA: Diagnosis not present

## 2022-05-12 HISTORY — PX: BREAST BIOPSY: SHX20

## 2022-06-07 ENCOUNTER — Telehealth: Payer: Self-pay | Admitting: Physician Assistant

## 2022-06-07 DIAGNOSIS — E7800 Pure hypercholesterolemia, unspecified: Secondary | ICD-10-CM

## 2022-06-07 NOTE — Telephone Encounter (Signed)
Returned call to pt, she has been made aware that we have her scheduled for a Lipid panel.  She said that she wants Her "LDL" to be broken down further, but couldn't remember what it was that she heard on a podcast.  Do we do a lab work that "breaks" down the LDL?

## 2022-06-07 NOTE — Telephone Encounter (Signed)
  Pt wants to confirm what kind of lab work she's scheduled for. She wants to make sure it is the specific cholesterol check that she wants to get, she said, she discuss from her last visit.

## 2022-06-07 NOTE — Telephone Encounter (Signed)
I think she is probably referring to particle size. We can change her Lipid panel to a Lipomed panel (I pended the orders). Please check with the lab to make sure the Apo B needs to be ordered separately. She does not need the regular fasting Lipid panel. Richardson Dopp, PA-C    06/07/2022 1:46 PM

## 2022-06-07 NOTE — Telephone Encounter (Signed)
Left pt a detailed message that we have changed her lab orders and if she had any questions, to call us back.

## 2022-06-08 DIAGNOSIS — B351 Tinea unguium: Secondary | ICD-10-CM | POA: Diagnosis not present

## 2022-06-08 DIAGNOSIS — M79672 Pain in left foot: Secondary | ICD-10-CM | POA: Diagnosis not present

## 2022-06-08 DIAGNOSIS — M79671 Pain in right foot: Secondary | ICD-10-CM | POA: Diagnosis not present

## 2022-06-24 ENCOUNTER — Ambulatory Visit: Payer: BC Managed Care – PPO | Attending: Physician Assistant

## 2022-06-24 DIAGNOSIS — E7800 Pure hypercholesterolemia, unspecified: Secondary | ICD-10-CM

## 2022-06-25 LAB — NMR, LIPOPROFILE
Cholesterol, Total: 232 mg/dL — ABNORMAL HIGH (ref 100–199)
HDL Particle Number: 31.3 umol/L (ref >=30.5)
HDL-C: 71 mg/dL (ref >39)
LDL Particle Number: 1761 nmol/L — ABNORMAL HIGH (ref <1000)
LDL Size: 21.3 nm (ref >20.5)
LDL-C (NIH Calc): 147 mg/dL — ABNORMAL HIGH (ref 0–99)
LP-IR Score: <25 (ref <=45)
Small LDL Particle Number: 523 nmol/L (ref <=527)
Triglycerides: 79 mg/dL (ref 0–149)

## 2022-06-25 LAB — APOLIPOPROTEIN B: Apolipoprotein B: 110 mg/dL — ABNORMAL HIGH (ref <90)

## 2022-07-01 ENCOUNTER — Encounter (HOSPITAL_BASED_OUTPATIENT_CLINIC_OR_DEPARTMENT_OTHER): Payer: Self-pay | Admitting: Cardiovascular Disease

## 2022-07-01 ENCOUNTER — Ambulatory Visit (HOSPITAL_BASED_OUTPATIENT_CLINIC_OR_DEPARTMENT_OTHER): Payer: BC Managed Care – PPO | Admitting: Cardiovascular Disease

## 2022-07-01 VITALS — BP 137/83 | HR 63 | Ht 66.0 in | Wt 156.0 lb

## 2022-07-01 DIAGNOSIS — I10 Essential (primary) hypertension: Secondary | ICD-10-CM | POA: Diagnosis not present

## 2022-07-01 DIAGNOSIS — E7800 Pure hypercholesterolemia, unspecified: Secondary | ICD-10-CM | POA: Diagnosis not present

## 2022-07-01 NOTE — Assessment & Plan Note (Signed)
Blood pressure has been intermittently elevated.  She has not tolerated HCTZ or metoprolol.  BP was initially elevated today but she was rushing to get here on time.  It improved on repeat.  She is going to work on diet and exercise.  Continue to monitor for now.  Her blood pressure goal is less than 130/80.

## 2022-07-01 NOTE — Progress Notes (Signed)
Cardiology Office Note   Date:  07/01/2022   ID:  Jenny Wright, DOB 1965/12/22, MRN [id number]  PCP:  Mliss Sax, MD  Cardiologist:   Chilton Si, MD   No chief complaint on file.  History of Present Illness: Jenny Wright is a 57 y.o. female with Factor V Leiden, pre-eclampsioa, SLE, R ICA aneurysm, and hyperlipidemia who presents for follow up.  She was previously a patient of Dr. Katrinka Blazing.  She has been treated with HCTZ for hypertension but subsequently discontinued 02/2022 due to controlled BP.  She was seen in the ED in 2020 with chest pain.  She also had an echo at [facility] 08/2020 that revealed LVEF 55 to 60%, mild LVH, and normal diastolic function.  Coronary calcium score was 0 on 10/2020.  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  She last saw Tereso Newcomer, PA-C on 03/2022 and she was feeling well.  There was some concern about her blood pressure possibly still running high at home and she was asked to check her pressure at home.  There was a discussion about statins and she wanted to work on diet and exercise.  Jenny Wright presents today to discuss her elevated LDL levels and explore ways to manage it through diet and lifestyle changes. She reports exercising for 150 minutes per week, including 20 minutes on a stationary recumbent bike, walking, and 20 minutes on a treadmill during physical therapy sessions. She states that she feels great during exercise.  She has no chest pain or SOB.   The patient acknowledges that her diet was not optimal during the COVID pandemic and is currently trying to improve it. She occasionally goes to [employer] for breakfast but is aware that white flour is not beneficial for her. She used to consume salads with nuts and green tea with honey for breakfast and is seeking a fast and healthy breakfast regimen. She has heard about protein shakes but has not tried them yet.  Jenny Wright  has been monitoring her blood pressure at home, reporting fluctuations with readings of 150 and 117. She admits to not resting for five minutes before checking her blood pressure. She has been attempting to control her salt intake and cooks with minimal salt.  Prior antihypertensives:  HCTZ Metoprolol  Past Medical History:  Diagnosis Date   Acute bilateral low back pain without sciatica 10/01/2020   Anemia    Antiphospholipid antibody positive 02/13/2012   Arthralgia of right temporomandibular joint 01/03/2017   Bell's palsy    Bilateral impacted cerumen 09/29/2015   BPPV (benign paroxysmal positional vertigo) 09/29/2015   DDD (degenerative disc disease), lumbar 10/07/2014   Factor V Leiden mutation complicating pregnancy    Herpes simplex type 2 infection 02/01/2013   History of COVID-19 09/02/2020   Laryngopharyngeal reflux 06/29/2020   Menstrual migraine without status migrainosus, not intractable 03/12/2014   Miscarriage    Onychomycosis 08/29/2012   Preeclampsia    pregnancy   Rash and nonspecific skin eruption 08/29/2012   Scoliosis    Seasonal allergic rhinitis 01/03/2017   Sensorineural hearing loss (SNHL) of both ears 09/29/2015   Sinus congestion 09/02/2020   Systemic lupus erythematosus (SLE) inhibitor 02/13/2012   Formatting of this note might be different from the original. Last Assessment & Plan:  Discussed possible hematology consult in future for more definitive dx.   Throat irritation 03/19/2019   Tinea pedis 08/29/2012   Uterine fibroid 02/13/2012  Uterine leiomyoma 02/13/2012    Past Surgical History:  Procedure Laterality Date   BACK SURGERY     BREAST BIOPSY Right 05/12/2022   MM RT BREAST BX W LOC DEV 1ST LESION IMAGE BX SPEC STEREO GUIDE 05/12/2022 [facility]   CESAREAN SECTION     harrington rod  1980     Current Outpatient Medications  Medication Sig Dispense Refill   ACCU-CHEK GUIDE test strip USE AS DIRECTED EVERY OTHER DAY TO CHECK FASTING  MORNING BLOOD SUGARS 50 strip 0   b complex vitamins capsule Take 1 capsule by mouth daily.     blood glucose meter kit and supplies KIT Dispense based on patient and insurance preference.  Please check fasting morning blood sugars every other day. 1 each 0   cetirizine (ZYRTEC) 10 MG tablet Take 1 tablet (10 mg total) by mouth daily. 30 tablet 11   Cholecalciferol (VITAMIN D) 50 MCG (2000 UT) tablet Take 2,000 Units by mouth daily.     CVS VITAMIN C 1000 MG tablet Take 2,000 mg by mouth daily.     diclofenac Sodium (VOLTAREN) 1 % GEL 2 gram qid prn 100 g 3   Ferrous Sulfate (IRON PO) Take by mouth daily.     fluticasone (FLONASE) 50 MCG/ACT nasal spray Place 2 sprays into both nostrils daily. 16 g 6   Multiple Vitamin (MULTI-VITAMIN) tablet Take 1 tablet by mouth daily.     zinc gluconate 50 MG tablet Take 50 mg by mouth daily.     No current facility-administered medications for this visit.    Allergies:   Penicillins, Hydrochlorothiazide, Methylprednisolone, and Omeprazole    Social History:  The patient  reports that she has never smoked. She has never used smokeless tobacco. She reports that she does not drink alcohol and does not use drugs.   Family History:  The patient's family history includes Breast cancer in her mother; Heart disease in her father; Lung cancer in her brother.    ROS:  Please see the history of present illness.   Otherwise, review of systems are positive for none.   All other systems are reviewed and negative.    PHYSICAL EXAM: VS:  BP 137/83 (BP Location: Right Arm, Patient Position: Sitting, Cuff Size: Normal)   Pulse 63   Ht 5\' 6"  (1.676 m)   Wt 156 lb (70.8 kg)   LMP 06/20/2017   SpO2 100%   BMI 25.18 kg/m  , BMI Body mass index is 25.18 kg/m. GENERAL:  Well appearing HEENT:  Pupils equal round and reactive, fundi not visualized, oral mucosa unremarkable NECK:  No jugular venous distention, waveform within normal limits, carotid upstroke brisk and  symmetric, no bruits, no thyromegaly LUNGS:  Clear to auscultation bilaterally HEART:  RRR.  PMI not displaced or sustained,S1 and S2 within normal limits, no S3, no S4, no clicks, no rubs, no murmurs ABD:  Flat, positive bowel sounds normal in frequency in pitch, no bruits, no rebound, no guarding, no midline pulsatile mass, no hepatomegaly, no splenomegaly EXT:  2 plus pulses throughout, no edema, no cyanosis no clubbing SKIN:  No rashes no nodules NEURO:  Cranial nerves II through XII grossly intact, motor grossly intact throughout PSYCH:  Cognitively intact, oriented to person place and time  EKG:  EKG is not ordered today.   Recent Labs: 08/24/2021: TSH 2.601 09/10/2021: Magnesium 2.1 03/19/2022: ALT 15; BUN 11; Creatinine, Ser 0.86; Hemoglobin 12.1; Platelets 160; Potassium 4.0; Sodium 141    Lipid  Panel    Component Value Date/Time   CHOL 253 (H) 02/22/2022 0839   TRIG 75.0 02/22/2022 0839   HDL 67.80 02/22/2022 0839   CHOLHDL 4 02/22/2022 0839   VLDL 15.0 02/22/2022 0839   LDLCALC 170 (H) 02/22/2022 0839   LDLDIRECT 120.0 06/10/2021 0755      Wt Readings from Last 3 Encounters:  07/01/22 156 lb (70.8 kg)  05/05/22 148 lb (67.1 kg)  03/28/22 148 lb 6.4 oz (67.3 kg)      ASSESSMENT AND PLAN:   Essential hypertension Blood pressure has been intermittently elevated.  She has not tolerated HCTZ or metoprolol.  BP was initially elevated today but she was rushing to get here on time.  It improved on repeat.  She is going to work on diet and exercise.  Continue to monitor for now.  Her blood pressure goal is less than 130/80.  Pure hypercholesterolemia Lipids were reviewed in clinic today.  Her coronary calcium score is 0.  She is very interested in working on lifestyle if possible.  We will refer her to the PREP program at the [facility].  She will increase her exercise to at least 150 minutes weekly.  Continue to limit animal products.  We discussed trying protein shakes and  other non-animal product proteins.  We will repeat an NMR and an LP(a) in 3 to 4 months.  She is open to starting medication if her LDL is not at least less than 130 at follow-up.  Plan to repeat coronary calcium score in 3 years.    Current medicines are reviewed at length with the patient today.  The patient does not have concerns regarding medicines.  The following changes have been made:  no change  Labs/ tests ordered today include:   Orders Placed This Encounter  Procedures   Lipid panel   Comprehensive metabolic panel   NMR, lipoprofile   Lipoprotein A (LPA)   Amb Referral To Provider Referral Exercise Program (P.R.E.P)     Disposition:   FU with Jerimyah Vandunk C. Duke Salvia, MD, [facility] in 5-6 months     Signed, Natalyn Szymanowski C. Duke Salvia, MD, [facility]  07/01/2022 10:55 AM    [facility]

## 2022-07-01 NOTE — Patient Instructions (Signed)
Medication Instructions:  Your physician recommends that you continue on your current medications as directed. Please refer to the Current Medication list given to you today.   *If you need a refill on your cardiac medications before your next appointment, please call your pharmacy*  Lab Work: FASTING LP/CMET/NMR/LPa IN 3 MONTHS   If you have labs (blood work) drawn today and your tests are completely normal, you will receive your results only by: MyChart Message (if you have MyChart) OR A paper copy in the mail If you have any lab test that is abnormal or we need to change your treatment, we will call you to review the results.  Testing/Procedures: NONE  Follow-Up: At [facility], you and your health needs are our priority.  As part of our continuing mission to provide you with exceptional heart care, we have created designated Provider Care Teams.  These Care Teams include your primary Cardiologist (physician) and Advanced Practice Providers (APPs -  Physician Assistants and Nurse Practitioners) who all work together to provide you with the care you need, when you need it.  We recommend signing up for the patient portal called "MyChart".  Sign up information is provided on this After Visit Summary.  MyChart is used to connect with patients for Virtual Visits (Telemedicine).  Patients are able to view lab/test results, encounter notes, upcoming appointments, etc.  Non-urgent messages can be sent to your provider as well.   To learn more about what you can do with MyChart, go to [email or web address].    Your next appointment:   AFTER LABS (NEXT AVAILABLE)   You have been referred to PREP Other Instructions Exercise recommendations: The American Heart Association recommends 150 minutes of moderate intensity exercise weekly. Try 30 minutes of moderate intensity exercise 4-5 times per week. This could include walking, jogging, or swimming.

## 2022-07-01 NOTE — Assessment & Plan Note (Signed)
Lipids were reviewed in clinic today.  Her coronary calcium score is 0.  She is very interested in working on lifestyle if possible.  We will refer her to the PREP program at the [facility].  She will increase her exercise to at least 150 minutes weekly.  Continue to limit animal products.  We discussed trying protein shakes and other non-animal product proteins.  We will repeat an NMR and an LP(a) in 3 to 4 months.  She is open to starting medication if her LDL is not at least less than 130 at follow-up.  Plan to repeat coronary calcium score in 3 years.

## 2022-07-08 ENCOUNTER — Telehealth: Payer: Self-pay | Admitting: Cardiovascular Disease

## 2022-07-08 NOTE — Telephone Encounter (Signed)
Returned call to patient,   Patient states she experienced palpitations in her chest, this happened at night when she was laying down, pressure  was in the 140s when this occurred. She states her feet started itching and she had spots, she wants to know if her cholesterol numbers could be pushing her into maybe being diabetic. She states she wonders if she can be prediabetic and have diabetic symptoms. Explained that prediabetes and and cholesterol numbers are not related. Explained she would need to reach out to primary care to have A1c checked if she was concerned about her prediabetes trending more towards diabetic levels.   Explained to patient that it was decided at her appointment to let her work on diet and lifestyle and then recheck her in 3 months. She states that she doesn't know if she wants to work on diet & lifestyle or take something. She would prefer that Dr. Duke Salvia decide whether or not she should be on something. She does state that if she is going to be placed on something she wants something with the least amount of side effects, she states that she tends to be reactive to medications. She states she was talking to her friends in the beauty salon and they scared her because she wasn't on a statin. Explained to patient that she should focus on taking medical advice from her doctor and not people in the hair salon.   She says she is going to take the weekend to think about wearing a heart monitor for the palpitations and whether or not she wants to start taking something now. She will call next week when she decides what she would like to do.

## 2022-07-08 NOTE — Telephone Encounter (Signed)
Patient is calling requesting to speak with a nurse. Patient did not express what it was in regards to.

## 2022-07-12 ENCOUNTER — Ambulatory Visit: Payer: BC Managed Care – PPO | Admitting: Nurse Practitioner

## 2022-07-12 ENCOUNTER — Encounter: Payer: Self-pay | Admitting: Nurse Practitioner

## 2022-07-12 VITALS — BP 130/84 | HR 69 | Temp 97.8°F | Resp 16 | Ht 66.0 in | Wt 152.6 lb

## 2022-07-12 DIAGNOSIS — B353 Tinea pedis: Secondary | ICD-10-CM | POA: Diagnosis not present

## 2022-07-12 DIAGNOSIS — B351 Tinea unguium: Secondary | ICD-10-CM | POA: Diagnosis not present

## 2022-07-12 NOTE — Progress Notes (Signed)
Acute Office Visit  Subjective:    Patient ID: Jenny Wright, female    DOB: [birth year], 57 y.o.   MRN: [id number]  Chief Complaint  Patient presents with   Rash    Pt c/o dry scaly skin on the bottom of both feet and top of right foot.  The top of right foot itched a little bit but now it's just dry skin   HPI Patient is in today for dry skin and itching on both feet, onset several months ago, No OTC med used.  Outpatient Medications Prior to Visit  Medication Sig   ACCU-CHEK GUIDE test strip USE AS DIRECTED EVERY OTHER DAY TO CHECK FASTING MORNING BLOOD SUGARS   b complex vitamins capsule Take 1 capsule by mouth daily.   blood glucose meter kit and supplies KIT Dispense based on patient and insurance preference.  Please check fasting morning blood sugars every other day.   cetirizine (ZYRTEC) 10 MG tablet Take 1 tablet (10 mg total) by mouth daily.   Cholecalciferol (VITAMIN D) 50 MCG (2000 UT) tablet Take 2,000 Units by mouth daily.   CVS VITAMIN C 1000 MG tablet Take 2,000 mg by mouth daily.   diclofenac Sodium (VOLTAREN) 1 % GEL 2 gram qid prn   Ferrous Sulfate (IRON PO) Take by mouth daily.   fluticasone (FLONASE) 50 MCG/ACT nasal spray Place 2 sprays into both nostrils daily.   Multiple Vitamin (MULTI-VITAMIN) tablet Take 1 tablet by mouth daily.   zinc gluconate 50 MG tablet Take 50 mg by mouth daily.   No facility-administered medications prior to visit.   Reviewed past medical and social history.  Review of Systems Per HPI     Objective:    Physical Exam Vitals reviewed.  Cardiovascular:     Rate and Rhythm: Normal rate.     Pulses: Normal pulses.          Dorsalis pedis pulses are 2+ on the right side and 2+ on the left side.       Posterior tibial pulses are 2+ on the right side and 2+ on the left side.  Pulmonary:     Effort: Pulmonary effort is normal.  Feet:     Right foot:     Skin integrity: Dry skin present. No ulcer, blister, skin breakdown,  erythema or fissure.     Toenail Condition: Right toenails are abnormally thick. Fungal disease present.    Left foot:     Skin integrity: Dry skin present. No ulcer, blister, skin breakdown, erythema or fissure.     Toenail Condition: Left toenails are abnormally thick. Fungal disease present.    Comments: Between toes and sole on feet Neurological:     Mental Status: She is alert.    BP 130/84 (BP Location: Right Arm, Patient Position: Sitting, Cuff Size: Large)   Pulse 69   Temp 97.8 F (36.6 C) (Temporal)   Resp 16   Ht  (1.676 m)   Wt 152 lb 9.6 oz (69.2 kg)   LMP 06/20/2017   SpO2 97%   BMI 24.63 kg/m    No results found for any visits on 07/12/22.     Assessment & Plan:   Problem List Items Addressed This Visit       Musculoskeletal and Integument   Onychomycosis   Tinea pedis - Primary  She declined use of oral antifungal due to possible side effects. Advised to Use vinegar and water foot soaks daily, and Use lamisil cream  or miconazole powder on feet.  No orders of the defined types were placed in this encounter.  Return if symptoms worsen or fail to improve.    Alysia Penna, NP

## 2022-07-12 NOTE — Patient Instructions (Addendum)
CT ABD/pelvis 2018: does not indicate presence of fatty liver Last hgbA1c at 6.0%: prediabetes  Use vinegar and water foot soaks daily. Use lamisil cream  or miconazole powder on feet.  Athlete's Foot Athlete's foot (tinea pedis) is a fungal infection of the skin on your feet. It often occurs on the skin that is between or underneath the toes. It can also occur on the soles of your feet. The infection can spread from person to person (is contagious). It can also spread when a person's bare feet come in contact with the fungus on shower floors or on items such as shoes. What are the causes? This condition is caused by a fungus that grows in warm, moist places. You can get athlete's foot by sharing shoes, shower stalls, towels, and wet floors with someone who is infected. Not washing your feet or changing your socks often enough can also lead to athlete's foot. What increases the risk? This condition is more likely to develop in: Men. People who have a weak body defense system (immune system). People who have diabetes. People who use public showers, such as at a gym. People who wear heavy-duty shoes, such as Youth worker. Seasons with warm, humid weather. What are the signs or symptoms? Symptoms of this condition include: Itchy areas between your toes or on the soles of your feet. White, flaky, or scaly areas between your toes or on the soles of your feet. Very itchy small blisters between your toes or on the soles of your feet. Small cuts in your skin. These cuts can become infected. Thick or discolored toenails. How is this diagnosed? This condition may be diagnosed with a physical exam and a review of your medical history. Your health care provider may also take a skin or toenail sample to examine under a microscope. How is this treated? This condition is treated with antifungal medicines. These may be applied as powders, ointments, or creams. In severe cases, an oral  antifungal medicine may be given. Follow these instructions at home: Medicines Apply or take over-the-counter and prescription medicines only as told by your health care provider. Apply your antifungal medicine as told by your health care provider. Do not stop using the antifungal even if your condition improves. Foot care Do not scratch your feet. Keep your feet dry: Wear cotton or wool socks. Change your socks every day or if they become wet. Wear shoes that allow air to flow, such as sandals or canvas tennis shoes. Wash and dry your feet, including the area between your toes. Also, wash and dry your feet: Every day or as told by your health care provider. After exercising. General instructions Do not let others use towels, shoes, nail clippers, or other personal items that touch your feet. Protect your feet by wearing sandals in wet areas, such as locker rooms and shared showers. Keep all follow-up visits. This is important. If you have diabetes, keep your blood sugar under control. Contact a health care provider if: You have a fever. You have swelling, soreness, warmth, or redness in your foot. Your feet are not getting better with treatment. Your symptoms get worse. You have new symptoms. You have severe pain. Summary Athlete's foot (tinea pedis) is a fungal infection of the skin on your feet. It often occurs on skin that is between or underneath the toes. This condition is caused by a fungus that grows in warm, moist places. Symptoms include white, flaky, or scaly areas between your toes or  on the soles of your feet. This condition is treated with antifungal medicines. Keep your feet clean. Always dry them thoroughly. This information is not intended to replace advice given to you by your health care provider. Make sure you discuss any questions you have with your health care provider. Document Revised: 06/28/2020 Document Reviewed: 06/28/2020 Elsevier Patient Education  2023  [employer].

## 2022-07-15 ENCOUNTER — Telehealth: Payer: Self-pay

## 2022-07-15 NOTE — Telephone Encounter (Signed)
Call to pt reference PREP.  Requested call back to discuss.

## 2022-07-18 ENCOUNTER — Telehealth: Payer: Self-pay | Admitting: Family Medicine

## 2022-07-18 NOTE — Telephone Encounter (Signed)
Caller Name: Julanne Call back phone #: [phone]  Reason for Call: Pt is upset about not getting the medication that was prescribed on 4/23 for her feet. She did not know the name of this med. Please text her when it is sent.

## 2022-07-21 NOTE — Telephone Encounter (Signed)
Pt called back and stated that her question may have been misunderstood, She wants to know if there a prescription strength cream better than Lotrimin cream because it does not work. If so please call her back and let her know.

## 2022-07-22 NOTE — Telephone Encounter (Signed)
Called and left Jenny Wright's instruction on voice mail

## 2022-07-28 DIAGNOSIS — B351 Tinea unguium: Secondary | ICD-10-CM | POA: Diagnosis not present

## 2022-07-28 DIAGNOSIS — B353 Tinea pedis: Secondary | ICD-10-CM | POA: Diagnosis not present

## 2022-07-28 DIAGNOSIS — M79671 Pain in right foot: Secondary | ICD-10-CM | POA: Diagnosis not present

## 2022-07-28 DIAGNOSIS — M79672 Pain in left foot: Secondary | ICD-10-CM | POA: Diagnosis not present

## 2022-08-31 DIAGNOSIS — M79671 Pain in right foot: Secondary | ICD-10-CM | POA: Diagnosis not present

## 2022-08-31 DIAGNOSIS — M79672 Pain in left foot: Secondary | ICD-10-CM | POA: Diagnosis not present

## 2022-08-31 DIAGNOSIS — B353 Tinea pedis: Secondary | ICD-10-CM | POA: Diagnosis not present

## 2022-10-18 ENCOUNTER — Encounter (HOSPITAL_BASED_OUTPATIENT_CLINIC_OR_DEPARTMENT_OTHER): Payer: Self-pay | Admitting: Internal Medicine

## 2022-10-18 ENCOUNTER — Ambulatory Visit (HOSPITAL_BASED_OUTPATIENT_CLINIC_OR_DEPARTMENT_OTHER): Payer: BC Managed Care – PPO | Admitting: Internal Medicine

## 2022-10-18 VITALS — BP 118/71 | HR 59 | Ht 66.0 in | Wt 156.0 lb

## 2022-10-18 DIAGNOSIS — R7303 Prediabetes: Secondary | ICD-10-CM | POA: Diagnosis not present

## 2022-10-18 DIAGNOSIS — E7800 Pure hypercholesterolemia, unspecified: Secondary | ICD-10-CM | POA: Diagnosis not present

## 2022-10-18 DIAGNOSIS — E785 Hyperlipidemia, unspecified: Secondary | ICD-10-CM

## 2022-10-18 NOTE — Patient Instructions (Signed)
Medication Instructions:  NO CHANGES  *If you need a refill on your cardiac medications before your next appointment, please call your pharmacy*   Lab Work: LPa today -- 3rd floor  If you have labs (blood work) drawn today and your tests are completely normal, you will receive your results only by: MyChart Message (if you have MyChart) OR A paper copy in the mail If you have any lab test that is abnormal or we need to change your treatment, we will call you to review the results.    Follow-Up: At [facility], you and your health needs are our priority.  As part of our continuing mission to provide you with exceptional heart care, we have created designated Provider Care Teams.  These Care Teams include your primary Cardiologist (physician) and Advanced Practice Providers (APPs -  Physician Assistants and Nurse Practitioners) who all work together to provide you with the care you need, when you need it.  We recommend signing up for the patient portal called "MyChart".  Sign up information is provided on this After Visit Summary.  MyChart is used to connect with patients for Virtual Visits (Telemedicine).  Patients are able to view lab/test results, encounter notes, upcoming appointments, etc.  Non-urgent messages can be sent to your provider as well.   To learn more about what you can do with MyChart, go to [email or web address].    Your next appointment:    AS NEEDED with Dr. Rennis Golden  Follow up with Dr. Duke Salvia

## 2022-10-18 NOTE — Progress Notes (Signed)
LIPID CLINIC CONSULT NOTE  Chief Complaint:  Manage dyslipidemia  Primary Care Physician: Jenny Sax, Wright  Primary Cardiologist:  Jenny Wright (Inactive)  HPI:  Jenny Wright is a 57 y.o. female who is being seen today for the evaluation of dyslipidemia at the request of Jenny Records, Wright. this is a pleasant 57 year old female, referred for evaluation management of dyslipidemia.  She was initially referred by Dr. Katrinka Wright prior to his retirement and subsequently has been seen by Jenny Wright and recently Jenny Wright.  She underwent coronary calcium scoring which was 0, but has had elevated cholesterol.  She wanted to see if she could manage this with diet and exercise.  She had lipid testing in April of this year.  Her NMR lipid profile showed a particle number of 1007 and 61, LDL-C of 147, HDL-C was 71 and triglycerides were 79, her small LDL particle number was low at 523 with an LDL size of 21.3 nm.  Although her cholesterol numbers are elevated, she does have a high HDL cholesterol with no evidence of any coronary artery calcification she may be cardioprotective.  There is heart disease in the family including her father.  She was planning with diet and lifestyle modifications however was not able to do a lot of that because she said of issues with scoliosis and back pain.  Repeat labs were ordered by Jenny Wright however she did not have those drawn prior to this appointment.  This included an LP(a) assessment which I am interested in seeing.  PMHx:  Past Medical History:  Diagnosis Date   Acute bilateral low back pain without sciatica 10/01/2020   Anemia    Antiphospholipid antibody positive 02/13/2012   Arthralgia of right temporomandibular joint 01/03/2017   Bell's palsy    Bilateral impacted cerumen 09/29/2015   BPPV (benign paroxysmal positional vertigo) 09/29/2015   DDD (degenerative disc disease), lumbar 10/07/2014   Factor V Leiden mutation  complicating pregnancy (HCC)    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 Last Assessment & Plan:  Discussed possible hematology consult in future for more definitive dx.   Throat irritation 03/19/2019   Tinea pedis 08/29/2012   Uterine fibroid 02/13/2012   Uterine leiomyoma 02/13/2012    Past Surgical History:  Procedure Laterality Date   BACK SURGERY     BREAST BIOPSY Right 05/12/2022   MM RT BREAST BX W LOC DEV 1ST LESION IMAGE BX SPEC STEREO GUIDE 05/12/2022 [facility]   CESAREAN SECTION     harrington rod  1980    FAMHx:  Family History  Problem Relation Age of Onset   Breast cancer Mother        deceased   Heart disease Father        deceased   Lung cancer Brother        deceased    SOCHx:   reports that she has never smoked. She has never used smokeless tobacco.  She reports that she does not drink alcohol and does not use drugs.  ALLERGIES:  Allergies  Allergen Reactions   Penicillins Anaphylaxis, Shortness Of Breath and Swelling    SOB     Hydrochlorothiazide     Possible intolerance (numbness, tingling)   Methylprednisolone Other (See Comments)    shakes   Omeprazole Palpitations    ROS: Pertinent items noted in HPI and remainder of comprehensive ROS otherwise negative.  HOME MEDS: Current Outpatient Medications on File Prior to Visit  Medication Sig Dispense Refill   ACCU-CHEK GUIDE test strip USE AS DIRECTED EVERY  OTHER DAY TO CHECK FASTING MORNING BLOOD SUGARS 50 strip 0   b complex vitamins capsule Take 1 capsule by mouth daily.     blood glucose meter kit and supplies KIT Dispense based on patient and insurance preference.  Please check fasting morning blood sugars every other day. 1 each 0   cetirizine (ZYRTEC) 10 MG tablet Take 1 tablet (10 mg total) by mouth daily. 30 tablet 11   Cholecalciferol (VITAMIN D) 50 MCG (2000 UT) tablet Take 2,000 Units by mouth daily.     CVS VITAMIN C 1000 MG tablet Take 2,000 mg by mouth daily.     diclofenac Sodium (VOLTAREN) 1 % GEL 2 gram qid prn 100 g 3   ferrous gluconate (FERGON) 324 MG tablet Take 2 tablets by mouth daily with breakfast.     fluticasone (FLONASE) 50 MCG/ACT nasal spray Place 2 sprays into both nostrils daily. 16 g 6   Multiple Vitamin (MULTI-VITAMIN) tablet Take 1 tablet by mouth daily.     zinc gluconate 50 MG tablet Take 50 mg by mouth daily.     No current facility-administered medications on file prior to visit.    LABS/IMAGING: No results found for this or any previous visit (from the past 48 hour(s)). No results found.  LIPID PANEL:    Component Value Date/Time   CHOL 253 (H) 02/22/2022 0839   TRIG 75.0 02/22/2022 0839   HDL 67.80 02/22/2022 0839   CHOLHDL 4 02/22/2022 0839   VLDL 15.0 02/22/2022 0839   LDLCALC 170 (H) 02/22/2022 0839   LDLDIRECT 120.0 06/10/2021 0755    WEIGHTS: Wt Readings from Last 3 Encounters:  10/18/22 156 lb (70.8 kg)  07/12/22 152 lb 9.6 oz (69.2 kg)  07/01/22 156 lb (70.8 kg)    VITALS: BP 118/71 (BP Location: Left Arm, Patient Position: Sitting, Cuff Size: Normal)   Pulse (!) 59   Ht 5\' 6"  (1.676 m)   Wt 156 lb (70.8 kg)   LMP 06/20/2017   SpO2 100%   BMI 25.18 kg/m   EXAM: Deferred  EKG: Deferred  ASSESSMENT: Mixed dyslipidemia, goal LDL less than 100 0 CAC score Family history of first-degree relative with coronary artery disease  PLAN: 1.   [name] has had a mixed  dyslipidemia but has made some progress with diet and lifestyle modifications.  I would like to check an LP(a) if we can as this would impact any potential therapy options.  Currently she is on no treatments.  She would like to continue to work with diet and lifestyle and has follow-up with Jenny Wright in November.  Overall although her cholesterol is elevated, she has a high HDL cholesterol and a low small LDL particle number suggesting low risk profile.  This might explain 0 coronary calcifications.  I would generally target her to goal LDL less than 100 given family history is a risk factor.  She may be able to achieve this with diet and exercise particularly if her LP(a) is negative.  If the LP(a) is elevated, would suggest statin therapy to try to treat target.  Follow-up based on her lab work.  Thanks for the kind referral.  Chrystie Nose, Wright, [facility]  [place]  [facility]  Medical Director of the Advanced Lipid Disorders &  Cardiovascular Risk Reduction Clinic Diplomate of the American Board of Clinical Lipidology Attending Cardiologist  Direct Dial: [phone]  Fax: [phone]  Website:  [email or web address] Dontreal Miera 10/18/2022, 1:03 PM

## 2022-10-25 ENCOUNTER — Encounter: Payer: Self-pay | Admitting: Internal Medicine

## 2022-11-09 ENCOUNTER — Encounter: Payer: Self-pay | Admitting: Internal Medicine

## 2022-11-14 DIAGNOSIS — L259 Unspecified contact dermatitis, unspecified cause: Secondary | ICD-10-CM | POA: Diagnosis not present

## 2022-11-17 DIAGNOSIS — L230 Allergic contact dermatitis due to metals: Secondary | ICD-10-CM | POA: Diagnosis not present

## 2022-11-17 DIAGNOSIS — L2389 Allergic contact dermatitis due to other agents: Secondary | ICD-10-CM | POA: Diagnosis not present

## 2022-11-17 DIAGNOSIS — L233 Allergic contact dermatitis due to drugs in contact with skin: Secondary | ICD-10-CM | POA: Diagnosis not present

## 2022-11-30 DIAGNOSIS — K5900 Constipation, unspecified: Secondary | ICD-10-CM | POA: Diagnosis not present

## 2022-11-30 DIAGNOSIS — R195 Other fecal abnormalities: Secondary | ICD-10-CM | POA: Diagnosis not present

## 2022-12-12 DIAGNOSIS — M79671 Pain in right foot: Secondary | ICD-10-CM | POA: Diagnosis not present

## 2022-12-12 DIAGNOSIS — B351 Tinea unguium: Secondary | ICD-10-CM | POA: Diagnosis not present

## 2022-12-12 DIAGNOSIS — M79672 Pain in left foot: Secondary | ICD-10-CM | POA: Diagnosis not present

## 2022-12-31 IMAGING — DX DG FOOT COMPLETE 3+V*L*
3 series · 3 of 3 positions shown · non-contrast
Comparison: None.

CLINICAL DATA: Injury, dropped case of water on top of foot.

EXAM:
LEFT FOOT - COMPLETE 3+ VIEW

[foot ap]
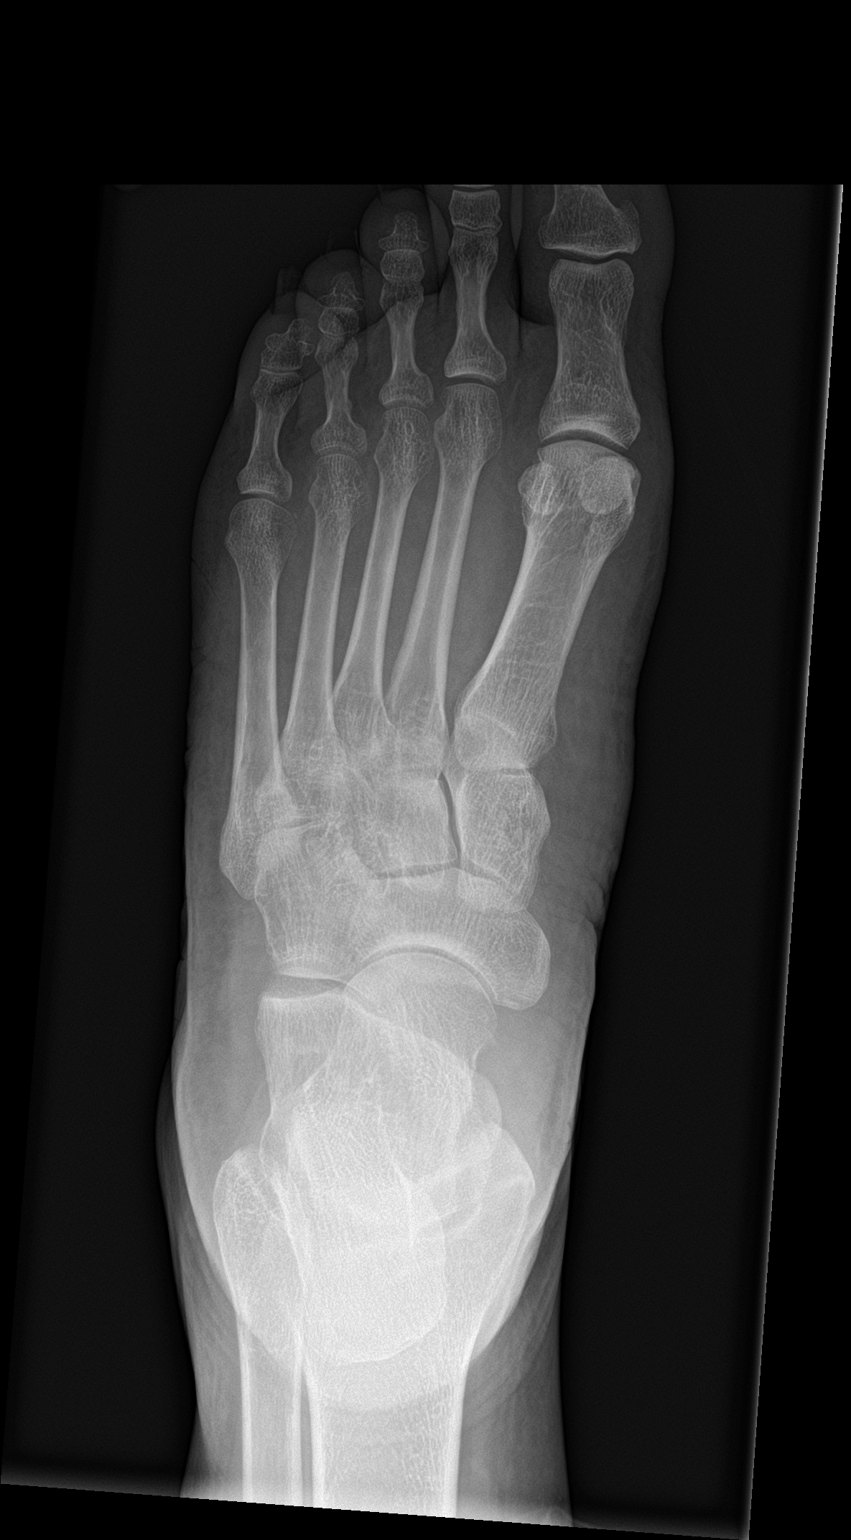

[foot obl]
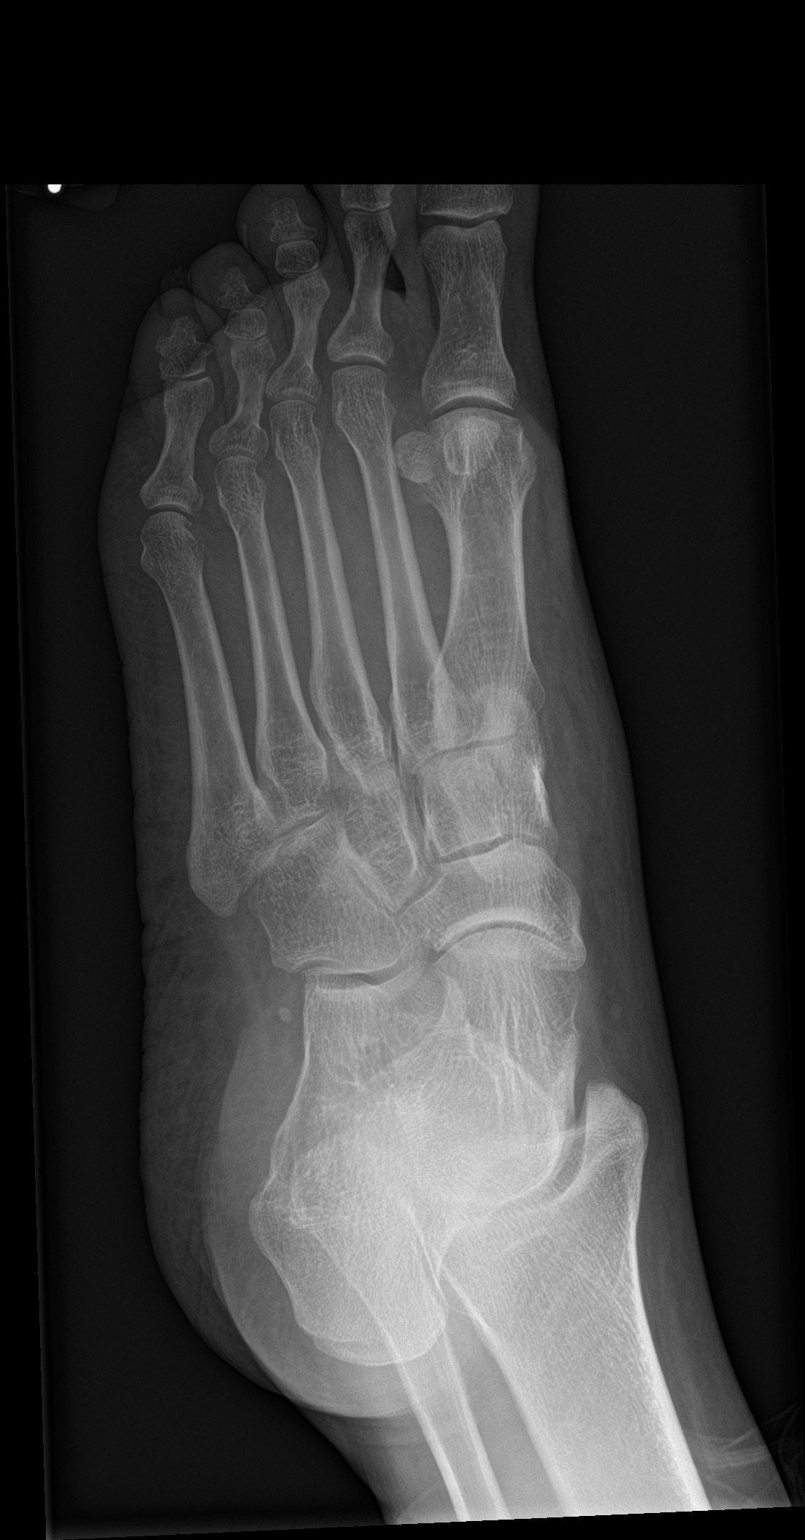

[foot lat]
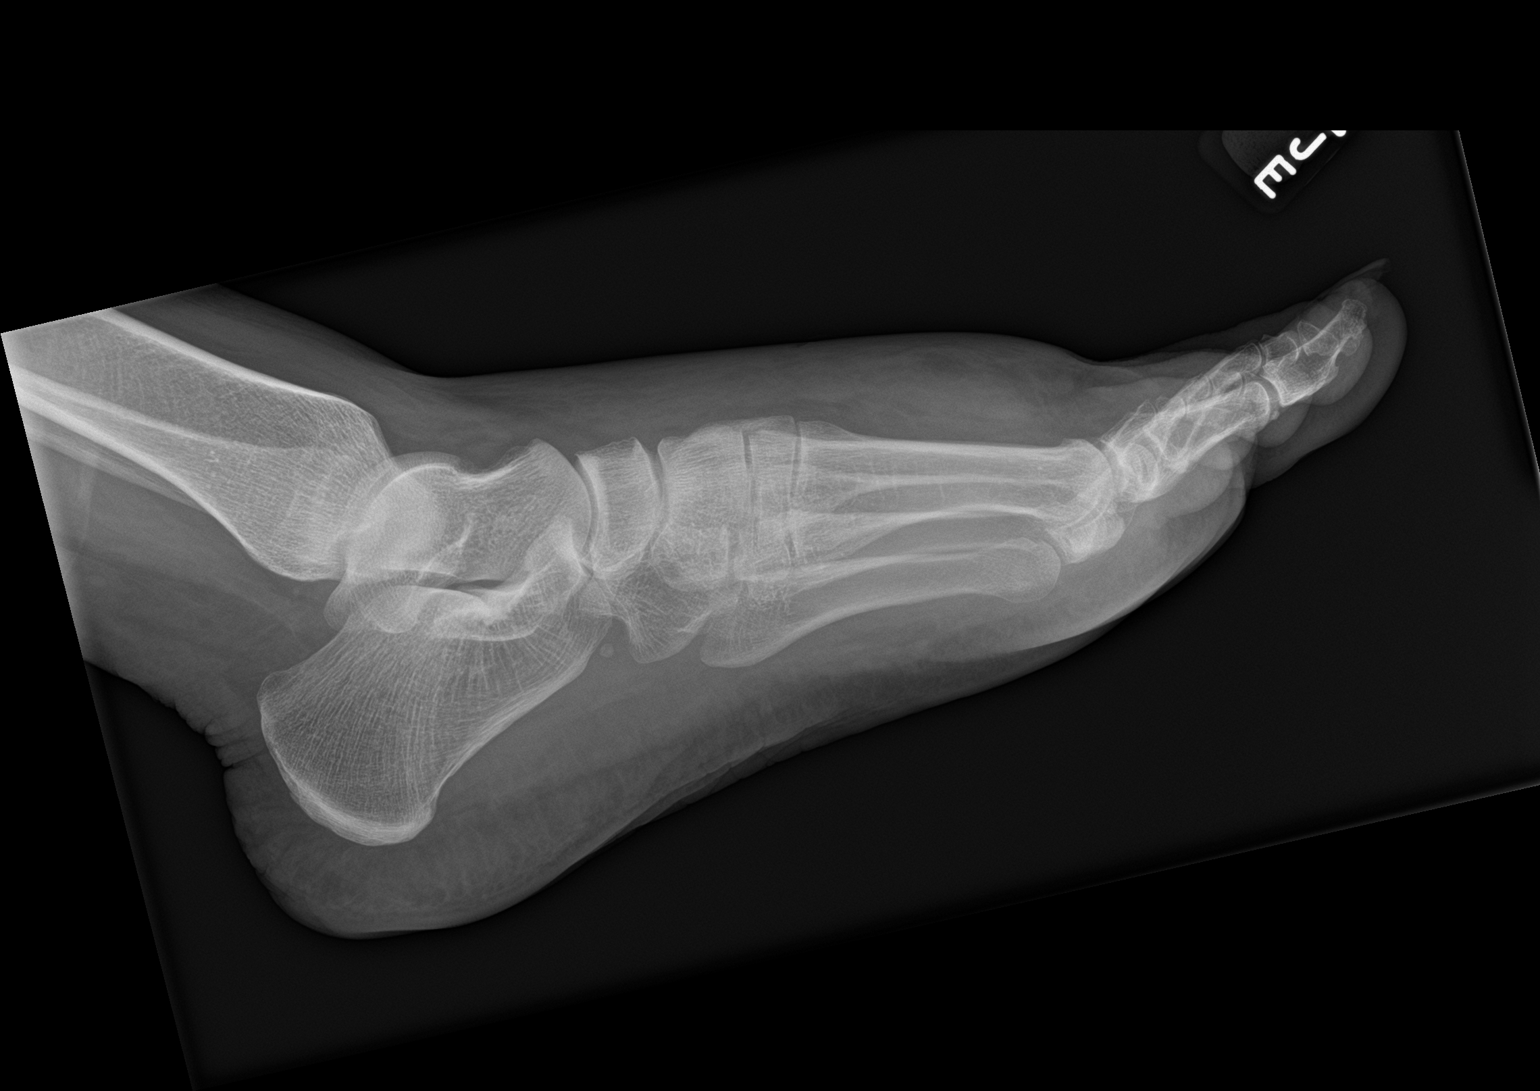

[3 of 3 positions shown; findings below may reference images not displayed]

FINDINGS: There is soft tissue swelling over the dorsum of the foot. There is
no acute fracture or dislocation identified. Joint spaces are
maintained.
IMPRESSION: 1. No acute fracture or dislocation.
2. Soft tissue swelling over the dorsum of the foot.

## 2023-01-31 DIAGNOSIS — K219 Gastro-esophageal reflux disease without esophagitis: Secondary | ICD-10-CM | POA: Diagnosis not present

## 2023-01-31 DIAGNOSIS — K5909 Other constipation: Secondary | ICD-10-CM | POA: Diagnosis not present

## 2023-02-01 IMAGING — DX DG CHEST 1V PORT
1 series · 1 of 1 positions shown · non-contrast
Comparison: Chest CT 11/13/2020, portable chest 08/23/2020

CLINICAL DATA: Chest discomfort after eating.

EXAM:
PORTABLE CHEST 1 VIEW

[chest]
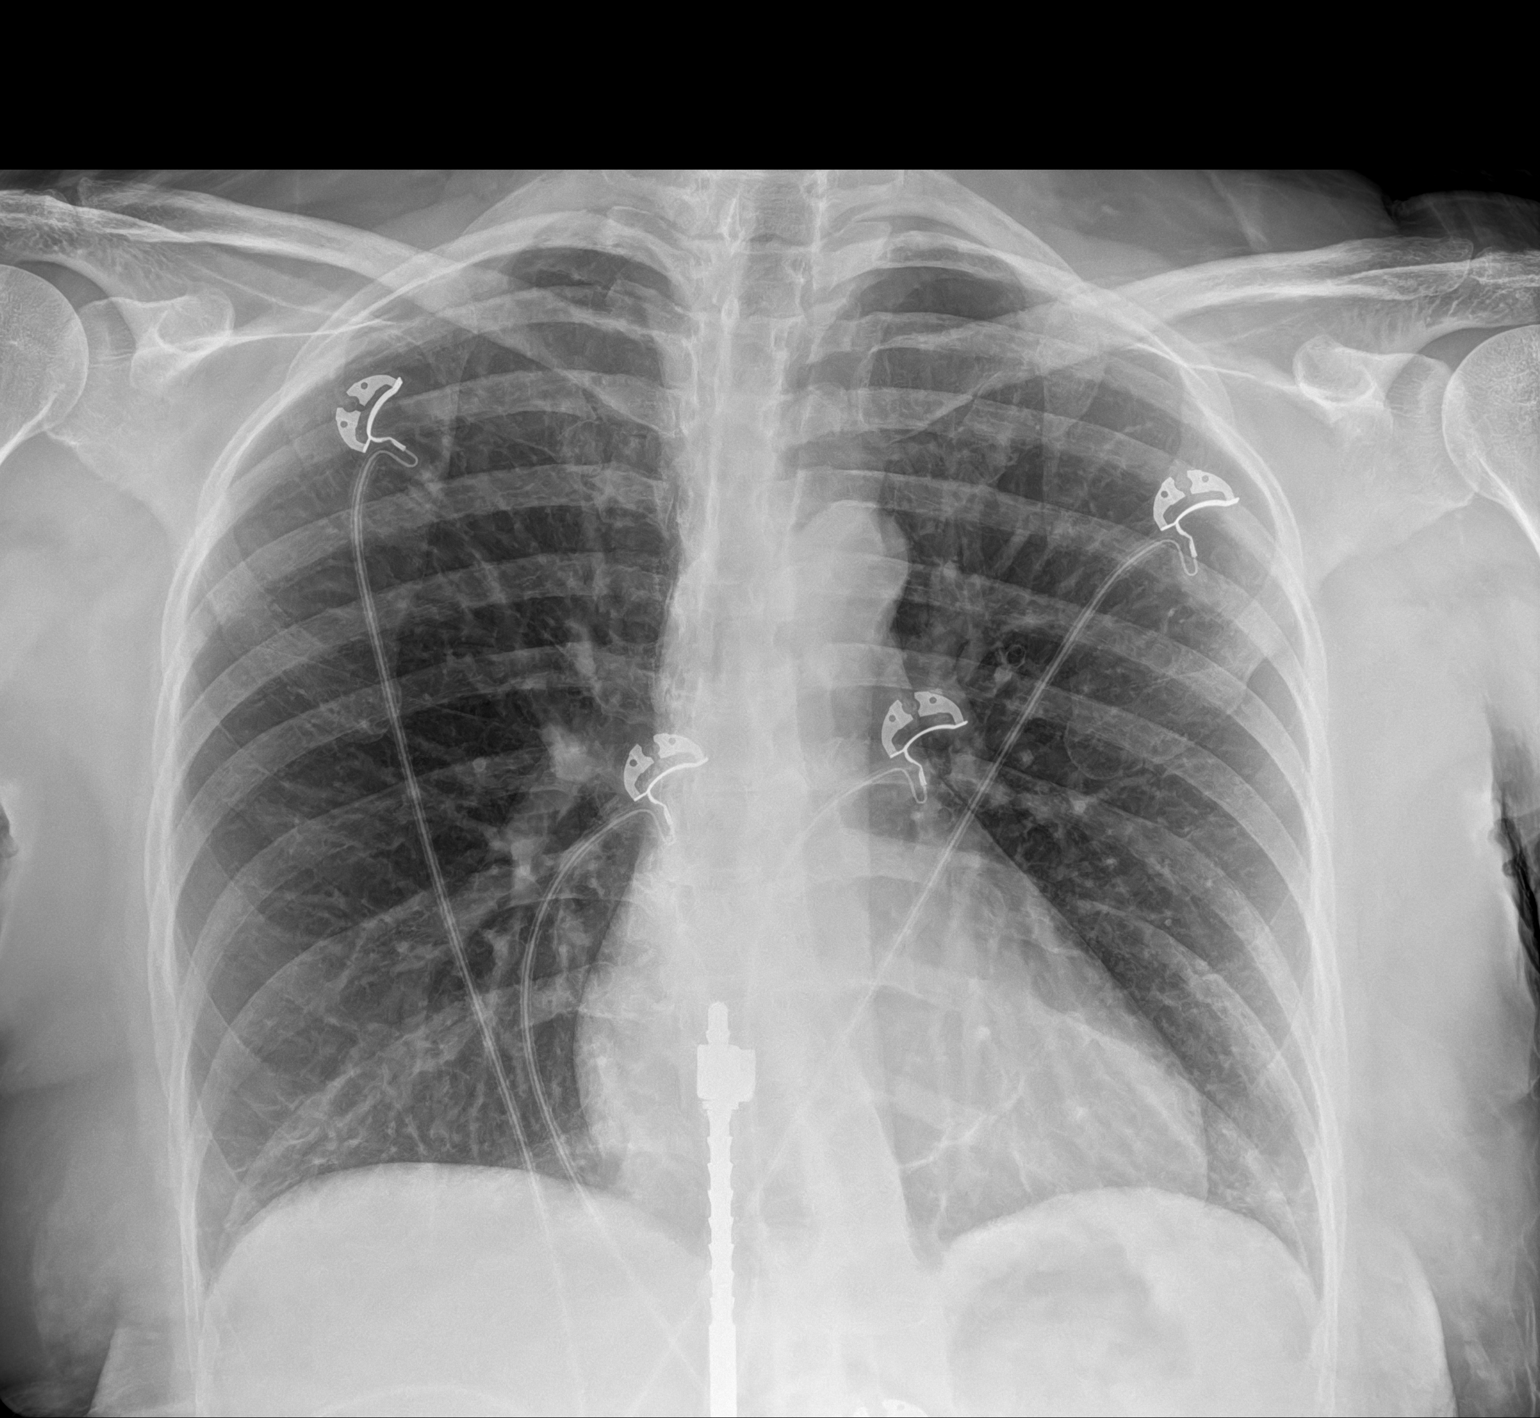

[1 of 1 positions shown; findings below may reference images not displayed]

FINDINGS: The heart size and mediastinal contours are within normal limits.
Both lungs are clear apart from chronic left lower lobe linear
scarring. The visualized skeletal structures are intact with lower
thoracic / lumbar levoscoliosis and a unilateral right-sided old
spinal fusion rod in the lower thoracic spine extending inferiorly.
IMPRESSION: No evidence of acute chest disease or interval changes.

## 2023-02-13 ENCOUNTER — Ambulatory Visit (HOSPITAL_BASED_OUTPATIENT_CLINIC_OR_DEPARTMENT_OTHER): Payer: BC Managed Care – PPO | Admitting: Cardiovascular Disease

## 2023-02-13 ENCOUNTER — Encounter (HOSPITAL_BASED_OUTPATIENT_CLINIC_OR_DEPARTMENT_OTHER): Payer: Self-pay | Admitting: Cardiovascular Disease

## 2023-02-13 VITALS — BP 122/80 | HR 59 | Ht 67.0 in | Wt 163.9 lb

## 2023-02-13 DIAGNOSIS — I10 Essential (primary) hypertension: Secondary | ICD-10-CM | POA: Diagnosis not present

## 2023-02-13 DIAGNOSIS — E7800 Pure hypercholesterolemia, unspecified: Secondary | ICD-10-CM

## 2023-02-13 NOTE — Patient Instructions (Signed)
Medication Instructions:  Your physician recommends that you continue on your current medications as directed. Please refer to the Current Medication list given to you today.  *If you need a refill on your cardiac medications before your next appointment, please call your pharmacy*  Lab Work: NONE  Testing/Procedures: NONE  Follow-Up: At [facility], you and your health needs are our priority.  As part of our continuing mission to provide you with exceptional heart care, we have created designated Provider Care Teams.  These Care Teams include your primary Cardiologist (physician) and Advanced Practice Providers (APPs -  Physician Assistants and Nurse Practitioners) who all work together to provide you with the care you need, when you need it.  We recommend signing up for the patient portal called "MyChart".  Sign up information is provided on this After Visit Summary.  MyChart is used to connect with patients for Virtual Visits (Telemedicine).  Patients are able to view lab/test results, encounter notes, upcoming appointments, etc.  Non-urgent messages can be sent to your provider as well.   To learn more about what you can do with MyChart, go to [email or web address].    Your next appointment:   24 month(s)  The format for your next appointment:   In Person  Provider:   Dr Duke Salvia or Ronn Melena NP

## 2023-02-13 NOTE — Progress Notes (Signed)
Cardiology Office Note:  .   Date:  02/13/2023  ID:  Illene Labrador, DOB March 09, 1966, MRN [id number] PCP: Mliss Sax, MD  [facility] Providers Cardiologist:  Lesleigh Noe, MD (Inactive)    History of Present Illness: .    Jenny Wright is a 57 y.o. female with Factor V Leiden, pre-eclampsioa, SLE, R ICA aneurysm, and hyperlipidemia who presents for follow up.  She was previously a patient of Dr. Katrinka Blazing.  She has been treated with HCTZ for hypertension but subsequently discontinued 02/2022 due to controlled BP.  She was seen in the ED in 2020 with chest pain.  She also had an echo at [facility] 08/2020 that revealed LVEF 55 to 60%, mild LVH, and normal diastolic function.  Coronary calcium score was 0 on 10/2020.  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  She last saw Tereso Newcomer, PA-C on 03/2022 and she was feeling well.  There was some concern about her blood pressure possibly still running high at home and she was asked to check her pressure at home.  There was a discussion about statins and she wanted to work on diet and exercise.  At her 06/2022 she was working on reducing her cholesterol.  She was exercising and making dietary changes.  She was referred to the PREP at the [facility].  Blood pressures were labile but on average less than 130/80.  She saw Dr. Rennis Golden 09/2022 and they discussed targeting an LDL less than 100.  Lp(a) was quite low so the plan was to continue with diet and exercise.  Jenny Wright reports an overall improvement in health. She has been actively managing her weight, although she notes a recent increase. She has been engaging in regular physical activity, specifically walking, which she believes has contributed to her improved blood pressure. She reports no recent contact with a specialist regarding a genetic-based cholesterol lab test (LP little a) she had taken.  The patient expresses concern about her  cholesterol levels, particularly her LDL levels, but notes that she has been making dietary changes to manage this. She has been limiting her intake of animal products, opting for lean meats such as chicken and fish when she does consume them. She is also exploring options for incorporating more beans and lentils into her diet.  The patient acknowledges the need for meal planning to ensure she has healthy options readily available, particularly during busy work periods. She is considering incorporating more whole grains into her diet, such as oatmeal, and is exploring healthier snack options like low-fat yogurts. She expresses a commitment to continue making healthy lifestyle choices to manage her health conditions.     ROS:  Negative.  Feeling well.   Studies Reviewed: Marland Kitchen   EKG Interpretation Date/Time:  Monday February 13 2023 08:14:43 EST Ventricular Rate:  56 PR Interval:  160 QRS Duration:  72 QT Interval:  398 QTC Calculation: 384 R Axis:   85  Text Interpretation: Sinus bradycardia Cannot rule out Septal infarct , age undetermined Since last tracing Rate slower Confirmed by Chilton Si ([id number]) on 02/13/2023 8:54:41 AM   Calcium score 0 10/2020.  EKG: Sinus bradycardia.  CRO prior septal infarct.  Risk Assessment/Calculations:             Physical Exam:   VS:  BP 122/80 (BP Location: Left Arm, Patient Position: Sitting, Cuff Size: Normal)   Pulse (!) 59   Ht 5'  7" (1.702 m)   Wt 163 lb 14.4 oz (74.3 kg)   LMP 06/20/2017   SpO2 98%   BMI 25.67 kg/m    Wt Readings from Last 3 Encounters:  02/13/23 163 lb 14.4 oz (74.3 kg)  10/18/22 156 lb (70.8 kg)  07/12/22 152 lb 9.6 oz (69.2 kg)    GEN: Well nourished, well developed in no acute distress NECK: No JVD; No carotid bruits CARDIAC: RRR, no murmurs, rubs, gallops RESPIRATORY:  Clear to auscultation without rales, wheezing or rhonchi  ABDOMEN: Soft, non-tender, non-distended EXTREMITIES:  No edema; No deformity    ASSESSMENT AND PLAN: .    # Hyperlipidemia Elevated LDL cholesterol, but overall low cardiovascular risk based on reassuring calcium score, high HDL, and low LP(a). Discussed the importance of diet and exercise in managing cholesterol levels. -Continue diet and exercise as primary management strategy. -Repeat cholesterol levels at annual physical in December 2024. -Repeat calcium score in 2027 to monitor for plaque development.  # Hypertension Well controlled without medication. -Continue monitoring blood pressure regularly.  # Weight Management Patient reports weight gain. -Encourage continuation of exercise regimen and healthy diet. -Consider meal planning to ensure availability of healthy options.  # General Health Maintenance -Encourage participation in cooking classes for healthy meal preparation.         Dispo: f/u 2 years  Signed, Chilton Si, MD

## 2023-03-13 ENCOUNTER — Telehealth (HOSPITAL_BASED_OUTPATIENT_CLINIC_OR_DEPARTMENT_OTHER): Payer: Self-pay | Admitting: Cardiovascular Disease

## 2023-03-13 ENCOUNTER — Encounter: Payer: Self-pay | Admitting: Family Medicine

## 2023-03-13 ENCOUNTER — Ambulatory Visit: Payer: BC Managed Care – PPO | Admitting: Family Medicine

## 2023-03-13 VITALS — BP 118/72 | Temp 98.0°F | Ht 67.0 in | Wt 162.0 lb

## 2023-03-13 DIAGNOSIS — Z0000 Encounter for general adult medical examination without abnormal findings: Secondary | ICD-10-CM

## 2023-03-13 DIAGNOSIS — I779 Disorder of arteries and arterioles, unspecified: Secondary | ICD-10-CM

## 2023-03-13 DIAGNOSIS — Z131 Encounter for screening for diabetes mellitus: Secondary | ICD-10-CM

## 2023-03-13 DIAGNOSIS — J302 Other seasonal allergic rhinitis: Secondary | ICD-10-CM | POA: Diagnosis not present

## 2023-03-13 DIAGNOSIS — E785 Hyperlipidemia, unspecified: Secondary | ICD-10-CM

## 2023-03-13 DIAGNOSIS — H6993 Unspecified Eustachian tube disorder, bilateral: Secondary | ICD-10-CM

## 2023-03-13 LAB — CBC WITH DIFFERENTIAL/PLATELET
Basophils Absolute: 0 10*3/uL (ref 0.0–0.1)
Basophils Relative: 0.6 % (ref 0.0–3.0)
Eosinophils Absolute: 0 10*3/uL (ref 0.0–0.7)
Eosinophils Relative: 1.1 % (ref 0.0–5.0)
HCT: 37.9 % (ref 36.0–46.0)
Hemoglobin: 12.8 g/dL (ref 12.0–15.0)
Lymphocytes Relative: 45.5 % (ref 12.0–46.0)
Lymphs Abs: 1.5 10*3/uL (ref 0.7–4.0)
MCHC: 33.7 g/dL (ref 30.0–36.0)
MCV: 89 fL (ref 78.0–100.0)
Monocytes Absolute: 0.3 10*3/uL (ref 0.1–1.0)
Monocytes Relative: 9.2 % (ref 3.0–12.0)
Neutro Abs: 1.5 10*3/uL (ref 1.4–7.7)
Neutrophils Relative %: 43.6 % (ref 43.0–77.0)
Platelets: 202 10*3/uL (ref 150.0–400.0)
RBC: 4.26 Mil/uL (ref 3.87–5.11)
RDW: 13.2 % (ref 11.5–15.5)
WBC: 3.4 10*3/uL — ABNORMAL LOW (ref 4.0–10.5)

## 2023-03-13 LAB — COMPREHENSIVE METABOLIC PANEL
ALT: 17 U/L (ref 0–35)
AST: 19 U/L (ref 0–37)
Albumin: 4.2 g/dL (ref 3.5–5.2)
Alkaline Phosphatase: 68 U/L (ref 39–117)
BUN: 15 mg/dL (ref 6–23)
CO2: 29 meq/L (ref 19–32)
Calcium: 9.2 mg/dL (ref 8.4–10.5)
Chloride: 103 meq/L (ref 96–112)
Creatinine, Ser: 0.8 mg/dL (ref 0.40–1.20)
GFR: 81.77 mL/min (ref >60.00)
Glucose, Bld: 104 mg/dL — ABNORMAL HIGH (ref 70–99)
Potassium: 3.8 meq/L (ref 3.5–5.1)
Sodium: 140 meq/L (ref 135–145)
Total Bilirubin: 0.5 mg/dL (ref 0.2–1.2)
Total Protein: 6.9 g/dL (ref 6.0–8.3)

## 2023-03-13 LAB — HEMOGLOBIN A1C: Hgb A1c MFr Bld: 6.2 % (ref 4.6–6.5)

## 2023-03-13 LAB — LIPID PANEL
Cholesterol: 190 mg/dL (ref 0–200)
HDL: 54.3 mg/dL (ref >39.00)
LDL Cholesterol: 122 mg/dL — ABNORMAL HIGH (ref 0–99)
NonHDL: 135.72
Total CHOL/HDL Ratio: 3
Triglycerides: 69 mg/dL (ref 0.0–149.0)
VLDL: 13.8 mg/dL (ref 0.0–40.0)

## 2023-03-13 MED ORDER — CETIRIZINE HCL 10 MG PO TABS: 10.0000 mg | ORAL_TABLET | Freq: Every day | ORAL | 11 refills | Status: AC

## 2023-03-13 NOTE — Telephone Encounter (Signed)
CT angio neck 02/2022 in setting of ED visit for headache with mild narrowing <50% in right vertebral artery. Vertebral arteries go up the back of the neck along the spinal column. These are not heart arteries, is likely why not addressed in prior clinic visits.   The general guidance for vertebral artery stenosis is statin (even low dose) to reduce cholesterol and prevent progression of stenosis. Other things that help prevent progression include a healthy lifestyle, blood pressure control to goal <130/80.   Per Dr. Rennis Golden prior note, given calcium score of 0 and negative Lp(a). LDL today 122 (previously 098) showing improvement. LDL not yet at goal <100. Would be reasonable to start low dose statin (I.e., Rosuvastatin 10mg ) even if she wanted to start at 3x per week. Can also schedule OV with lipid clinic (MD or PharmD) or Dr. Duke Salvia to discuss.   Jenny Sorrow, NP

## 2023-03-13 NOTE — Telephone Encounter (Signed)
Patient returned RN's call regarding results. 

## 2023-03-13 NOTE — Telephone Encounter (Signed)
  The patient said she saw her PCP today and was told that she has a blockage and that they wanted to prescribe her a statin. The patient said this is brand new information to her, and she wasn't informed about the blockage by Dr. Duke Salvia or Dr. Rennis Golden. She would like to know if her heart doctor missed telling her about the blockage or if her PCP is just trying to prescribe her medication. She would like to receive a call from the nurse today to discuss it.

## 2023-03-13 NOTE — Telephone Encounter (Signed)
Tried to call patient, mail box is full.

## 2023-03-13 NOTE — Telephone Encounter (Signed)
Unable to reach leave vm via phone  Mychart message sent to patient and will discuss with Ronn Melena NP

## 2023-03-13 NOTE — Progress Notes (Signed)
Established Patient Office Visit   Subjective:  Patient ID: Jenny Wright, female    DOB: May 03, 1965  Age: 57 y.o. MRN: [id number]  Chief Complaint  Patient presents with   Annual Exam    CPE. Pt is fasting.     HPI Encounter Diagnoses  Name Primary?   Healthcare maintenance Yes   Seasonal allergic rhinitis, unspecified trigger    Dysfunction of both eustachian tubes    Screening for diabetes mellitus    Vertebral artery disease (HCC)    Here for physical and follow-up of above.  She is fasting this morning.  She lives at home with her 52 year old son.  She exercises year-round on a regular basis.  She does have regular dental care.  Health maintenance is up-to-date.  She has a history of vertebral artery disease with a 3 mm aneurysm in her ICA.  She has follow-up scheduled with neurology in January.   Review of Systems  Constitutional: Negative.   HENT: Negative.    Eyes:  Negative for blurred vision, discharge and redness.  Respiratory: Negative.    Cardiovascular: Negative.   Gastrointestinal:  Negative for abdominal pain.  Genitourinary: Negative.   Musculoskeletal: Negative.  Negative for myalgias.  Skin:  Negative for rash.  Neurological:  Negative for tingling, loss of consciousness and weakness.  Endo/Heme/Allergies:  Negative for polydipsia.      03/13/2023    8:54 AM 03/25/2022    3:43 PM 03/25/2022    3:09 PM  Depression screen PHQ 2/9  Decreased Interest 0 0 0  Down, Depressed, Hopeless 0 1 1  PHQ - 2 Score 0 1 1  Altered sleeping 0 1   Tired, decreased energy 0 0   Change in appetite 0 0   Feeling bad or failure about yourself  0 0   Trouble concentrating 0 0   Moving slowly or fidgety/restless 0 0   Suicidal thoughts 0 0   PHQ-9 Score 0 2   Difficult doing work/chores Not difficult at all Not difficult at all        Current Outpatient Medications:    ACCU-CHEK GUIDE test strip, USE AS DIRECTED EVERY OTHER DAY TO CHECK FASTING MORNING BLOOD  SUGARS, Disp: 50 strip, Rfl: 0   b complex vitamins capsule, Take 1 capsule by mouth daily., Disp: , Rfl:    blood glucose meter kit and supplies KIT, Dispense based on patient and insurance preference.  Please check fasting morning blood sugars every other day., Disp: 1 each, Rfl: 0   Cholecalciferol (VITAMIN D) 50 MCG (2000 UT) tablet, Take 2,000 Units by mouth daily., Disp: , Rfl:    CVS VITAMIN C 1000 MG tablet, Take 2,000 mg by mouth daily., Disp: , Rfl:    diclofenac Sodium (VOLTAREN) 1 % GEL, 2 gram qid prn, Disp: 100 g, Rfl: 3   ferrous gluconate (FERGON) 324 MG tablet, Take 2 tablets by mouth daily with breakfast., Disp: , Rfl:    Multiple Vitamin (MULTI-VITAMIN) tablet, Take 1 tablet by mouth daily., Disp: , Rfl:    zinc gluconate 50 MG tablet, Take 50 mg by mouth daily., Disp: , Rfl:    cetirizine (ZYRTEC) 10 MG tablet, Take 1 tablet (10 mg total) by mouth daily., Disp: 30 tablet, Rfl: 11   Objective:     BP 118/72   Temp 98 F (36.7 C)   Ht 5\' 7"  (1.702 m)   Wt 162 lb (73.5 kg)   LMP 06/20/2017   SpO2 98%  BMI 25.37 kg/m    Physical Exam Constitutional:      General: She is not in acute distress.    Appearance: Normal appearance. She is not ill-appearing, toxic-appearing or diaphoretic.  HENT:     Head: Normocephalic and atraumatic.     Right Ear: Tympanic membrane, ear canal and external ear normal.     Left Ear: Tympanic membrane, ear canal and external ear normal.     Mouth/Throat:     Mouth: Mucous membranes are moist.     Pharynx: Oropharynx is clear. No oropharyngeal exudate or posterior oropharyngeal erythema.  Eyes:     General: No scleral icterus.       Right eye: No discharge.        Left eye: No discharge.     Extraocular Movements: Extraocular movements intact.     Conjunctiva/sclera: Conjunctivae normal.     Pupils: Pupils are equal, round, and reactive to light.  Cardiovascular:     Rate and Rhythm: Normal rate and regular rhythm.  Pulmonary:      Effort: Pulmonary effort is normal. No respiratory distress.     Breath sounds: Normal breath sounds.  Abdominal:     General: Bowel sounds are normal.  Musculoskeletal:     Cervical back: No rigidity or tenderness.     Right lower leg: No edema.     Left lower leg: No edema.  Skin:    General: Skin is warm and dry.  Neurological:     Mental Status: She is alert and oriented to person, place, and time.  Psychiatric:        Mood and Affect: Mood normal.        Behavior: Behavior normal.      No results found for any visits on 03/13/23.    The 10-year ASCVD risk score (Arnett DK, et al., 2019) is: 2.9%    Assessment & Plan:   Healthcare maintenance -     CBC with Differential/Platelet  Seasonal allergic rhinitis, unspecified trigger -     Cetirizine HCl; Take 1 tablet (10 mg total) by mouth daily.  Dispense: 30 tablet; Refill: 11  Dysfunction of both eustachian tubes -     Cetirizine HCl; Take 1 tablet (10 mg total) by mouth daily.  Dispense: 30 tablet; Refill: 11  Screening for diabetes mellitus -     Comprehensive metabolic panel -     Hemoglobin A1c  Vertebral artery disease (HCC) -     Comprehensive metabolic panel -     Lipid panel -     Ambulatory referral to Cardiology    Return in about 6 months (around 09/11/2023).  Patient is reluctant to take a statin for her documented vertebral artery disease.  She has follow-up with neurology in January for documented 3mm aneurysm of ICA.  Discussed treating blockage with a statin.  It seemed to be a communication breakdown between [place].  Will refer her back to cardiology so they might discuss this with her .  Information was given on atorvastatin.  Mliss Sax, MD

## 2023-03-14 NOTE — Telephone Encounter (Addendum)
Called, no answer. Unable to leave VM as VM box full.  Alver Sorrow, NP

## 2023-03-16 NOTE — Telephone Encounter (Signed)
Tried calling patient, mailbox full Patient never read [employer]  Printed phone message information and mailed to patient

## 2023-03-21 MED ORDER — ROSUVASTATIN CALCIUM 10 MG PO TABS: 10.0000 mg | ORAL_TABLET | ORAL | 1 refills | Status: DC

## 2023-03-21 NOTE — Addendum Note (Signed)
Addended by: Gabriel Rung on: 03/21/2023 12:28 PM   Modules accepted: Orders

## 2023-03-21 NOTE — Telephone Encounter (Signed)
 Called and spoke to patient.  Patient concerned on PCP mentioning a clogged artery and miscommunication. Pt wants cardiology second opinion on what PCP mentioned.  Read the following to patient:  CT angio neck 02/2022 in setting of ED visit for headache with mild narrowing <50% in right vertebral artery. Vertebral arteries go up the back of the neck along the spinal column. These are not heart arteries, is likely why not addressed in prior clinic visits.    The general guidance for vertebral artery stenosis is statin (even low dose) to reduce cholesterol and prevent progression of stenosis. Other things that help prevent progression include a healthy lifestyle, blood pressure control to goal <130/80.    Per Dr. Mona prior note, given calcium  score of 0 and negative Lp(a). LDL today 122 (previously 829) showing improvement. LDL not yet at goal <100. Would be reasonable to start low dose statin (I.e., Rosuvastatin  10mg ) even if she wanted to start at 3x per week. Can also schedule OV with lipid clinic (MD or PharmD) or Dr. Raford to discuss.    Jenny Wright Finder, NP   Patient feels hopeless and defeated with mentioning of medications. Tried healthy lifestyles and dieting. Explained statins uses to patient and need of repeat labs to check on LDL levels and lipids. Any and all questions were answered.

## 2023-03-23 ENCOUNTER — Other Ambulatory Visit: Payer: Self-pay | Admitting: Neurosurgery

## 2023-03-23 DIAGNOSIS — I671 Cerebral aneurysm, nonruptured: Secondary | ICD-10-CM

## 2023-03-24 ENCOUNTER — Telehealth: Payer: Self-pay

## 2023-03-24 NOTE — Telephone Encounter (Signed)
 Called to verify if pt wants recent lab results mailed or placed in FO for pick up. No answer - LVM.

## 2023-03-27 ENCOUNTER — Telehealth: Payer: Self-pay | Admitting: Cardiovascular Disease

## 2023-03-27 NOTE — Telephone Encounter (Signed)
 Pt is having numbness and wants to talk to a nurse

## 2023-03-27 NOTE — Telephone Encounter (Signed)
 Left message to call back

## 2023-03-27 NOTE — Telephone Encounter (Signed)
 Recent lab results, collected 03/13/23, mailed to patient t address on file.

## 2023-03-27 NOTE — Telephone Encounter (Signed)
 Copied from CRM [id number]. Topic: General - Other >> Mar 24, 2023  2:05 PM Gerardine PARAS wrote: Reason for CRM: Patient returned call to [facility] and confirmed she would like lab results mailed to her address on file. Patient also requested a confirmation of exactly what labs were redone. Asked for call back at [phone]

## 2023-03-31 ENCOUNTER — Ambulatory Visit: Payer: Self-pay | Admitting: Family Medicine

## 2023-03-31 NOTE — Telephone Encounter (Signed)
 Copied from CRM [id number]. Topic: Clinical - Red Word Triage >> Mar 31, 2023  1:26 PM Gerardine PARAS wrote: Red Word that prompted transfer to Nurse Triage: Patient is calling in regarding experiencing tingling on left side that is returning and also numbness, states she believes it might be a circulation issue Patient was seen regarding this and referred to cardiology who prescribed cholesterol medication, possibly rosuvastatin  (CRESTOR ) 10 MG tablet and since starting the medication on Monday is noticing a tightness in throat and chest that is coming and going   Chief Complaint: intermittent tightness in throat and chest  Symptoms: throat felt constricted quickly resolved did not return same day but following morning same symptom Frequency: twice Pertinent Negatives: Patient denies shortness of breath  Disposition: [] ED /[] Urgent Care (no appt availability in office) / [] Appointment(In office/virtual)/ []  [place] [facility]/ [] Home Care/ [] Refused Recommended Disposition /[] [place] [facility]/ [x]  Follow-up with PCP Additional Notes: The patient reported a tightness in her throat and chest that occurred Wednesday morning.  Her breath felt constricted bud she denied shortness of breath.  The tightness resolved and did not occur again until the following morning and resolved quickly then as well.  Her first dose of rosuvastatin  was Tuesday evening so she was concerned of a possible medication reaction.  Cardiology prescribed the medication and she consulted them about the symptoms experienced and they advised her to continue taking the medication and follow up with her pcp.  The patient was not experiencing the tightness in her throat or chest at the time of triage.  She was advised to go to the ED for further evaluation if symptoms reoccurred. Message routed to pcp for further recommendations and timeframe to be seen in office for reported symptoms.  Called [facility] and spoke to Gina to notify of high  priority message.   Reason for Disposition  [1] Caller has medicine question about med NOT prescribed by PCP AND [2] triager unable to answer question (e.g., compatibility with other med, storage)  [1] MILD difficulty breathing (e.g., minimal/no SOB at rest, SOB with walking, pulse <100) AND [2] NEW-onset or WORSE than normal  Answer Assessment - Initial Assessment Questions 1. RESPIRATORY STATUS: Describe your breathing? (e.g., wheezing, shortness of breath, unable to speak, severe coughing)      Intermittent Shortness of breath  2. ONSET: When did this breathing problem begin?      After taking rosuvastatin   3. PATTERN Does the difficult breathing come and go, or has it been constant since it started?      Intermittent  4. SEVERITY: How bad is your breathing? (e.g., mild, moderate, severe)    - MILD: No SOB at rest, mild SOB with walking, speaks normally in sentences, can lie down, no retractions, pulse < 100.    - MODERATE: SOB at rest, SOB with minimal exertion and prefers to sit, cannot lie down flat, speaks in phrases, mild retractions, audible wheezing, pulse 100-120.    - SEVERE: Very SOB at rest, speaks in single words, struggling to breathe, sitting hunched forward, retractions, pulse > 120      Breathing felt restricted  5. RECURRENT SYMPTOM: Have you had difficulty breathing before? If Yes, ask: When was the last time? and What happened that time?      New med  6. CARDIAC HISTORY: Do you have any history of heart disease? (e.g., heart attack, angina, bypass surgery, angioplasty)      Referred to cardiology due to circulation concerns  7.  LUNG HISTORY: Do you have any history of lung disease?  (e.g., pulmonary embolus, asthma, emphysema)     No  8. CAUSE: What do you think is causing the breathing problem?      Possible medication reaction; cardiology advised to continue rosuvastatin   9. OTHER SYMPTOMS: Do you have any other symptoms? (e.g., dizziness, runny  nose, cough, chest pain, fever)     Chest tightness  Protocols used: Breathing Difficulty-A-AH, Medication Question Call-A-AH

## 2023-03-31 NOTE — Telephone Encounter (Signed)
 Called patient back.   Notes random episodes of numbness. Notes she previously had similar episodes while on hydrochlorothiazide  and the medication was discontinued.  She is not on hydrochlorothiazide  at this time but notes her symptoms feel similar. When she sits or lays down for a long period of time she feels numb on the side that she is laying/sleeping on. Also notes tingling in various spots from her face to her arms. She wants to know what doctor can look at my files, xrays, scans to put the picture together. Reassured this is noncardiac in nature and initial workup generally with PCP who may consider alternate provider such as neurology.   Additionally reports concerns regarding her rosuvastatin .  She is taking 10 mg every other day as prescribed 03/31/23 and notes a very intermittent  squeezing sensation when she breathes in her throat and chest. It self resolves after a few seconds. Reviewed the safety profile of Rosuvastatin  and low suspicion it is contributory. She is agreeable to continue Rosuvastatin  10mg  every other day and discussed she could reduce to 5mg  every other day if she felt necessary. She has upcoming visit with lipid clinic.  Jenny Starzyk S Pollyanna Levay, NP

## 2023-04-05 ENCOUNTER — Telehealth: Payer: Self-pay

## 2023-04-05 NOTE — Telephone Encounter (Signed)
 Copied from CRM [id number]. Topic: Clinical - Medical Advice >> Apr 04, 2023  5:36 PM Tiffany H wrote: Reason for CRM: Patient called to request a referral for [facility] to explore the possibility that she might be suffering from long COVID. Patient is concerned that her reactivity to medications post-COVID aren't being addressed.  Patient advised that she was having some adverse reactions to Rosuvastatin , so she stopped last night. Patient advised that she feels like she's having COVID symptoms. Would like an alternative to Rosuvastatin . Advised patient to speak with cardiology about cardiology medications.  She's also concerned about findings from recent Xray. She would like to see the Xray. She would like to know the findings of the Xray. Patient acknowledges the communication issue during appointment and would like a further breakdown. She would also like to know which specialists handle what issues. She would like some help creating a big picture care plan. Please advise via phone. Please call patient to for an educational consult. Patient prefers a phone call rather than MyChart message.

## 2023-04-07 ENCOUNTER — Telehealth: Payer: Self-pay | Admitting: Family Medicine

## 2023-04-07 NOTE — Telephone Encounter (Signed)
 ERROR

## 2023-04-14 ENCOUNTER — Telehealth: Payer: Self-pay | Admitting: Family Medicine

## 2023-04-14 NOTE — Telephone Encounter (Signed)
error

## 2023-04-21 ENCOUNTER — Telehealth: Payer: Self-pay | Admitting: Radiology

## 2023-04-21 ENCOUNTER — Other Ambulatory Visit (HOSPITAL_COMMUNITY): Payer: Self-pay | Admitting: Neurosurgery

## 2023-04-21 ENCOUNTER — Ambulatory Visit
Admission: RE | Admit: 2023-04-21 | Discharge: 2023-04-21 | Disposition: A | Discharge status: Home / Self Care | Payer: BC Managed Care – PPO | Source: Ambulatory Visit | Attending: Neurosurgery

## 2023-04-21 ENCOUNTER — Telehealth: Payer: Self-pay

## 2023-04-21 DIAGNOSIS — I671 Cerebral aneurysm, nonruptured: Secondary | ICD-10-CM

## 2023-04-21 NOTE — Progress Notes (Signed)
Pt brought over to nurses station after receiving IV contrast due to IV infiltration of CT contrast to the patients right upper arm. Upon my assessment the IV catheter had already been removed and the patients right forearm appeared slightly swollen. No redness noted. The patients arm was elevated above her heart level and ice was applied. Dr. Deanne Coffer was notified and assessed the patient. The patient was able to move her right arm and all fingers without any complaints of tingling or numbness. Dr. Deanne Coffer told the patient that a PA of [place] [facility] would call and check on the patient this afternoon as well as the patient should try to keep her arm elevated as much as she could today, Ice the site throughout the day, and call with any changes. The patient was provided our direct contact information and verbalized understanding. The patient also refused to have another IV placed today for a CT scan and stated she would reschedule.

## 2023-04-21 NOTE — Telephone Encounter (Signed)
Spoke with patient about contrast extravasation earlier today during her study. Patient reports swelling, which was visualized by staff at the facility. However, she believes it has gotten worse. Patient reports the redness in the arm resolved. She was educated about when to seek care at the ER. This includes increased swelling/pain, skin changes (blisters and/or color changes), increased numbness/tingling in the arms or hands, and/or inability to move the hand. Patient was informed to keep the arm elevated at heart level and use ice for 20 minutes at a time for the next 1-2 days.

## 2023-04-21 NOTE — Telephone Encounter (Signed)
Patient notified of below message from Dr. Janee Morn.

## 2023-04-21 NOTE — Telephone Encounter (Signed)
Attempted to contact patient twice to check in about her infiltrated IV today. I was unable to reach the patient.

## 2023-04-21 NOTE — Telephone Encounter (Signed)
Copied from CRM [id number]. Topic: Clinical - Medical Advice >> Apr 21, 2023 12:08 PM [name] wrote: Reason for CRM: CT Scan  Forwarding message above. Patient is requesting CT scan results.

## 2023-04-21 NOTE — Telephone Encounter (Signed)
Copied from CRM [id number]. Topic: Clinical - Medical Advice >> Apr 21, 2023 12:08 PM [name] wrote: Reason for CRM: CT Scan >> Apr 21, 2023 12:12 PM [name] wrote: Patient's states she had a bad experience at [facility] when inputting the needle for an IV to push the dye for the CT Scan, it didn't go inside her vein and it just swelled her arm up. Patient is requesting a call back at [phone] in regards to what she need to do and or can do about this situation.

## 2023-04-21 NOTE — Telephone Encounter (Signed)
I called and spoke with patient and she did not get CT scan done after her experience at [facility]. Patient wants to know what she watching for since the medication went in her arm and not her vein. Any adverse reactions that she needs to watch for or if the swelling in her arm does not go dow, etc.   Please advise.

## 2023-04-26 DIAGNOSIS — Z01419 Encounter for gynecological examination (general) (routine) without abnormal findings: Secondary | ICD-10-CM | POA: Diagnosis not present

## 2023-04-27 ENCOUNTER — Ambulatory Visit (HOSPITAL_COMMUNITY): Admission: RE | Admit: 2023-04-27 | Payer: BC Managed Care – PPO | Source: Ambulatory Visit

## 2023-04-28 ENCOUNTER — Ambulatory Visit (HOSPITAL_COMMUNITY)
Admission: RE | Admit: 2023-04-28 | Discharge: 2023-04-28 | Disposition: A | Discharge status: Home / Self Care | Payer: BC Managed Care – PPO | Source: Ambulatory Visit | Attending: Neurosurgery | Admitting: Neurosurgery

## 2023-04-28 DIAGNOSIS — I671 Cerebral aneurysm, nonruptured: Secondary | ICD-10-CM | POA: Insufficient documentation

## 2023-04-28 MED ORDER — IOHEXOL 350 MG/ML SOLN
75.0000 mL | Freq: Once | INTRAVENOUS | Status: AC | PRN
  Administered 2023-04-28: 75 mL via INTRAVENOUS

## 2023-05-01 DIAGNOSIS — I671 Cerebral aneurysm, nonruptured: Secondary | ICD-10-CM | POA: Diagnosis not present

## 2023-05-11 ENCOUNTER — Ambulatory Visit: Payer: BC Managed Care – PPO | Admitting: Adult Health

## 2023-05-15 DIAGNOSIS — M79672 Pain in left foot: Secondary | ICD-10-CM | POA: Diagnosis not present

## 2023-05-15 DIAGNOSIS — M79671 Pain in right foot: Secondary | ICD-10-CM | POA: Diagnosis not present

## 2023-05-15 DIAGNOSIS — B351 Tinea unguium: Secondary | ICD-10-CM | POA: Diagnosis not present

## 2023-05-16 ENCOUNTER — Other Ambulatory Visit: Payer: Self-pay | Admitting: Obstetrics and Gynecology

## 2023-05-16 DIAGNOSIS — Z1231 Encounter for screening mammogram for malignant neoplasm of breast: Secondary | ICD-10-CM

## 2023-05-31 ENCOUNTER — Ambulatory Visit: Payer: BC Managed Care – PPO

## 2023-05-31 ENCOUNTER — Inpatient Hospital Stay
Admission: RE | Admit: 2023-05-31 | Discharge: 2023-05-31 | Disposition: A | Discharge status: Home / Self Care | Source: Ambulatory Visit | Attending: Family Medicine | Admitting: Family Medicine

## 2023-05-31 DIAGNOSIS — Z1231 Encounter for screening mammogram for malignant neoplasm of breast: Secondary | ICD-10-CM

## 2023-06-15 ENCOUNTER — Ambulatory Visit: Payer: BC Managed Care – PPO | Attending: Internal Medicine | Admitting: Internal Medicine

## 2023-06-15 ENCOUNTER — Encounter: Payer: Self-pay | Admitting: Internal Medicine

## 2023-06-15 VITALS — BP 120/68 | HR 60 | Ht 67.0 in | Wt 163.0 lb

## 2023-06-15 DIAGNOSIS — I6521 Occlusion and stenosis of right carotid artery: Secondary | ICD-10-CM

## 2023-06-15 DIAGNOSIS — E785 Hyperlipidemia, unspecified: Secondary | ICD-10-CM | POA: Diagnosis not present

## 2023-06-15 NOTE — Patient Instructions (Signed)
 Medication Instructions:  Your physician recommends that you continue on your current medications as directed. Please refer to the Current Medication list given to you today.    *If you need a refill on your cardiac medications before your next appointment, please call your pharmacy*   Lab Work: LIPID PANEL NMR, Lipoprofile   If you have labs (blood work) drawn today and your tests are completely normal, you will receive your results only by: MyChart Message (if you have MyChart) OR A paper copy in the mail If you have any lab test that is abnormal or we need to change your treatment, we will call you to review the results.   Testing/Procedures: None    Follow-Up: At [facility], you and your health needs are our priority.  As part of our continuing mission to provide you with exceptional heart care, we have created designated Provider Care Teams.  These Care Teams include your primary Cardiologist (physician) and Advanced Practice Providers (APPs -  Physician Assistants and Nurse Practitioners) who all work together to provide you with the care you need, when you need it.  We recommend signing up for the patient portal called "MyChart".  Sign up information is provided on this After Visit Summary.  MyChart is used to connect with patients for Virtual Visits (Telemedicine).  Patients are able to view lab/test results, encounter notes, upcoming appointments, etc.  Non-urgent messages can be sent to your provider as well.   To learn more about what you can do with MyChart, go to [email or web address].    Your next appointment:   Follow up as needed    Provider:   Dr. Rennis Golden    Other Instructions

## 2023-06-15 NOTE — Progress Notes (Addendum)
 LIPID CLINIC CONSULT NOTE  Chief Complaint:  Manage dyslipidemia  Primary Care Physician: Mliss Sax, MD  Primary Cardiologist:  Lesleigh Noe, MD (Inactive)  HPI:  Jenny Wright is a 58 y.o. female who is being seen today for the evaluation of dyslipidemia at the request of Mliss Sax,*. this is a pleasant 58 year old female, referred for evaluation management of dyslipidemia.  She was initially referred by Dr. Katrinka Blazing prior to his retirement and subsequently has been seen by Tereso Newcomer, PA-C and recently Dr. Duke Salvia.  She underwent coronary calcium scoring which was 0, but has had elevated cholesterol.  She wanted to see if she could manage this with diet and exercise.  She had lipid testing in April of this year.  Her NMR lipid profile showed a particle number of 1007 and 61, LDL-C of 147, HDL-C was 71 and triglycerides were 79, her small LDL particle number was low at 523 with an LDL size of 21.3 nm.  Although her cholesterol numbers are elevated, she does have a high HDL cholesterol with no evidence of any coronary artery calcification she may be cardioprotective.  There is heart disease in the family including her father.  She was planning with diet and lifestyle modifications however was not able to do a lot of that because she said of issues with scoliosis and back pain.  Repeat labs were ordered by Dr. Duke Salvia however she did not have those drawn prior to this appointment.  This included an LP(a) assessment which I am interested in seeing.  06/15/2023  Jenny Wright seen today in follow-up.  She was referred back by her primary care provider.  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  Unfortunately, seems that she could  not tolerate this due to side effects. Prior to starting statin therapy, her cholesterol already had improved with total cholesterol 190, triglycerides 69, HDL 51 and LDL 122 (down from 170).  This was with diet and lifestyle modification.  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 Last Assessment & Plan:  Discussed possible hematology consult in future for more definitive dx.   Throat irritation 03/19/2019   Tinea pedis 08/29/2012   Uterine fibroid 02/13/2012   Uterine leiomyoma 02/13/2012    Past Surgical History:  Procedure Laterality Date  BACK SURGERY     BREAST BIOPSY Right 05/12/2022   MM RT BREAST BX W LOC DEV 1ST LESION IMAGE BX SPEC STEREO GUIDE 05/12/2022 [facility]   CESAREAN SECTION     harrington rod  1980    FAMHx:  Family History  Problem Relation Age of Onset   Breast cancer Mother        deceased   Heart disease Father        deceased   Lung cancer Brother        deceased    SOCHx:    reports that she has never smoked. She has never used smokeless tobacco. She reports that she does not drink alcohol and does not use drugs.  ALLERGIES:  Allergies  Allergen Reactions   Penicillins Anaphylaxis, Shortness Of Breath and Swelling    SOB     Hydrochlorothiazide     Possible intolerance (numbness, tingling)   Methylprednisolone Other (See Comments)    shakes   Omeprazole Palpitations    ROS: Pertinent items noted in HPI and remainder of comprehensive ROS otherwise negative.  HOME MEDS: Current Outpatient Medications on File Prior to Visit  Medication Sig Dispense Refill   ACCU-CHEK GUIDE test strip USE AS DIRECTED EVERY OTHER DAY TO CHECK FASTING MORNING BLOOD SUGARS 50 strip 0   b complex vitamins capsule Take 1 capsule by mouth daily.     blood glucose meter kit and supplies KIT Dispense based on patient and insurance preference.  Please check fasting morning blood sugars every other day. 1 each 0   cetirizine (ZYRTEC) 10 MG tablet Take 1 tablet (10 mg total) by mouth daily. 30 tablet 11   Cholecalciferol (VITAMIN D) 50 MCG (2000 UT) tablet Take 2,000 Units by mouth daily.     [employer] VITAMIN C 1000 MG tablet Take 2,000 mg by mouth daily.     diclofenac Sodium (VOLTAREN) 1 % GEL 2 gram qid prn 100 g 3   ferrous gluconate (FERGON) 324 MG tablet Take 2 tablets by mouth daily with breakfast.     Multiple Vitamin (MULTI-VITAMIN) tablet Take 1 tablet by mouth daily.     zinc gluconate 50 MG tablet Take 50 mg by mouth daily.     No current facility-administered medications on file prior to visit.    LABS/IMAGING: No results found for this or any previous visit (from the past 48 hours). No results found.  LIPID PANEL:    Component Value Date/Time   CHOL 190 03/13/2023 0905   TRIG 69.0 03/13/2023 0905   HDL 54.30 03/13/2023 0905   CHOLHDL 3 03/13/2023 0905   VLDL 13.8 03/13/2023 0905   LDLCALC 122 (H) 03/13/2023 0905   LDLDIRECT 120.0 06/10/2021 0755     WEIGHTS: Wt Readings from Last 3 Encounters:  06/15/23 163 lb (73.9 kg)  03/13/23 162 lb (73.5 kg)  02/13/23 163 lb 14.4 oz (74.3 kg)    VITALS: BP 120/68 (BP Location: Right Arm, Patient Position: Sitting, Cuff Size: Normal)   Pulse 60   Ht 5\' 7"  (1.702 m)   Wt 163 lb (73.9 kg)   LMP 06/20/2017   SpO2 98%   BMI 25.53 kg/m   EXAM: Deferred  EKG: Deferred  ASSESSMENT: Mixed dyslipidemia, goal LDL less than 70 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  Congenital versus atherosclerotic Cerebral aneurysm 0 CAC score Family history of first-degree relative with coronary artery disease  PLAN: 1.   Jenny Wright was not found to  have any coronary calcium but does have some stenosis of the right ICA and vertebral artery by CT angiogram of the neck in 2023.  It is not clear if this is congenital versus atherosclerotic however for cardiovascular risk reduction we would recommend lipid-lowering to target LDL less than 70.  She was started on statin therapy briefly but had side effects from rosuvastatin.  She had made major improvement in her cholesterol with diet alone.  I would like to repeat her lipids now.  If she remains above target would consider alternative statin therapy to reach goal.  She is in agreement with this plan.  TIME SPENT WITH PATIENT: 25 minutes of direct patient care. More than 50% of that time was spent on coordination of care and counseling regarding dyslipidemia, personally reviewing prior imaging studies, discussing guideline recommendations and answering questions.   Chrystie Nose, MD, [facility], FACP  [place]  [facility]  Medical Director of the Advanced Lipid Disorders &  Cardiovascular Risk Reduction Clinic Diplomate of the American Board of Clinical Lipidology Attending Cardiologist  Direct Dial: [phone]  Fax: [phone]  Website:  [email or web address] 06/15/2023, 8:13 AM

## 2023-06-16 LAB — LIPID PANEL
Chol/HDL Ratio: 3.8 ratio (ref 0.0–4.4)
Cholesterol, Total: 224 mg/dL — ABNORMAL HIGH (ref 100–199)
HDL: 59 mg/dL (ref >39)
LDL Chol Calc (NIH): 155 mg/dL — ABNORMAL HIGH (ref 0–99)
Triglycerides: 56 mg/dL (ref 0–149)
VLDL Cholesterol Cal: 10 mg/dL (ref 5–40)

## 2023-06-16 LAB — NMR, LIPOPROFILE
Cholesterol, Total: 217 mg/dL — ABNORMAL HIGH (ref 100–199)
HDL Particle Number: 30.8 umol/L (ref >=30.5)
HDL-C: 62 mg/dL (ref >39)
LDL Particle Number: 1589 nmol/L — ABNORMAL HIGH (ref <1000)
LDL Size: 21.7 nm (ref >20.5)
LDL-C (NIH Calc): 144 mg/dL — ABNORMAL HIGH (ref 0–99)
LP-IR Score: <25 (ref <=45)
Small LDL Particle Number: 349 nmol/L (ref <=527)
Triglycerides: 65 mg/dL (ref 0–149)

## 2023-07-11 DIAGNOSIS — K219 Gastro-esophageal reflux disease without esophagitis: Secondary | ICD-10-CM | POA: Diagnosis not present

## 2023-07-11 DIAGNOSIS — H6123 Impacted cerumen, bilateral: Secondary | ICD-10-CM | POA: Diagnosis not present

## 2023-08-02 ENCOUNTER — Ambulatory Visit (HOSPITAL_BASED_OUTPATIENT_CLINIC_OR_DEPARTMENT_OTHER): Payer: Self-pay | Admitting: *Deleted

## 2023-09-11 ENCOUNTER — Ambulatory Visit: Payer: BC Managed Care – PPO | Admitting: Family Medicine

## 2023-09-13 DIAGNOSIS — B351 Tinea unguium: Secondary | ICD-10-CM | POA: Diagnosis not present

## 2023-09-13 DIAGNOSIS — M79671 Pain in right foot: Secondary | ICD-10-CM | POA: Diagnosis not present

## 2023-09-13 DIAGNOSIS — M79672 Pain in left foot: Secondary | ICD-10-CM | POA: Diagnosis not present

## 2023-09-28 DIAGNOSIS — K5900 Constipation, unspecified: Secondary | ICD-10-CM | POA: Diagnosis not present

## 2023-09-28 DIAGNOSIS — K219 Gastro-esophageal reflux disease without esophagitis: Secondary | ICD-10-CM | POA: Diagnosis not present

## 2023-10-26 ENCOUNTER — Encounter: Payer: Self-pay | Admitting: Family Medicine

## 2023-10-26 ENCOUNTER — Ambulatory Visit: Admitting: Family Medicine

## 2023-10-26 VITALS — BP 120/68 | HR 68 | Temp 97.7°F | Ht 67.0 in | Wt 163.4 lb

## 2023-10-26 DIAGNOSIS — F419 Anxiety disorder, unspecified: Secondary | ICD-10-CM

## 2023-10-26 DIAGNOSIS — R4582 Worries: Secondary | ICD-10-CM

## 2023-10-26 DIAGNOSIS — R7303 Prediabetes: Secondary | ICD-10-CM

## 2023-10-26 DIAGNOSIS — M549 Dorsalgia, unspecified: Secondary | ICD-10-CM

## 2023-10-26 LAB — COMPREHENSIVE METABOLIC PANEL WITH GFR
ALT: 15 U/L (ref 0–35)
AST: 16 U/L (ref 0–37)
Albumin: 4.3 g/dL (ref 3.5–5.2)
Alkaline Phosphatase: 65 U/L (ref 39–117)
BUN: 12 mg/dL (ref 6–23)
CO2: 25 meq/L (ref 19–32)
Calcium: 8.9 mg/dL (ref 8.4–10.5)
Chloride: 104 meq/L (ref 96–112)
Creatinine, Ser: 0.75 mg/dL (ref 0.40–1.20)
GFR: 87.97 mL/min (ref >60.00)
Glucose, Bld: 110 mg/dL — ABNORMAL HIGH (ref 70–99)
Potassium: 4.1 meq/L (ref 3.5–5.1)
Sodium: 141 meq/L (ref 135–145)
Total Bilirubin: 0.5 mg/dL (ref 0.2–1.2)
Total Protein: 6.7 g/dL (ref 6.0–8.3)

## 2023-10-26 LAB — CBC WITH DIFFERENTIAL/PLATELET
Basophils Absolute: 0 K/uL (ref 0.0–0.1)
Basophils Relative: 0.6 % (ref 0.0–3.0)
Eosinophils Absolute: 0 K/uL (ref 0.0–0.7)
Eosinophils Relative: 0.8 % (ref 0.0–5.0)
HCT: 39 % (ref 36.0–46.0)
Hemoglobin: 13 g/dL (ref 12.0–15.0)
Lymphocytes Relative: 38.6 % (ref 12.0–46.0)
Lymphs Abs: 1.4 K/uL (ref 0.7–4.0)
MCHC: 33.4 g/dL (ref 30.0–36.0)
MCV: 87.8 fl (ref 78.0–100.0)
Monocytes Absolute: 0.3 K/uL (ref 0.1–1.0)
Monocytes Relative: 8.5 % (ref 3.0–12.0)
Neutro Abs: 1.9 K/uL (ref 1.4–7.7)
Neutrophils Relative %: 51.5 % (ref 43.0–77.0)
Platelets: 207 K/uL (ref 150.0–400.0)
RBC: 4.44 Mil/uL (ref 3.87–5.11)
RDW: 13.4 % (ref 11.5–15.5)
WBC: 3.7 K/uL — ABNORMAL LOW (ref 4.0–10.5)

## 2023-10-26 LAB — TSH: TSH: 0.99 u[IU]/mL (ref 0.35–5.50)

## 2023-10-26 LAB — HEMOGLOBIN A1C: Hgb A1c MFr Bld: 6.2 % (ref 4.6–6.5)

## 2023-10-26 NOTE — Progress Notes (Signed)
 Established Patient Office Visit   Subjective:  Patient ID: Jenny Wright, female    DOB: Jun 28, 1965  Age: 58 y.o. MRN: [id number]  Chief Complaint  Patient presents with   Medical Management of Chronic Issues    Handicap form to be filled out, fear of safety at workplace.    HPI Encounter Diagnoses  Name Primary?   Back pain, unspecified back location, unspecified back pain laterality, unspecified chronicity Yes   Prediabetes    Worries    Anxiety    For follow-up of above.  Deals with general back pain thought to be associated with scoliosis.  She is status post surgery for scoliosis.  Says that her back doctor retired.  She is concerned that she may have been poisoned.  Distant history of Bell's palsy.  A certain person had accompanied her to the doctor when she was diagnosed with a Bell's palsy.  She had always been concerned that this person may have poisoned her.  Coincidentally she was out to lunch with this person and other people the other day.  The server seem to be friendly with the person of concern and she is now worried that the server may have poisoned her.   Review of Systems  Constitutional: Negative.   HENT: Negative.    Eyes:  Negative for blurred vision, discharge and redness.  Respiratory: Negative.    Cardiovascular: Negative.   Gastrointestinal:  Negative for abdominal pain.  Genitourinary: Negative.   Musculoskeletal:  Positive for back pain. Negative for myalgias.  Skin:  Negative for rash.  Neurological:  Negative for tingling, loss of consciousness and weakness.  Endo/Heme/Allergies:  Negative for polydipsia.      10/26/2023   10:45 AM 03/13/2023    8:54 AM 03/25/2022    3:43 PM  Depression screen PHQ 2/9  Decreased Interest 0 0 0  Down, Depressed, Hopeless 1 0 1  PHQ - 2 Score 1 0 1  Altered sleeping 1 0 1  Tired, decreased energy 0 0 0  Change in appetite 0 0 0  Feeling bad or failure about yourself  1 0 0  Trouble concentrating 0 0 0   Moving slowly or fidgety/restless 0 0 0  Suicidal thoughts 0 0 0  PHQ-9 Score 3 0 2  Difficult doing work/chores Very difficult Not difficult at all Not difficult at all       Current Outpatient Medications:    ACCU-CHEK GUIDE test strip, USE AS DIRECTED EVERY OTHER DAY TO CHECK FASTING MORNING BLOOD SUGARS, Disp: 50 strip, Rfl: 0   b complex vitamins capsule, Take 1 capsule by mouth daily., Disp: , Rfl:    blood glucose meter kit and supplies KIT, Dispense based on patient and insurance preference.  Please check fasting morning blood sugars every other day., Disp: 1 each, Rfl: 0   cetirizine  (ZYRTEC ) 10 MG tablet, Take 1 tablet (10 mg total) by mouth daily., Disp: 30 tablet, Rfl: 11   Cholecalciferol (VITAMIN D) 50 MCG (2000 UT) tablet, Take 2,000 Units by mouth daily., Disp: , Rfl:    CVS VITAMIN C 1000 MG tablet, Take 2,000 mg by mouth daily., Disp: , Rfl:    diclofenac  Sodium (VOLTAREN ) 1 % GEL, 2 gram qid prn, Disp: 100 g, Rfl: 3   ferrous gluconate (FERGON) 324 MG tablet, Take 2 tablets by mouth daily with breakfast., Disp: , Rfl:    Multiple Vitamin (MULTI-VITAMIN) tablet, Take 1 tablet by mouth daily., Disp: , Rfl:  zinc gluconate 50 MG tablet, Take 50 mg by mouth daily., Disp: , Rfl:    Objective:     BP 120/68 (BP Location: Right Arm, Patient Position: Sitting, Cuff Size: Normal)   Pulse 68   Temp 97.7 F (36.5 C) (Temporal)   Ht 5' 7 (1.702 m)   Wt 163 lb 6.4 oz (74.1 kg)   LMP 06/20/2017   SpO2 98%   BMI 25.59 kg/m  BP Readings from Last 3 Encounters:  10/26/23 120/68  06/15/23 120/68  03/13/23 118/72   Wt Readings from Last 3 Encounters:  10/26/23 163 lb 6.4 oz (74.1 kg)  06/15/23 163 lb (73.9 kg)  03/13/23 162 lb (73.5 kg)      Physical Exam Constitutional:      General: She is not in acute distress.    Appearance: Normal appearance. She is not ill-appearing, toxic-appearing or diaphoretic.  HENT:     Head: Normocephalic and atraumatic.     Right  Ear: External ear normal.     Left Ear: External ear normal.  Eyes:     General: No scleral icterus.       Right eye: No discharge.        Left eye: No discharge.     Extraocular Movements: Extraocular movements intact.     Conjunctiva/sclera: Conjunctivae normal.  Pulmonary:     Effort: Pulmonary effort is normal. No respiratory distress.  Musculoskeletal:       Arms:  Skin:    General: Skin is warm and dry.  Neurological:     Mental Status: She is alert and oriented to person, place, and time.  Psychiatric:        Mood and Affect: Mood normal.        Behavior: Behavior normal.      No results found for any visits on 10/26/23.    The 10-year ASCVD risk score (Arnett DK, et al., 2019) is: 3.6%    Assessment & Plan:   Back pain, unspecified back location, unspecified back pain laterality, unspecified chronicity -     CBC with Differential/Platelet -     Ambulatory referral to Sports Medicine  Prediabetes -     Comprehensive metabolic panel with GFR -     Hemoglobin A1c  Worries -     TSH -     Urine drugs of abuse scrn w alc, routine (Ref Lab)  Anxiety    Return in about 6 months (around 04/27/2024), or if symptoms worsen or fail to improve.  Recommended counseling.  Patient declines at this time.  Elsie Sim Lent, MD

## 2023-10-27 ENCOUNTER — Ambulatory Visit: Payer: Self-pay | Admitting: Family Medicine

## 2023-10-27 LAB — URINE DRUGS OF ABUSE SCREEN W ALC, ROUTINE (REF LAB)
Amphetamines, Urine: NEGATIVE ng/mL
Barbiturate Quant, Ur: NEGATIVE ng/mL
Benzodiazepine Quant, Ur: NEGATIVE ng/mL
Cannabinoid Quant, Ur: NEGATIVE ng/mL
Cocaine (Metab.): NEGATIVE ng/mL
Creatinine, Urine: 116.6 mg/dL (ref 20.0–300.0)
Ethanol, Urine: NEGATIVE %
Methadone Screen, Urine: NEGATIVE ng/mL
Nitrite Urine, Quantitative: NEGATIVE ug/mL
OPIATE SCREEN URINE: NEGATIVE ng/mL
PCP Quant, Ur: NEGATIVE ng/mL
Propoxyphene: NEGATIVE ng/mL
pH, Urine: 5.6 (ref 4.5–8.9)

## 2023-10-31 ENCOUNTER — Ambulatory Visit
Admission: EM | Admit: 2023-10-31 | Discharge: 2023-10-31 | Disposition: A | Discharge status: Home / Self Care | Attending: Family Medicine | Admitting: Family Medicine

## 2023-10-31 ENCOUNTER — Ambulatory Visit (INDEPENDENT_AMBULATORY_CARE_PROVIDER_SITE_OTHER)

## 2023-10-31 ENCOUNTER — Ambulatory Visit (HOSPITAL_COMMUNITY)

## 2023-10-31 DIAGNOSIS — R0789 Other chest pain: Secondary | ICD-10-CM

## 2023-10-31 DIAGNOSIS — R079 Chest pain, unspecified: Secondary | ICD-10-CM | POA: Diagnosis not present

## 2023-10-31 LAB — POCT URINE DIPSTICK
Bilirubin, UA: NEGATIVE
Blood, UA: NEGATIVE
Glucose, UA: NEGATIVE mg/dL
Ketones, POC UA: NEGATIVE mg/dL
Leukocytes, UA: NEGATIVE
Nitrite, UA: NEGATIVE
POC PROTEIN,UA: NEGATIVE
Spec Grav, UA: 1.02 (ref 1.010–1.025)
Urobilinogen, UA: 0.2 U/dL
pH, UA: 5.5 (ref 5.0–8.0)

## 2023-10-31 LAB — POC SOFIA SARS ANTIGEN FIA: SARS Coronavirus 2 Ag: NEGATIVE

## 2023-10-31 MED ORDER — CYCLOBENZAPRINE HCL 5 MG PO TABS: 5.0000 mg | ORAL_TABLET | Freq: Every evening | ORAL | 0 refills | Status: DC | PRN

## 2023-10-31 NOTE — ED Triage Notes (Addendum)
 Patient presents with right side rib pain that is radiating to her back x 3 days. Patient states she had chills x 2-3 days. Denies any fever or diarrhea.

## 2023-10-31 NOTE — ED Provider Notes (Signed)
 [facility]  Note:  This document was prepared using Conservation officer, historic buildings and may include unintentional dictation errors.  MRN: [id number] DOB: 20-Apr-1965  Subjective:   Jenny Wright is a 58 y.o. female presenting for 3 day history of right sided chest wall pain, penetrating chest wall pain, crampy pain, has associated thoracic back pain. Has had GERD, has been burping.  Feels that the pain can radiate along the right lower ribs and up toward the sternum.  No fever, cough, shob, wheezing, heart racing, palpitations, nausea, vomiting, abdominal pain.  No fall, trauma. Has Factor V Leiden, antiphospholipid antibody positive, lupus.  Would like a COVID test and a chest x-ray.  Has a history of scoliosis, has had remote surgical intervention with a Harrington rod.  No current facility-administered medications for this encounter.  Current Outpatient Medications:    b complex vitamins capsule, Take 1 capsule by mouth daily., Disp: , Rfl:    cetirizine  (ZYRTEC ) 10 MG tablet, Take 1 tablet (10 mg total) by mouth daily., Disp: 30 tablet, Rfl: 11   Cholecalciferol (VITAMIN D) 50 MCG (2000 UT) tablet, Take 2,000 Units by mouth daily., Disp: , Rfl:    CVS VITAMIN C 1000 MG tablet, Take 2,000 mg by mouth daily., Disp: , Rfl:    Multiple Vitamin (MULTI-VITAMIN) tablet, Take 1 tablet by mouth daily., Disp: , Rfl:    zinc gluconate 50 MG tablet, Take 50 mg by mouth daily., Disp: , Rfl:    ACCU-CHEK GUIDE test strip, USE AS DIRECTED EVERY OTHER DAY TO CHECK FASTING MORNING BLOOD SUGARS, Disp: 50 strip, Rfl: 0   blood glucose meter kit and supplies KIT, Dispense based on patient and insurance preference.  Please check fasting morning blood sugars every other day., Disp: 1 each, Rfl: 0   diclofenac  Sodium (VOLTAREN ) 1 % GEL, 2 gram qid prn, Disp: 100 g, Rfl: 3   ferrous gluconate (FERGON) 324 MG tablet, Take 2 tablets by mouth daily with breakfast., Disp: , Rfl:     Allergies  Allergen Reactions   Penicillins Anaphylaxis, Shortness Of Breath and Swelling    SOB     Hydrochlorothiazide      Possible intolerance (numbness, tingling)   Methylprednisolone  Other (See Comments)    shakes   Omeprazole Palpitations    Past Medical History:  Diagnosis Date   Acute bilateral low back pain without sciatica 10/01/2020   Anemia    Antiphospholipid antibody positive 02/13/2012   Arthralgia of right temporomandibular joint 01/03/2017   Bell's palsy    Bilateral impacted cerumen 09/29/2015   BPPV (benign paroxysmal positional vertigo) 09/29/2015   DDD (degenerative disc disease), lumbar 10/07/2014   Factor V Leiden mutation complicating pregnancy (HCC)    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 Last Assessment & Plan:  Discussed possible hematology consult in future for more definitive dx.   Throat irritation 03/19/2019   Tinea pedis 08/29/2012   Uterine fibroid 02/13/2012   Uterine  leiomyoma 02/13/2012     Past Surgical History:  Procedure Laterality Date   BACK SURGERY     BREAST BIOPSY Right 05/12/2022   MM RT BREAST BX W LOC DEV 1ST LESION IMAGE BX SPEC STEREO GUIDE 05/12/2022 [facility]   CESAREAN SECTION     harrington rod  1980    Family History  Problem Relation Age of Onset   Breast cancer Mother        deceased   Heart disease Father        deceased   Lung cancer Brother        deceased     Social History   Tobacco Use   Smoking status: Never   Smokeless tobacco: Never  Vaping Use   Vaping status: Never Used  Substance Use Topics   Alcohol  use: No   Drug use: No    ROS   Objective:   Vitals: BP 138/85 (BP Location: Left Arm)   Pulse 73   Temp 98.5 F (36.9 C) (Oral)   Resp 17   LMP 06/20/2017   SpO2 96%   Physical Exam Constitutional:      General: She is not in acute distress.    Appearance: Normal appearance. She is well-developed. She is not ill-appearing, toxic-appearing or diaphoretic.  HENT:     Head: Normocephalic and atraumatic.     Nose: Nose normal.     Mouth/Throat:     Mouth: Mucous membranes are moist.     Pharynx: Oropharynx is clear.  Eyes:     General: No scleral icterus.       Right eye: No discharge.        Left eye: No discharge.     Extraocular Movements: Extraocular movements intact.     Conjunctiva/sclera: Conjunctivae normal.  Cardiovascular:     Rate and Rhythm: Normal rate and regular rhythm.     Heart sounds: Normal heart sounds. No murmur heard.    No friction rub. No gallop.  Pulmonary:     Effort: Pulmonary effort is normal. No respiratory distress.     Breath sounds: No stridor. No wheezing, rhonchi or rales.  Chest:     Chest wall: No tenderness.  Abdominal:     General: Bowel sounds are normal. There is no distension.     Palpations: Abdomen is soft. There is no mass.     Tenderness: There is no abdominal tenderness. There is no right CVA tenderness, left CVA tenderness, guarding or rebound.  Skin:    General: Skin is warm and dry.  Neurological:     General: No focal deficit present.     Mental Status: She is alert and oriented to person, place, and time.  Psychiatric:        Mood and Affect: Mood normal.        Behavior: Behavior normal.        Thought Content: Thought content normal.        Judgment: Judgment normal.     Results for orders placed or performed during the hospital encounter of  10/31/23 (from the past 24 hours)  POCT URINE DIPSTICK     Status: None   Collection Time: 10/31/23  7:07 PM  Result Value Ref Range   Color, UA yellow yellow   Clarity, UA clear clear   Glucose, UA negative negative mg/dL   Bilirubin, UA negative negative   Ketones, POC UA negative negative mg/dL   Spec Grav, UA 8.979 8.989 - 1.025  Blood, UA negative negative   pH, UA 5.5 5.0 - 8.0   POC PROTEIN,UA negative negative, trace   Urobilinogen, UA 0.2 0.2 or 1.0 E.U./dL   Nitrite, UA Negative Negative   Leukocytes, UA Negative Negative   Point-of-care COVID test was negative.  Assessment and Plan :   PDMP not reviewed this encounter.  1. Atypical chest pain    X-ray over-read was pending at time of discharge, recommended follow up with only abnormal results. Otherwise will not call for negative over-read. Patient was in agreement.   Discussed possibility of pulmonary embolism but at this stage, patient plans on deferring an ER visit.  Recommend conservative management for atypical chest pain.  Recheck with her PCP and cardiologist ASAP.  Will follow-up with her chest x-ray results and next day as our clinic is currently closed.  Counseled patient on potential for adverse effects with medications prescribed/recommended today, ER and return-to-clinic precautions discussed, patient verbalized understanding.    Christopher Savannah, [place] 11/03/23 [phone]

## 2023-11-01 ENCOUNTER — Ambulatory Visit (HOSPITAL_COMMUNITY): Payer: Self-pay

## 2023-11-08 ENCOUNTER — Ambulatory Visit

## 2023-11-08 VITALS — BP 128/82 | Ht 67.0 in | Wt 163.0 lb

## 2023-11-08 DIAGNOSIS — G8929 Other chronic pain: Secondary | ICD-10-CM | POA: Diagnosis not present

## 2023-11-08 DIAGNOSIS — M545 Low back pain, unspecified: Secondary | ICD-10-CM | POA: Diagnosis not present

## 2023-11-08 DIAGNOSIS — M41125 Adolescent idiopathic scoliosis, thoracolumbar region: Secondary | ICD-10-CM | POA: Diagnosis not present

## 2023-11-08 NOTE — Progress Notes (Addendum)
 Subjective:    Patient ID: Jenny Wright, female    DOB: 58 y.o., April 24, 1965   MRN: [id number]  HPI  Chief Complaint: Back pain  58 year old female with past medical history significant for diabetes, scoliosis, SLE, antiphospholipid syndrome, factor V Leiden. Last seen by Dr. Tess on 10/26/2023: Because of general back pain thought to be associated with scoliosis. History of surgery for scoliosis. Her back surgeon has since retired. Today she reports continued back pain and has been bothering her slightly more for the past year Unsure of what exactly could have kicked this off. Ibuprofen  600 or 800 milligrams endorses using on occasion for flares (cannot recall which dose) physical therapy providing good amount of relief when she is able to afford this. Denies any weight loss, weakness, new numbness or tingling, night sweats, fevers, chills. She is also specifically requesting that I fill out a handicap placard renewal application form.  She states the last provider that felt this helped for her was a female at a practice in [place] associated with [facility].   Review of diagnostic radiographs obtained on 09/07/2021 of her lumbar spine revealing T10-L5 fusion hardware present with significant degenerative changes noted at the L5-S1 interface.  Prominent scoliotic curve in the lumbar thoracic region.  CT abdomen pelvis obtained on 03/05/2017 revealing scoliosis, stabilization hardware present extending into the lower thoracic spine and L5.  Advanced degenerative changes present.  No other musculoskeletal observations noted.     Objective:   Physical Exam Vitals:   11/08/23 1134  BP: 128/82   Cervical Spine -Inspection: no swelling or skin changes -Palpation: TTP + paraspinals. Paraspinal muscle hypertonicity. -AROM/PROM: FROM in all planes of the neck -Strength: full neck flexion/extension (C1/C2), neck sidebending (C3), shoulder shrug (C4), shoulder abduction (C5),  elbow flexion (C6), elbow extension (C7), thumb extension (C8/radial), finger abduction (T1/ulnar), OK sign (median) -Sensation: intact sensation of the thumb (C6), 2nd/3rd digits (C7), 4th/5th digits (C8). -Reflexes: normal biceps (C5/6), brachioradialis (C5/6), triceps (C7/8) reflexes, equal bilaterally  Lumbar Spine -Inspection: Well-healed linear incision present traversing from thoracic spine down through lumbar spine.  No other skin changes. -Palpation: No midline spinal tenderness.  Moderate tenderness to palpation in paraspinal musculature near the lumbosacral junction as well as the mid thoracic region. -AROM/PROM: Limited range of motion with rotation, sidebending, forward flexion, extension.  Sidebending only maneuver which caused her to complain of discomfort. -Sensation: Intact throughout bilateral lower extremities with exception of upper portion of left foot which patient reports she has dysesthesia since an accident where she dropped a heavy object onto the dorsum of her foot. -Special tests: Negative stork, negative straight leg raise.     Assessment & Plan:   Jenny Wright is a 58 year old female presenting with a flareup of chronic back pain she has experienced since a young age which she attributes to her well-documented scoliosis and history of T10-L5 spinal fusion.  After reviewing her prior pertinent imaging and examining her today I am overall encouraged by her level of pain and functioning stating she would only need to use her previously prescribed ibuprofen  2-3 times per year.  I reviewed with her available therapeutic options and their associated levels of evidence for the treatment of her back pain including heat, ice, exercise (walking in particular), physical therapy, NSAIDs, muscle relaxants, and short course of oral steroids.  After discussing with her the effect of walking on pain and function in chronic low back pain referencing the results of a systematic review from  November 2017 done by Deloris dunker al. and that based on her exam I believe filling this out would deprive her of the benefits of this and therefore not in her best interest. She certainly may have had an indication for receiving a handicap placard previously, but I do not see an indication for one today and I relayed this to the patient. The patient expressed disgruntlement at this (particularly given that a different provider had given her a handicap placard 5 years ago). Initially following my recommendation against a handicap placard, she was asking what she and her family would do when they go to [employer]?  She also followed this up by asking if it would be better for her to report this physician (myself) to his supervisor or to the medical board. After offering to prescribe a muscle relaxer and ibuprofen  to be used both now and as needed for future flares, the patient declined both of these stating that there is no point to this if she is going to have to go to another provider to get her handicap placard anyway and she then expressed desire to end the encounter. We discussed that if there are any changes (particularly red flag symptoms such as unexpected weight loss, fevers, chills, night sweats, weakness/numbness or worsening of her pain) to come back for re-evaluation. She stated she understands and agrees with this plan.

## 2023-11-13 ENCOUNTER — Encounter (HOSPITAL_COMMUNITY): Payer: Self-pay

## 2023-11-13 ENCOUNTER — Ambulatory Visit
Admission: EM | Admit: 2023-11-13 | Discharge: 2023-11-13 | Disposition: A | Discharge status: Home / Self Care | Attending: Family Medicine | Admitting: Family Medicine

## 2023-11-13 ENCOUNTER — Other Ambulatory Visit: Payer: Self-pay

## 2023-11-13 ENCOUNTER — Emergency Department (HOSPITAL_COMMUNITY)
Admission: EM | Admit: 2023-11-13 | Discharge: 2023-11-14 | Disposition: A | Discharge status: Against Medical Advice | Attending: Emergency Medicine | Admitting: Emergency Medicine

## 2023-11-13 DIAGNOSIS — R109 Unspecified abdominal pain: Secondary | ICD-10-CM | POA: Diagnosis not present

## 2023-11-13 DIAGNOSIS — Z5321 Procedure and treatment not carried out due to patient leaving prior to being seen by health care provider: Secondary | ICD-10-CM | POA: Insufficient documentation

## 2023-11-13 DIAGNOSIS — M549 Dorsalgia, unspecified: Secondary | ICD-10-CM | POA: Insufficient documentation

## 2023-11-13 DIAGNOSIS — R9389 Abnormal findings on diagnostic imaging of other specified body structures: Secondary | ICD-10-CM

## 2023-11-13 LAB — CBC
HCT: 37.5 % (ref 36.0–46.0)
Hemoglobin: 12.4 g/dL (ref 12.0–15.0)
MCH: 29.6 pg (ref 26.0–34.0)
MCHC: 33.1 g/dL (ref 30.0–36.0)
MCV: 89.5 fL (ref 80.0–100.0)
Platelets: 203 K/uL (ref 150–400)
RBC: 4.19 MIL/uL (ref 3.87–5.11)
RDW: 12.4 % (ref 11.5–15.5)
WBC: 4.6 K/uL (ref 4.0–10.5)
nRBC: 0 % (ref 0.0–0.2)

## 2023-11-13 NOTE — ED Triage Notes (Signed)
 Pt arrived from home via [facility] c/o right sided flank pain 9/10 on pain scale x 1 week. Pt states that she was told by Urgent care that she has a nodule somewhere on the right side and would like a CT to find out what the nodule is.

## 2023-11-13 NOTE — ED Triage Notes (Signed)
 Patient states she has radiating pain up her back off and on for the past week and a half. Denies injury or lifting anything heavy.

## 2023-11-13 NOTE — ED Provider Notes (Signed)
 Patient presented to the clinic after receiving a letter in the mail reporting her chest x-ray results.  Multiple attempts were made to reach patient by phone and ultimately had to report her chest x-ray results via letter.  She presents today with significant concern.   DG Chest 2 View Result Date: 10/31/2023 CLINICAL DATA:  Right chest pain EXAM: CHEST - 2 VIEW COMPARISON:  None Available. FINDINGS: The lungs are symmetrically well expanded. A rounded nodule has developed within the right cardiophrenic angle, new since prior examination and indeterminate. This may represent a pericardial lymph node, pericardial cyst pulmonary nodule. No focal consolidation. No pneumothorax or pleural effusion. Cardiac size within normal limits. Pulmonary vascularity is normal. Thoracolumbar spinal stabilization rod is partially visualized. Moderate thoracolumbar levoscoliosis is partially visualized. No acute bone abnormality. IMPRESSION: 1. No radiographic evidence of acute cardiopulmonary disease. 2. Interval development of a rounded nodule within the right cardiophrenic angle. This could be further assessed with nonemergent CT imaging. Electronically Signed   By: Dorethia Molt M.D.   On: 10/31/2023 20:18    I reviewed her chest x-ray results extensively for ~15 minutes.  Emphasized that a nonemergent CT scan is the next appropriate step to further detail the rounded nodule in the right cardiophrenic angle of her lung/right medial chest area.  Patient expressed significant concern and plans on presenting to the emergency room.  I advised against this recommend an outpatient chest CT scan.   Christopher Savannah, [place] 11/13/23 [id number]

## 2023-11-13 NOTE — ED Triage Notes (Signed)
 Patient arrives reporting back pain after being told at [facility] that she needed non-emergent CT for x-ray results from 2 weeks ago.

## 2023-11-14 LAB — BASIC METABOLIC PANEL WITH GFR
Anion gap: 7 (ref 5–15)
BUN: 13 mg/dL (ref 6–20)
CO2: 25 mmol/L (ref 22–32)
Calcium: 9.3 mg/dL (ref 8.9–10.3)
Chloride: 106 mmol/L (ref 98–111)
Creatinine, Ser: 0.8 mg/dL (ref 0.44–1.00)
GFR, Estimated: >60 mL/min (ref >60)
Glucose, Bld: 128 mg/dL — ABNORMAL HIGH (ref 70–99)
Potassium: 3.9 mmol/L (ref 3.5–5.1)
Sodium: 138 mmol/L (ref 135–145)

## 2023-11-17 ENCOUNTER — Encounter: Payer: Self-pay | Admitting: Nurse Practitioner

## 2023-11-17 ENCOUNTER — Ambulatory Visit: Admitting: Nurse Practitioner

## 2023-11-17 VITALS — BP 126/80 | HR 73 | Ht 67.0 in | Wt 157.8 lb

## 2023-11-17 DIAGNOSIS — R911 Solitary pulmonary nodule: Secondary | ICD-10-CM | POA: Diagnosis not present

## 2023-11-17 DIAGNOSIS — M51369 Other intervertebral disc degeneration, lumbar region without mention of lumbar back pain or lower extremity pain: Secondary | ICD-10-CM | POA: Diagnosis not present

## 2023-11-17 NOTE — Progress Notes (Signed)
 Established Patient Office Visit  Subjective   Patient ID: Jenny Wright, female    DOB: 16-Sep-1965  Age: 58 y.o. MRN: [id number]  Chief Complaint  Patient presents with   Abnormal Chest X-Ray    Follow up went to Urgent Care on 11/13/23 with chest pain, fatigue, request handicap placard    HPI  Discussed the use of AI scribe software for clinical note transcription with the patient, who gave verbal consent to proceed.  History of Present Illness   Jenny Wright is a 58 year old female who presents for follow-up after an urgent care visit revealing a lung nodule.  She experiences persistent back pain radiating up and down her back, without fever, chills, cough, night sweats, or weight loss. She is a non-smoker but has secondhand smoke exposure from neighbors. Fatigue occurs with prolonged activities like cooking and washing dishes.  She has scoliosis and arthritis, managed with Tylenol  as needed. Her back pain is consistent in location, with significant radiation prompting medication use. She has herniated discs since age 71 and a condition called 'flat back'.  She is frustrated with her primary care provider and a sports medicine referral, feeling her concerns about standing for long periods were not addressed. She seeks a handicap placard due to difficulty standing for more than fifteen minutes. She left a hospital after waiting twelve hours without being seen, due to responsibilities as a single parent.        ROS See pertinent positives and negatives per HPI.    Objective:     BP 126/80 (BP Location: Left Arm, Patient Position: Sitting, Cuff Size: Normal)   Pulse 73   Ht 5' 7 (1.702 m)   Wt 157 lb 12.8 oz (71.6 kg)   LMP 06/20/2017   SpO2 96%   Breastfeeding No   BMI 24.71 kg/m    Physical Exam Vitals and nursing note reviewed.  Constitutional:      General: She is not in acute distress.    Appearance: Normal appearance.  HENT:     Head:  Normocephalic.  Eyes:     Conjunctiva/sclera: Conjunctivae normal.  Cardiovascular:     Rate and Rhythm: Normal rate and regular rhythm.     Pulses: Normal pulses.     Heart sounds: Normal heart sounds.  Pulmonary:     Effort: Pulmonary effort is normal.     Breath sounds: Normal breath sounds.  Musculoskeletal:     Cervical back: Normal range of motion.  Skin:    General: Skin is warm.  Neurological:     General: No focal deficit present.     Mental Status: She is alert and oriented to person, place, and time.  Psychiatric:        Mood and Affect: Mood normal.        Behavior: Behavior normal.        Thought Content: Thought content normal.        Judgment: Judgment normal.    The 10-year ASCVD risk score (Arnett DK, et al., 2019) is: 4.2%    Assessment & Plan:   Problem List Items Addressed This Visit       Respiratory   Lung nodule - Primary   A solitary pulmonary nodule was identified on chest x-ray. She has no symptoms such as cough, fever, night sweats, or weight loss. Although a non-smoker, she has been exposed to secondhand smoke. Further imaging is necessary. Order a CT scan of the chest to evaluate the  nodule.      Relevant Orders   CT CHEST WO CONTRAST     Musculoskeletal and Integument   DDD (degenerative disc disease), lumbar   She experiences chronic lumbar pain with scoliosis and arthritis, recently exacerbated. The pain radiates upwards, causing difficulty standing and fatigue. Current pain management involves Tylenol . Discussed that she would need to get this from her PCP or back specialist.        Return if symptoms worsen or fail to improve.    Tinnie DELENA Harada, NP

## 2023-11-17 NOTE — Patient Instructions (Signed)
 It was great to see you!  I have ordered a CT scan of your lung   Take care,  Tinnie Harada, NP

## 2023-11-17 NOTE — Assessment & Plan Note (Signed)
 She experiences chronic lumbar pain with scoliosis and arthritis, recently exacerbated. The pain radiates upwards, causing difficulty standing and fatigue. Current pain management involves Tylenol . Discussed that she would need to get this from her PCP or back specialist.

## 2023-11-17 NOTE — Assessment & Plan Note (Signed)
 A solitary pulmonary nodule was identified on chest x-ray. She has no symptoms such as cough, fever, night sweats, or weight loss. Although a non-smoker, she has been exposed to secondhand smoke. Further imaging is necessary. Order a CT scan of the chest to evaluate the nodule.

## 2023-11-21 ENCOUNTER — Telehealth: Payer: Self-pay | Admitting: Family Medicine

## 2023-11-22 NOTE — Telephone Encounter (Signed)
 error

## 2023-11-22 NOTE — Telephone Encounter (Signed)
 errror

## 2023-11-26 ENCOUNTER — Emergency Department (HOSPITAL_BASED_OUTPATIENT_CLINIC_OR_DEPARTMENT_OTHER): Admitting: Radiology

## 2023-11-26 ENCOUNTER — Emergency Department (HOSPITAL_BASED_OUTPATIENT_CLINIC_OR_DEPARTMENT_OTHER)
Admission: EM | Admit: 2023-11-26 | Discharge: 2023-11-26 | Disposition: A | Discharge status: Home / Self Care | Attending: Emergency Medicine | Admitting: Emergency Medicine

## 2023-11-26 ENCOUNTER — Emergency Department (HOSPITAL_BASED_OUTPATIENT_CLINIC_OR_DEPARTMENT_OTHER)

## 2023-11-26 DIAGNOSIS — K8020 Calculus of gallbladder without cholecystitis without obstruction: Secondary | ICD-10-CM | POA: Insufficient documentation

## 2023-11-26 DIAGNOSIS — R001 Bradycardia, unspecified: Secondary | ICD-10-CM | POA: Diagnosis not present

## 2023-11-26 DIAGNOSIS — R079 Chest pain, unspecified: Secondary | ICD-10-CM | POA: Diagnosis not present

## 2023-11-26 DIAGNOSIS — I10 Essential (primary) hypertension: Secondary | ICD-10-CM | POA: Diagnosis not present

## 2023-11-26 DIAGNOSIS — M545 Low back pain, unspecified: Secondary | ICD-10-CM | POA: Insufficient documentation

## 2023-11-26 DIAGNOSIS — R011 Cardiac murmur, unspecified: Secondary | ICD-10-CM | POA: Diagnosis not present

## 2023-11-26 DIAGNOSIS — M549 Dorsalgia, unspecified: Secondary | ICD-10-CM | POA: Diagnosis not present

## 2023-11-26 LAB — BASIC METABOLIC PANEL WITH GFR
Anion gap: 14 (ref 5–15)
BUN: 9 mg/dL (ref 6–20)
CO2: 22 mmol/L (ref 22–32)
Calcium: 10.1 mg/dL (ref 8.9–10.3)
Chloride: 104 mmol/L (ref 98–111)
Creatinine, Ser: 0.84 mg/dL (ref 0.44–1.00)
GFR, Estimated: >60 mL/min (ref >60)
Glucose, Bld: 154 mg/dL — ABNORMAL HIGH (ref 70–99)
Potassium: 3.9 mmol/L (ref 3.5–5.1)
Sodium: 140 mmol/L (ref 135–145)

## 2023-11-26 LAB — CBC
HCT: 36.6 % (ref 36.0–46.0)
Hemoglobin: 12.5 g/dL (ref 12.0–15.0)
MCH: 29.7 pg (ref 26.0–34.0)
MCHC: 34.2 g/dL (ref 30.0–36.0)
MCV: 86.9 fL (ref 80.0–100.0)
Platelets: 192 K/uL (ref 150–400)
RBC: 4.21 MIL/uL (ref 3.87–5.11)
RDW: 12.7 % (ref 11.5–15.5)
WBC: 3.2 K/uL — ABNORMAL LOW (ref 4.0–10.5)
nRBC: 0 % (ref 0.0–0.2)

## 2023-11-26 MED ORDER — SODIUM CHLORIDE 0.9 % IV BOLUS
500.0000 mL | Freq: Once | INTRAVENOUS | Status: AC
  Administered 2023-11-26: 500 mL via INTRAVENOUS

## 2023-11-26 MED ORDER — CYCLOBENZAPRINE HCL 5 MG PO TABS: 5.0000 mg | ORAL_TABLET | Freq: Every evening | ORAL | 0 refills | Status: AC | PRN

## 2023-11-26 MED ORDER — IOHEXOL 350 MG/ML SOLN
75.0000 mL | Freq: Once | INTRAVENOUS | Status: AC | PRN
  Administered 2023-11-26: 61 mL via INTRAVENOUS

## 2023-11-26 MED ORDER — KETOROLAC TROMETHAMINE 15 MG/ML IJ SOLN
15.0000 mg | Freq: Once | INTRAMUSCULAR | Status: DC
  Filled 2023-11-26: qty 1

## 2023-11-26 NOTE — Discharge Instructions (Signed)
 You have been evaluated for your symptoms.  Fortunately CT scan today did not show any pulmonary nodule inside your lungs and no evidence of any blood clot within your lungs.  No signs of pneumonia.  Incidentally CT did show evidence of gallstones.  It is not likely to be the cause of your pain however you may follow-up with general surgery outpatient for further evaluation and management.

## 2023-11-26 NOTE — ED Provider Notes (Signed)
 [place] EMERGENCY DEPARTMENT AT [facility] Provider Note   CSN: [id number] Arrival date & time: 11/26/23  1252     Patient presents with: Back Pain   Jenny Wright is a 58 y.o. female.   The history is provided by the patient and medical records. No language interpreter was used.  Back Pain    58 year old female history of uterine fibroid, factor V Leiden, degenerative disc disease, lupus, hypertension presenting complaining of back pain.  Patient states for the past several weeks she has had pain to her back that radiates towards the chest.  Pain is waxing waning, sharp stabbing sometimes can be intense.  She also feel fatigue having some occasional bouts of shortness of breath.  She mention she was seen at the urgent care center recently for her complaint.  An x-ray was done at that time and she was told that she has a abnormal nodule on the x-ray that needs a CT scan.  Despite waiting for the CT scan she has not received a CT scan yet which concerns her.  She denies any prior history of a PE DVT she denies productive cough or hemoptysis no fever or chills no abdominal pain no dysuria no numbness or weakness.  She has been taking Tylenol  at home without adequate relief.  Prior to Admission medications   Medication Sig Start Date End Date Taking? Authorizing Provider  ACCU-CHEK GUIDE test strip USE AS DIRECTED EVERY OTHER DAY TO CHECK FASTING MORNING BLOOD SUGARS 12/06/21   Berneta Elsie Sayre, MD  b complex vitamins capsule Take 1 capsule by mouth daily.    [provider]  blood glucose meter kit and supplies KIT Dispense based on patient and insurance preference.  Please check fasting morning blood sugars every other day. 09/06/21   Berneta Elsie Sayre, MD  cetirizine  (ZYRTEC ) 10 MG tablet Take 1 tablet (10 mg total) by mouth daily. 03/13/23   Berneta Elsie Sayre, MD  Cholecalciferol (VITAMIN D) 50 MCG (2000 UT) tablet Take 2,000 Units by mouth daily.  07/27/20   [provider]  [employer] VITAMIN C 1000 MG tablet Take 2,000 mg by mouth daily. 07/27/20   [provider]  cyclobenzaprine  (FLEXERIL ) 5 MG tablet Take 1 tablet (5 mg total) by mouth at bedtime as needed. 10/31/23   Christopher Savannah, PA-C  diclofenac  Sodium (VOLTAREN ) 1 % GEL 2 gram qid prn 09/14/21   Onita Duos, MD  ferrous gluconate (FERGON) 324 MG tablet Take 2 tablets by mouth daily with breakfast.    [provider]  Multiple Vitamin (MULTI-VITAMIN) tablet Take 1 tablet by mouth daily.    [provider]  zinc gluconate 50 MG tablet Take 50 mg by mouth daily. 07/27/20   [provider]    Allergies: Penicillins, Hydrochlorothiazide , Methylprednisolone , and Omeprazole    Review of Systems  Musculoskeletal:  Positive for back pain.  All other systems reviewed and are negative.   Updated Vital Signs BP (!) 171/91   Pulse 70   Temp 98.2 F (36.8 C)   Resp 20   LMP 06/20/2017   SpO2 99%   Physical Exam Vitals and nursing note reviewed.  Constitutional:      General: She is not in acute distress.    Appearance: She is well-developed.  HENT:     Head: Atraumatic.  Eyes:     Conjunctiva/sclera: Conjunctivae normal.  Cardiovascular:     Rate and Rhythm: Normal rate and regular rhythm.     Heart  sounds: Murmur heard.  Pulmonary:     Effort: Pulmonary effort is normal.     Breath sounds: No wheezing, rhonchi or rales.  Abdominal:     Palpations: Abdomen is soft.     Tenderness: There is no abdominal tenderness.  Musculoskeletal:        General: No tenderness (No significant tenderness to palpation left midline spine).     Cervical back: Neck supple.  Skin:    Findings: No rash.  Neurological:     Mental Status: She is alert.  Psychiatric:        Mood and Affect: Mood normal.     (all labs ordered are listed, but only abnormal results are displayed) Labs Reviewed  BASIC METABOLIC PANEL WITH GFR - Abnormal; Notable for the  following components:      Result Value   Glucose, Bld 154 (*)    All other components within normal limits  CBC - Abnormal; Notable for the following components:   WBC 3.2 (*)    All other components within normal limits    EKG: EKG Interpretation Date/Time:  Sunday November 26 2023 13:07:54 EDT Ventricular Rate:  53 PR Interval:  166 QRS Duration:  66 QT Interval:  408 QTC Calculation: 382 R Axis:   61  Text Interpretation: Sinus bradycardia Otherwise normal ECG When compared with ECG of 13-Feb-2023 08:14, No significant change was found when compared top rior, similar appearance with more artifact No STEMI Confirmed by Ginger Barefoot ([id number]) on 11/26/2023 1:12:50 PM  Radiology: CT Angio Chest PE W and/or Wo Contrast Result Date: 11/26/2023 CLINICAL DATA:  Pulmonary embolism (PE) suspected, high prob. Back pain. EXAM: CT ANGIOGRAPHY CHEST WITH CONTRAST TECHNIQUE: Multidetector CT imaging of the chest was performed using the standard protocol during bolus administration of intravenous contrast. Multiplanar CT image reconstructions and MIPs were obtained to evaluate the vascular anatomy. RADIATION DOSE REDUCTION: This exam was performed according to the departmental dose-optimization program which includes automated exposure control, adjustment of the mA and/or kV according to patient size and/or use of iterative reconstruction technique. CONTRAST:  61mL OMNIPAQUE  IOHEXOL  350 MG/ML SOLN COMPARISON:  11/13/2020 FINDINGS: Cardiovascular: Heart is normal size. Aorta is normal caliber. No filling defects in the pulmonary arteries to suggest pulmonary emboli. Mediastinum/Nodes: No mediastinal, hilar, or axillary adenopathy. Trachea and esophagus are unremarkable. Thyroid  unremarkable. Lungs/Pleura: Biapical scarring. No confluent opacities or effusions. Upper Abdomen: No acute findings. Gallstones within the gallbladder. Musculoskeletal: Chest wall soft tissues are unremarkable. No acute bony  abnormality. Review of the MIP images confirms the above findings. IMPRESSION: No evidence of pulmonary embolus. No acute cardiopulmonary disease. Cholelithiasis. Electronically Signed   By: Franky Crease M.D.   On: 11/26/2023 18:04     Procedures   Medications Ordered in the ED  ketorolac  (TORADOL ) 15 MG/ML injection 15 mg (15 mg Intravenous Not Given 11/26/23 1706)  iohexol  (OMNIPAQUE ) 350 MG/ML injection 75 mL (61 mLs Intravenous Contrast Given 11/26/23 1739)                                    Medical Decision Making Amount and/or Complexity of Data Reviewed Labs: ordered. Radiology: ordered.  Risk Prescription drug management.   BP 130/79   Pulse 60   Temp 98.2 F (36.8 C)   Resp 18   LMP 06/20/2017   SpO2 100%   74:43 PM  58 year old female history of uterine fibroid, factor V  Leiden, degenerative disc disease, lupus, hypertension presenting complaining of back pain.  Patient states for the past several weeks she has had pain to her back that radiates towards the chest.  Pain is waxing waning, sharp stabbing sometimes can be intense.  She also feel fatigue having some occasional bouts of shortness of breath.  She mention she was seen at the urgent care center recently for her complaint.  An x-ray was done at that time and she was told that she has a abnormal nodule on the x-ray that needs a CT scan.  Despite waiting for the CT scan she has not received a CT scan yet which concerns her.  She denies any prior history of a PE DVT she denies productive cough or hemoptysis no fever or chills no abdominal pain no dysuria no numbness or weakness.  She has been taking Tylenol  at home without adequate relief.  Exam notable for no midline spine tenderness.  Abdomen is soft nontender, chest wall nontender.  Vitals are notable for elevated blood pressure of 171/91.  Workup initiated.  -Labs ordered, independently viewed and interpreted by me.  Labs remarkable for CBG 154 -The patient was  maintained on a cardiac monitor.  I personally viewed and interpreted the cardiac monitored which showed an underlying rhythm of: NSR -Imaging independently viewed and interpreted by me and I agree with radiologist's interpretation.  Result remarkable for chest CTA showing no PE.  Gallstone were noted in gallbladder -This patient presents to the ED for concern of back pain and chest pain, this involves an extensive number of treatment options, and is a complaint that carries with it a high risk of complications and morbidity.  The differential diagnosis includes PE, dissection, gastritis, MSK, cholecystitis, shingles -Co morbidities that complicate the patient evaluation includes factor v leiden, uterine fibroid, lupus, HTN -Treatment includes toradol  -Reevaluation of the patient after these medicines showed that the patient improved -PCP office notes or outside notes reviewed -Escalation to admission/observation considered: patients feels much better, is comfortable with discharge, and will follow up with PCP -Prescription medication considered, patient comfortable with OTC meds -Social Determinant of Health considered which includes lack of activity  Patient has evidence of gallstone but she does not have any right upper quadrant tenderness to suggest acute cholecystitis.  Will provide patient with referral to general surgery for outpatient follow-up as needed.  Return precaution given.  Patient request for IV fluid, IV fluid given.      Final diagnoses:  Midline low back pain without sciatica, unspecified chronicity  Calculus of gallbladder without cholecystitis without obstruction    ED Discharge Orders          Ordered    cyclobenzaprine  (FLEXERIL ) 5 MG tablet  At bedtime PRN        11/26/23 1835               Nivia Colon, PA-C 11/26/23 1906    Pamella Ozell LABOR, DO 12/06/23 1220

## 2023-11-26 NOTE — ED Triage Notes (Signed)
 Pt reports right lower back pain that radiates up her back and around her right abdomen, up through her central chest.   Pt seen in UC for same  2 weeks ago and sx have been radiating and worsening further.  Sx now also include SOB.

## 2024-01-18 DIAGNOSIS — K8020 Calculus of gallbladder without cholecystitis without obstruction: Secondary | ICD-10-CM | POA: Diagnosis not present

## 2024-02-06 DIAGNOSIS — M79671 Pain in right foot: Secondary | ICD-10-CM | POA: Diagnosis not present

## 2024-02-06 DIAGNOSIS — B351 Tinea unguium: Secondary | ICD-10-CM | POA: Diagnosis not present

## 2024-02-06 DIAGNOSIS — M79672 Pain in left foot: Secondary | ICD-10-CM | POA: Diagnosis not present

## 2024-03-15 ENCOUNTER — Encounter: Admitting: Family Medicine

## 2024-04-29 ENCOUNTER — Encounter: Admitting: Family
